# Patient Record
Sex: Female | Born: 1970 | Race: White | Hispanic: No | Smoking: Never smoker
Health system: Southern US, Community
[De-identification: ages and names within clinical notes are randomized; demographics above are authoritative.]

## PROBLEM LIST (undated history)

## (undated) DIAGNOSIS — R519 Headache, unspecified: Secondary | ICD-10-CM

## (undated) DIAGNOSIS — M542 Cervicalgia: Secondary | ICD-10-CM

## (undated) DIAGNOSIS — M5430 Sciatica, unspecified side: Secondary | ICD-10-CM

## (undated) DIAGNOSIS — R51 Headache: Secondary | ICD-10-CM

## (undated) DIAGNOSIS — G8929 Other chronic pain: Secondary | ICD-10-CM

## (undated) DIAGNOSIS — E119 Type 2 diabetes mellitus without complications: Secondary | ICD-10-CM

## (undated) DIAGNOSIS — J45909 Unspecified asthma, uncomplicated: Secondary | ICD-10-CM

## (undated) DIAGNOSIS — E079 Disorder of thyroid, unspecified: Secondary | ICD-10-CM

## (undated) DIAGNOSIS — M549 Dorsalgia, unspecified: Secondary | ICD-10-CM

## (undated) HISTORY — PX: TUBAL LIGATION: SHX77

## (undated) HISTORY — PX: CHOLECYSTECTOMY: SHX55

---

## 1998-08-13 ENCOUNTER — Emergency Department (HOSPITAL_COMMUNITY): Admission: EM | Admit: 1998-08-13 | Discharge: 1998-08-13 | Payer: Self-pay | Admitting: Emergency Medicine

## 1998-09-01 ENCOUNTER — Emergency Department (HOSPITAL_COMMUNITY): Admission: EM | Admit: 1998-09-01 | Discharge: 1998-09-01 | Payer: Self-pay | Admitting: Emergency Medicine

## 1998-11-08 ENCOUNTER — Emergency Department (HOSPITAL_COMMUNITY): Admission: EM | Admit: 1998-11-08 | Discharge: 1998-11-08 | Payer: Self-pay | Admitting: Emergency Medicine

## 1999-12-26 ENCOUNTER — Emergency Department (HOSPITAL_COMMUNITY): Admission: EM | Admit: 1999-12-26 | Discharge: 1999-12-26 | Payer: Self-pay | Admitting: Emergency Medicine

## 2000-01-28 ENCOUNTER — Emergency Department (HOSPITAL_COMMUNITY): Admission: EM | Admit: 2000-01-28 | Discharge: 2000-01-28 | Payer: Self-pay | Admitting: Emergency Medicine

## 2000-01-28 ENCOUNTER — Encounter: Payer: Self-pay | Admitting: Emergency Medicine

## 2000-03-11 ENCOUNTER — Other Ambulatory Visit: Admission: RE | Admit: 2000-03-11 | Discharge: 2000-03-11 | Payer: Self-pay | Admitting: Obstetrics and Gynecology

## 2000-04-22 ENCOUNTER — Encounter: Payer: Self-pay | Admitting: Obstetrics and Gynecology

## 2000-04-22 ENCOUNTER — Ambulatory Visit (HOSPITAL_COMMUNITY): Admission: RE | Admit: 2000-04-22 | Discharge: 2000-04-22 | Payer: Self-pay | Admitting: Obstetrics and Gynecology

## 2000-05-01 ENCOUNTER — Encounter: Payer: Self-pay | Admitting: Obstetrics and Gynecology

## 2000-05-01 ENCOUNTER — Ambulatory Visit (HOSPITAL_COMMUNITY): Admission: RE | Admit: 2000-05-01 | Discharge: 2000-05-01 | Payer: Self-pay | Admitting: Obstetrics and Gynecology

## 2000-07-22 ENCOUNTER — Inpatient Hospital Stay (HOSPITAL_COMMUNITY): Admission: AD | Admit: 2000-07-22 | Discharge: 2000-07-22 | Payer: Self-pay | Admitting: Obstetrics and Gynecology

## 2000-07-23 ENCOUNTER — Encounter: Payer: Self-pay | Admitting: Obstetrics and Gynecology

## 2000-07-23 ENCOUNTER — Inpatient Hospital Stay (HOSPITAL_COMMUNITY): Admission: AD | Admit: 2000-07-23 | Discharge: 2000-07-23 | Payer: Self-pay | Admitting: Obstetrics and Gynecology

## 2000-08-02 ENCOUNTER — Inpatient Hospital Stay (HOSPITAL_COMMUNITY): Admission: AD | Admit: 2000-08-02 | Discharge: 2000-08-02 | Payer: Self-pay | Admitting: Obstetrics and Gynecology

## 2000-08-08 ENCOUNTER — Inpatient Hospital Stay (HOSPITAL_COMMUNITY): Admission: AD | Admit: 2000-08-08 | Discharge: 2000-08-08 | Payer: Self-pay | Admitting: Obstetrics and Gynecology

## 2000-08-17 ENCOUNTER — Inpatient Hospital Stay (HOSPITAL_COMMUNITY): Admission: AD | Admit: 2000-08-17 | Discharge: 2000-08-17 | Payer: Self-pay | Admitting: Obstetrics and Gynecology

## 2000-08-21 ENCOUNTER — Inpatient Hospital Stay (HOSPITAL_COMMUNITY): Admission: AD | Admit: 2000-08-21 | Discharge: 2000-08-21 | Payer: Self-pay | Admitting: Obstetrics and Gynecology

## 2000-08-30 ENCOUNTER — Inpatient Hospital Stay (HOSPITAL_COMMUNITY): Admission: AD | Admit: 2000-08-30 | Discharge: 2000-08-30 | Payer: Self-pay | Admitting: Obstetrics and Gynecology

## 2000-09-03 ENCOUNTER — Other Ambulatory Visit: Admission: RE | Admit: 2000-09-03 | Discharge: 2000-09-03 | Payer: Self-pay | Admitting: Obstetrics and Gynecology

## 2000-09-06 ENCOUNTER — Inpatient Hospital Stay (HOSPITAL_COMMUNITY): Admission: AD | Admit: 2000-09-06 | Discharge: 2000-09-06 | Payer: Self-pay | Admitting: Obstetrics and Gynecology

## 2000-09-08 ENCOUNTER — Inpatient Hospital Stay (HOSPITAL_COMMUNITY): Admission: AD | Admit: 2000-09-08 | Discharge: 2000-09-08 | Payer: Self-pay | Admitting: Obstetrics and Gynecology

## 2000-09-13 ENCOUNTER — Inpatient Hospital Stay (HOSPITAL_COMMUNITY): Admission: AD | Admit: 2000-09-13 | Discharge: 2000-09-16 | Payer: Self-pay | Admitting: Obstetrics and Gynecology

## 2000-10-30 ENCOUNTER — Other Ambulatory Visit: Admission: RE | Admit: 2000-10-30 | Discharge: 2000-10-30 | Payer: Self-pay | Admitting: Obstetrics and Gynecology

## 2000-12-17 ENCOUNTER — Emergency Department (HOSPITAL_COMMUNITY): Admission: EM | Admit: 2000-12-17 | Discharge: 2000-12-17 | Payer: Self-pay | Admitting: Emergency Medicine

## 2001-05-04 ENCOUNTER — Other Ambulatory Visit: Admission: RE | Admit: 2001-05-04 | Discharge: 2001-05-04 | Payer: Self-pay | Admitting: Obstetrics and Gynecology

## 2001-06-24 ENCOUNTER — Encounter: Payer: Self-pay | Admitting: Obstetrics and Gynecology

## 2001-06-24 ENCOUNTER — Ambulatory Visit (HOSPITAL_COMMUNITY): Admission: RE | Admit: 2001-06-24 | Discharge: 2001-06-24 | Payer: Self-pay | Admitting: Obstetrics and Gynecology

## 2001-10-04 ENCOUNTER — Inpatient Hospital Stay (HOSPITAL_COMMUNITY): Admission: AD | Admit: 2001-10-04 | Discharge: 2001-10-04 | Payer: Self-pay | Admitting: Obstetrics and Gynecology

## 2001-10-14 ENCOUNTER — Inpatient Hospital Stay (HOSPITAL_COMMUNITY): Admission: AD | Admit: 2001-10-14 | Discharge: 2001-10-14 | Payer: Self-pay | Admitting: Obstetrics and Gynecology

## 2001-10-31 ENCOUNTER — Inpatient Hospital Stay (HOSPITAL_COMMUNITY): Admission: AD | Admit: 2001-10-31 | Discharge: 2001-11-03 | Payer: Self-pay | Admitting: Obstetrics and Gynecology

## 2001-12-08 ENCOUNTER — Other Ambulatory Visit: Admission: RE | Admit: 2001-12-08 | Discharge: 2001-12-08 | Payer: Self-pay | Admitting: Obstetrics and Gynecology

## 2002-01-15 ENCOUNTER — Ambulatory Visit (HOSPITAL_COMMUNITY): Admission: RE | Admit: 2002-01-15 | Discharge: 2002-01-15 | Payer: Self-pay | Admitting: Obstetrics and Gynecology

## 2002-02-01 ENCOUNTER — Emergency Department (HOSPITAL_COMMUNITY): Admission: EM | Admit: 2002-02-01 | Discharge: 2002-02-01 | Payer: Self-pay | Admitting: Emergency Medicine

## 2002-03-04 ENCOUNTER — Emergency Department (HOSPITAL_COMMUNITY): Admission: EM | Admit: 2002-03-04 | Discharge: 2002-03-04 | Payer: Self-pay | Admitting: Emergency Medicine

## 2002-05-02 ENCOUNTER — Emergency Department (HOSPITAL_COMMUNITY): Admission: EM | Admit: 2002-05-02 | Discharge: 2002-05-03 | Payer: Self-pay | Admitting: Emergency Medicine

## 2002-10-13 ENCOUNTER — Emergency Department (HOSPITAL_COMMUNITY): Admission: EM | Admit: 2002-10-13 | Discharge: 2002-10-14 | Payer: Self-pay | Admitting: Emergency Medicine

## 2002-10-14 ENCOUNTER — Encounter: Payer: Self-pay | Admitting: Emergency Medicine

## 2003-06-25 ENCOUNTER — Emergency Department (HOSPITAL_COMMUNITY): Admission: EM | Admit: 2003-06-25 | Discharge: 2003-06-25 | Payer: Self-pay

## 2003-06-26 ENCOUNTER — Emergency Department (HOSPITAL_COMMUNITY): Admission: EM | Admit: 2003-06-26 | Discharge: 2003-06-26 | Payer: Self-pay

## 2003-11-10 ENCOUNTER — Emergency Department (HOSPITAL_COMMUNITY): Admission: EM | Admit: 2003-11-10 | Discharge: 2003-11-10 | Payer: Self-pay | Admitting: Emergency Medicine

## 2003-11-12 ENCOUNTER — Emergency Department (HOSPITAL_COMMUNITY): Admission: EM | Admit: 2003-11-12 | Discharge: 2003-11-12 | Payer: Self-pay | Admitting: Emergency Medicine

## 2004-01-07 ENCOUNTER — Emergency Department (HOSPITAL_COMMUNITY): Admission: EM | Admit: 2004-01-07 | Discharge: 2004-01-07 | Payer: Self-pay | Admitting: *Deleted

## 2004-04-27 ENCOUNTER — Emergency Department (HOSPITAL_COMMUNITY): Admission: EM | Admit: 2004-04-27 | Discharge: 2004-04-27 | Payer: Self-pay | Admitting: Emergency Medicine

## 2005-04-05 ENCOUNTER — Emergency Department (HOSPITAL_COMMUNITY): Admission: EM | Admit: 2005-04-05 | Discharge: 2005-04-05 | Payer: Self-pay | Admitting: Emergency Medicine

## 2005-10-18 ENCOUNTER — Emergency Department (HOSPITAL_COMMUNITY): Admission: EM | Admit: 2005-10-18 | Discharge: 2005-10-18 | Payer: Self-pay | Admitting: Emergency Medicine

## 2005-12-12 ENCOUNTER — Emergency Department (HOSPITAL_COMMUNITY): Admission: EM | Admit: 2005-12-12 | Discharge: 2005-12-12 | Payer: Self-pay | Admitting: Emergency Medicine

## 2006-02-10 ENCOUNTER — Emergency Department (HOSPITAL_COMMUNITY): Admission: EM | Admit: 2006-02-10 | Discharge: 2006-02-10 | Payer: Self-pay | Admitting: Emergency Medicine

## 2007-03-27 ENCOUNTER — Emergency Department (HOSPITAL_COMMUNITY): Admission: EM | Admit: 2007-03-27 | Discharge: 2007-03-27 | Payer: Self-pay | Admitting: Emergency Medicine

## 2007-05-09 ENCOUNTER — Emergency Department (HOSPITAL_COMMUNITY): Admission: EM | Admit: 2007-05-09 | Discharge: 2007-05-10 | Payer: Self-pay | Admitting: Emergency Medicine

## 2007-07-09 ENCOUNTER — Emergency Department (HOSPITAL_COMMUNITY): Admission: EM | Admit: 2007-07-09 | Discharge: 2007-07-10 | Payer: Self-pay | Admitting: Emergency Medicine

## 2007-07-11 ENCOUNTER — Emergency Department (HOSPITAL_COMMUNITY): Admission: EM | Admit: 2007-07-11 | Discharge: 2007-07-11 | Payer: Self-pay | Admitting: Emergency Medicine

## 2007-07-19 ENCOUNTER — Emergency Department (HOSPITAL_COMMUNITY): Admission: EM | Admit: 2007-07-19 | Discharge: 2007-07-19 | Payer: Self-pay | Admitting: Emergency Medicine

## 2008-06-20 ENCOUNTER — Emergency Department (HOSPITAL_COMMUNITY): Admission: EM | Admit: 2008-06-20 | Discharge: 2008-06-20 | Payer: Self-pay | Admitting: Emergency Medicine

## 2010-05-08 LAB — URINALYSIS, ROUTINE W REFLEX MICROSCOPIC
Hgb urine dipstick: NEGATIVE
Specific Gravity, Urine: 1.023 (ref 1.005–1.030)
Urobilinogen, UA: 0.2 mg/dL (ref 0.0–1.0)
pH: 6 (ref 5.0–8.0)

## 2010-05-08 LAB — URINE MICROSCOPIC-ADD ON

## 2010-05-08 LAB — DIFFERENTIAL
Basophils Absolute: 0 10*3/uL (ref 0.0–0.1)
Basophils Relative: 0 % (ref 0–1)
Eosinophils Absolute: 0.2 10*3/uL (ref 0.0–0.7)
Eosinophils Relative: 2 % (ref 0–5)
Lymphs Abs: 2.8 10*3/uL (ref 0.7–4.0)
Neutrophils Relative %: 61 % (ref 43–77)

## 2010-05-08 LAB — COMPREHENSIVE METABOLIC PANEL
ALT: 14 U/L (ref 0–35)
AST: 23 U/L (ref 0–37)
Alkaline Phosphatase: 70 U/L (ref 39–117)
CO2: 21 mEq/L (ref 19–32)
Calcium: 8.5 mg/dL (ref 8.4–10.5)
Chloride: 107 mEq/L (ref 96–112)
GFR calc Af Amer: >60 mL/min (ref >60)
GFR calc non Af Amer: >60 mL/min (ref >60)
Glucose, Bld: 106 mg/dL — ABNORMAL HIGH (ref 70–99)
Potassium: 3.9 mEq/L (ref 3.5–5.1)
Sodium: 137 mEq/L (ref 135–145)
Total Bilirubin: 0.5 mg/dL (ref 0.3–1.2)

## 2010-05-08 LAB — CBC
Hemoglobin: 11.8 g/dL — ABNORMAL LOW (ref 12.0–15.0)
MCHC: 33.8 g/dL (ref 30.0–36.0)
RBC: 3.88 MIL/uL (ref 3.87–5.11)
WBC: 8.7 10*3/uL (ref 4.0–10.5)

## 2010-05-08 LAB — URINE CULTURE

## 2010-05-08 LAB — POCT PREGNANCY, URINE: Preg Test, Ur: NEGATIVE

## 2010-05-08 LAB — POCT CARDIAC MARKERS: Troponin i, poc: <0.05 ng/mL (ref 0.00–0.09)

## 2010-06-15 NOTE — Discharge Summary (Signed)
[name], [name] NO.:  [id number]   MEDICAL RECORD NO.:  [id number]                   PATIENT TYPE:  INP   LOCATION:  9137                                 FACILITY:  [facility]   PHYSICIAN:  Malachi Pro. Ambrose Mantle, M.D.              DATE OF BIRTH:  05-Apr-1970   DATE OF ADMISSION:  10/31/2001  DATE OF DISCHARGE:  11/03/2001                                 DISCHARGE SUMMARY   HISTORY OF PRESENT ILLNESS:  A 40 year old white female para 2-1-1-2,  gravida 5.  Estimated gestational age [redacted] weeks by 9 week ultrasound with [facility]  November 21, 2001 presented complaining of contractions and bloody show.  Blood group and type O+ with a negative antibody.  RPR nonreactive.  Rubella  immune.  Hepatitis B surface antigen negative.  HIV negative.  GC negative.  Chlamydia positive.  Triple screen normal.  One hour Glucola 35.  Group B  Strep positive.  The patient's prenatal course is outlined in the history  and physical.  After admission to the hospital she was given IV Zithromax  for positive Chlamydia that she had never treated.  She was then begun on  penicillin for positive group B Strep status.  At 1:15 p.m. on October 31, 2001 the cervix was 7 cm, right mentum posterior presentation.  Artificial  rupture of the membranes produced copious clear fluid.  By 2:15 p.m. the  heart rate showed severe variables with contractions.  Vaginal examination  was unchanged.  Amnioinfusion was done.  Epidural was given.  By 4:55 p.m.  the cervix was completely dilated since 3:30 p.m.  The cervix was 10 cm.  The mentum was still posterior.  It did not change with pushing.  Dr.  Jackelyn Knife proceeded to cesarean section and noticed a ballooning lower  uterine segment.  Right lower uterus was adherent to the anterior abdominal  wall.  The infant was 6 pounds 4 ounces, female, Apgars of 8 at one and 9 at  five minutes.  Postpartum the patient did well.  Her hemoglobin did drop  from 11.8 to 7.6  but it remained stable and she was asymptomatic.  She was  seen in consultation by social work who felt the patient had a complicated  home situation, but no real needs to be addressed during the hospital stay.  On the third postpartum day the patient was doing well.  Her abdomen was  soft and nontender.  She was passing flatus, ambulating well without  difficulty.  Staples removed.  Strips were applied and she was ready for  discharge.   LABORATORY DATA:  Hemoglobin 11.8, hematocrit 34.2, white count 8300,  platelet count 299,000 on admission.  Follow-up hemoglobin 7.6 and 7.8.  RPR  was nonreactive.   FINAL DIAGNOSES:  1. Intrauterine pregnancy 37 weeks.  2. Right mentum posterior.  3. Positive Chlamydia.  4. Positive group  B Strep.   OPERATION:  Low transverse cervical cesarean section.   FINAL CONDITION:  Improved.   DISCHARGE INSTRUCTIONS:  Regular discharge instruction booklet.  The patient  is given prescriptions for Chromagen one tablet q.d., Percocet 5/325 20  tablets one q.4-6h. as needed for pain.  She is advised to return to the  office in 10 days for follow-up examination.  She also had an abnormal Pap  smear during the pregnancy that could not be evaluated adequately with  colposcopy because the cervix was difficult to visualize in the right  anterior fornix.                                              Malachi Pro. Ambrose Mantle, M.D.   TFH/MEDQ  D:  11/03/2001  T:  11/03/2001  Job:  [id number]

## 2010-06-15 NOTE — Discharge Summary (Signed)
[facility]  PatientCOLA, [name] Visit Number: [id number] MRN: [id number]          Service Type: OBS Location: 910A 9141 [id number] Attending Physician:  Michaele Offer Adm. Date:  09/13/2000 Disc. Date: 09/16/00                             Discharge Summary  ADMISSION DIAGNOSES:          1. Intrauterine pregnancy at 39 weeks.                               2. Prior cesarean section.                               3. Group B streptococcus carrier.                               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  DISCHARGE DIAGNOSES:          1. Intrauterine pregnancy at 39 weeks.                               2. Prior cesarean section.                               3. Group B streptococcus carrier.                               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  PROCEDURES:                   Vacuum assisted vaginal birth after                               cesarean.  COMPLICATIONS:                None.  CONSULTATIONS:                None.  HISTORY AND PHYSICAL:         This is a 40 year old white female, gravida 4, para 1-1-1-2, with an EGA of [redacted] weeks by a 10-week ultrasound with a due date of August 23 who presented with a complaint of a mucous discharge and contractions without bleeding and with good fetal movement. Evaluation in Maternity Admissions revealed positive Nitrazine x 2 per the nurse consistent with ruptured membranes and she was admitted, at which time her vaginal exam was 1+/50/-2 with a vertex presentation. Prenatal care was complicated by positive Chlamydia treated with Zithromax with a negative test of cure. She had an abnormal Pap smear with CIN-1 by colposcopy. She had cystitis at 18 weeks treated with Macrobid and was a positive cystic fibrosis carrier by screening and the father of the baby declined testing. She had normal three-hour glucose tolerance test x 2 for a history of gestational diabetes. She did have group B strep  in her urine at 28 weeks treated with Macrobid and penicillin and was retreated with this at 34 weeks with ampicillin. She had yeast at 28 weeks treated  with Terazol and was seen in the emergency room at 30 weeks for right lower quadrant pain for which no source was found. She has had frequent trips to Maternity Admissions for preterm and term contractions.  PRENATAL LABORATORY DATA:     Blood type is O positive with a negative antibody screen, RPR nonreactive, rubella nonimmune, hepatitis B surface antigen  negative, HIV negative, gonorrhea negative, group B strep was positive.  PAST OBSTETRICAL HISTORY:     In March of 1992, vaginal delivery at 36 weeks. The baby weighed 2 pounds and died at approximately five hours of life from a possible chromosomal abnormality. Later in 1992, she had a spontaneous abortion treated with a D&C. In 1993 she had a low transverse cesarean section at 40 weeks for failure to progress and gestational diabetes and the baby weighed 8 pounds 9 ounces.  PAST SURGICAL HISTORY:        Cesarean section, myringotomy with tubes, and cholecystectomy.  PHYSICAL EXAMINATION:         She was afebrile with stable vital signs. Fetal heart rate was reassuring and she developed regular contractions. The abdomen was gravid, nontender, with a fundal height of 37 cm a week prior to admission, and she has a well-healed transverse scar. Admission vaginal exam, again, was 1+/50/-2.  HOSPITAL COURSE:              The patient was admitted with what we felt was ruptured membranes in early labor, possibly. She was started on penicillin for being a group B strep carrier. She continued to contract on her own. Even though a nurse on labor and delivery palpated a bulging bag of water, she did enter labor on her own. She entered active labor and received an epidural. She also had spontaneous rupture of membranes with blood-tinged fluid and continued to progress. On my first exam she  was complete/complete/0. She pushed fair but the fetal heart rate tracing went down to the 70s to 80s for approximately two minutes with each push. At that time, she had pushed the baby to a +2 station and assisted delivery was recommended. Vacuum assisted vaginal delivery was performed without complications. She delivered a viable female with Apgars of 8 and 9 that weighed 5 pounds 12 ounces over an intact perineum. Placenta delivered spontaneous and was intact. She had bilateral labial lacerations and a small vaginal laceration which were hemostatic and not repaired. Her C-section scar palpated intact and estimated blood loss was less than 500 cc.  Postpartum she did very well, breast fed her baby without complications, and remained afebrile. Predelivery hemoglobin 12.9, postdelivery 10.2.  On the morning of postpartum day #2 she was felt to be stable enough for discharge home.  CONDITION ON DISCHARGE:       Stable.  DISPOSITION:                  Discharge to home.  DISCHARGE INSTRUCTIONS:       Diet: Regular. Activity: Pelvic rest. She is given our discharge pamphlet.  DISCHARGE FOLLOWUP:           The patient is to follow up in four to six weeks.  DISCHARGE MEDICATIONS:        Percocet p.r.n. as well as over-the-counter Motrin or Aleve p.r.n. Attending Physician:  Michaele Offer DD:  09/16/00 TD:  09/16/00 Job: [phone] [id number]

## 2010-06-15 NOTE — Op Note (Signed)
[name], [facility]                                  MRN:  [id number]           Operative Report                                  ATT:  Zenaida Niece, M.D.  Procedure:  09/14/00             DICT: PROCEDURE IN DETAIL:          This patient progressed through labor and reached completely dilated. She pushed for approximately 30 minutes. She was pushing fair. With each push, the fetal heart rate would decelerate to the 70s to 80s for approximately two minutes and would recover just in time for the next push. Due to these decelerations, I discussed the delivery with the patient and offered a vacuum delivery. Risks of the procedure were discussed and the patient agreed to proceed. Foley catheter had been removed approximately 20 minutes prior so her bladder was deemed to be empty. The fetal vertex was at +2 to +3 station and I was not able to determine the position. She had adequate epidural anesthesia. The M-cup vacuum was applied and placement confirmed. With her next contraction, with three to four pushes, we were able to deliver the vertex over an intact perineum. At this point, the vacuum was removed and the mouth and nares were suctioned. There was noted to be terminal meconium. She then delivered the remainder of a viable female infant with Apgars of 8 and 9 with a weight pending at the time of dictation. Again, the baby cried spontaneously and there appeared to be very dilute terminal meconium. This was accomplished over an intact perineum. The placenta then delivered spontaneous and was intact. Her uterus was palpated and her previous cesarean scar was found to be intact. Pitocin was then started. Bilateral labial lacerations and a small vaginal laceration were hemostatic and not repaired. Estimated blood loss was less than 500 cc. Baby and mom were doing well at the end of the procedure. DD:  09/14/00 TD:  09/15/00 Job:  [id number]

## 2010-06-15 NOTE — Op Note (Signed)
[name], [name]                              ACCOUNT NO.:  [id number]   MEDICAL RECORD NO.:  [id number]                   PATIENT TYPE:  AMB   LOCATION:  DAY                                  FACILITY:  [facility]   PHYSICIAN:  Zenaida Niece, M.D.             DATE OF BIRTH:  1970-12-31   DATE OF PROCEDURE:  01/15/2002  DATE OF DISCHARGE:                                 OPERATIVE REPORT   PREOPERATIVE DIAGNOSIS:  Desires surgical sterility.   POSTOPERATIVE DIAGNOSES:  1. Desires surgical sterility.  2. Pelvic adhesions.   PROCEDURE:  Laparoscopic bilateral tubal fulguration and adhesiolysis.   SURGEON:  Zenaida Niece, M.D.   ANESTHESIA:  General endotracheal tube.   ESTIMATED BLOOD LOSS:  Less than 50 cc.   FINDINGS:  Very short anterior cervix.  The uterus was adherent to the  anterior abdominal wall with a thick adhesion.  The right ovary was slightly  enlarged with the tube adherent to it.  Left tube and ovary appeared normal.  The omentum was adherent to the anterior abdominal wall in several places.   PROCEDURE IN DETAIL:  The patient was taken to the operating room and placed  in the dorsal supine position.  General anesthesia was induced, and she was  placed in mobile stirrups.  The abdomen was prepped and draped in the usual  sterile fashion, and I attempted to place a Hulka tenaculum on the cervix  but due to the fact that it was so short, I was unable to place this.  A  sponge stick was placed in the vagina for possible uterine manipulation.  Her infraumbilical skin was then infiltrated with 0.25% Marcaine, and a 1.5  cm horizontal incision was made.  The Veress needle was inserted in the  peritoneal cavity and placement confirmed by an opening pressure of 2 mmHg.  The abdomen was insufflated with CO2 gas, and the Veress needle was removed.  The 10-11 disposable trocar was then introduced and placement confirmed by  the laparoscope.  Inspection revealed the  above-mentioned findings.  Some of  the omental adhesions were in the midline going down to the midline uterine  adhesion and also to the left of it.  These were taken down bluntly and with  bipolar cautery to get blood vessels and scissors for sharp dissection.  This adhesion was taken down entirely from the umbilicus to the lower  pelvis.  The right fallopian tube was identified and traced to its  fimbriated end.  It was noted to be significantly adherent to the right  ovary.  Three segments of tube were desiccated with bipolar cautery to  achieve tubal fulguration with adequate hemostasis and adequate dessication  where the amp meter read zero at all spots desiccated.  The left fallopian  tube, which was posterior, was identified and traced to its fimbriated end.  Three segments of this tube  were fulgurated with bipolar cautery until the  amp meter read zero.  This appeared to achieve good dessication of the tube.  The anterior adhesions of the uterus to the anterior abdominal wall was  taken down somewhat bluntly and somewhat with bipolar cautery.  However, it  proved to be fairly thick and due to the fact that she was having no  symptoms and it looked like this might bleed significantly, I only took it  down a short ways.  Bleeding was controlled with bipolar cautery.  The  pelvis was copiously irrigated and several bleeding areas made hemostatic  with the bipolar electrocautery.  I had irrigated the pelvis with  approximately 1500 cc of fluid.  I was able to aspirate approximately 750 cc  to 800 cc of it back out through the suction irrigator.  The pelvis was  again inspected and all sites found to be hemostatic.  She also had a normal-  appearing appendix.  All gas was allowed to deflate from the abdomen, and  the trocar was removed.  The skin was closed with interrupted subcuticular  sutures of 4-0 Vicryl followed by Steri-Strips and band-aid.  The patient  was extubated in the  operating room and tolerated the procedure well and was  taken to the recovery room in stable condition.                                               Zenaida Niece, M.D.    TDM/MEDQ  D:  01/15/2002  T:  01/16/2002  Job:  [id number]

## 2010-06-15 NOTE — Op Note (Signed)
NAMEHAILLEE, [name] NO.:  [id number]   MEDICAL RECORD NO.:  [id number]                   PATIENT TYPE:  INP   LOCATION:  9137                                 FACILITY:  [facility]   PHYSICIAN:  Zenaida Niece, M.D.             DATE OF BIRTH:  1970/03/29   DATE OF PROCEDURE:  10/31/2001  DATE OF DISCHARGE:                                 OPERATIVE REPORT   PREOPERATIVE DIAGNOSES:  1. Intrauterine pregnancy at 37 weeks.  2. Face presentation.  3. Arrest of descent.  4. Prior cesarean section.   POSTOPERATIVE DIAGNOSES:  1. Intrauterine pregnancy at 37 weeks.  2. Face presentation.  3. Arrest of descent.  4. Prior cesarean section.   PROCEDURE:  Repeat low transverse cesarean section.   SURGEON:  Zenaida Niece, M.D.   ASSISTANT:  Chester Holstein. Earlene Plater, M.D.   ANESTHESIA:  Epidural.   ESTIMATED BLOOD LOSS:  800 cc.   FINDINGS:  She had a ballooning lower uterine segment and the right lower  uterus was adherent to the anterior abdominal wall.  She delivered a viable  female infant with Apgars of 8 and 9, weight 6 pounds 4 ounces.   DESCRIPTION OF PROCEDURE:  The patient was taken to the operating room and  placed in the dorsal supine position.  Her previously placed epidural was  dosed appropriately, and her abdomen was prepped and draped in the usual  sterile fashion.  The level of her anesthesia was found to be adequate and  her abdomen was entered via her previous Pfannenstiel incision.  There was  evidence of scarring.  Upon entering the peritoneal cavity, the right lower  uterus was adherent to the anterior abdominal wall.  Some of this was taken  down with electrocautery but I was able to place the bladder blade and  create a bladder flap without taking down the rest of this adhesion.  Again,  the lower uterine segment was noted to be ballooning and a 4 cm transverse  incision was made in this.  The incision was extended bilaterally  digitally  and for a short distance with bandage scissors.  The fetal vertex was  grasped and delivered through the incision atraumatically.  There was  swelling noted from the face presentation.  The mouth and nares were  suctioned, and the remainder of the infant was delivered atraumatically.  The cord was then doubly clamped and cut and the infant handed to the  awaiting pediatric team.  Cord blood and a segment of cord for gas were  obtained, and the placenta delivered spontaneously.  The uterus was wiped  dry with  clean lap pad and all clots and debris removed.  The uterus was  inspected and found to have no extensions although the incision did go out  laterally on the right side.  The uterus was then closed in one layer  being  a running interlocking layer with #1 chromic.  This started at both angles  and worked towards the middle.  Bleeding from the inferior portion on the  left side was controlled with several figure-of-eight sutures of #1 chromic  and 3-0 Vicryl to eventually achieve hemostasis.  Both tubes and ovaries  were palpated normal.  The adhesions on the uterus to the right anterior  abdominal wall was not taken down.  The subfascial space was irrigated and  made hemostatic with electrocautery.  The fascia was then closed in a  running fashion starting at both ends and meeting in the middle with 0  Vicryl.  The subcutaneous tissue was irrigated and made hemostatic with  electrocautery and the skin was closed with staples and as sterile dressing.  The patient tolerated the procedure well and was taken to the recovery room  in stable condition.  The patient was given Ancef 1 g after cord clamping.  Counts were correct.                                               Zenaida Niece, M.D.    TDM/MEDQ  D:  10/31/2001  T:  11/01/2001  Job:  [id number]

## 2010-07-02 ENCOUNTER — Emergency Department (HOSPITAL_COMMUNITY): Payer: Self-pay

## 2010-07-02 ENCOUNTER — Emergency Department (HOSPITAL_COMMUNITY)
Admission: EM | Admit: 2010-07-02 | Discharge: 2010-07-02 | Disposition: A | Discharge status: Home / Self Care | Payer: Self-pay | Attending: Emergency Medicine | Admitting: Emergency Medicine

## 2010-07-02 DIAGNOSIS — M79609 Pain in unspecified limb: Secondary | ICD-10-CM | POA: Insufficient documentation

## 2010-07-02 DIAGNOSIS — M7989 Other specified soft tissue disorders: Secondary | ICD-10-CM | POA: Insufficient documentation

## 2010-09-05 ENCOUNTER — Emergency Department (HOSPITAL_COMMUNITY)
Admission: EM | Admit: 2010-09-05 | Discharge: 2010-09-05 | Disposition: A | Discharge status: Home / Self Care | Payer: Self-pay | Attending: Emergency Medicine | Admitting: Emergency Medicine

## 2010-09-05 DIAGNOSIS — K029 Dental caries, unspecified: Secondary | ICD-10-CM | POA: Insufficient documentation

## 2010-09-05 DIAGNOSIS — K089 Disorder of teeth and supporting structures, unspecified: Secondary | ICD-10-CM | POA: Insufficient documentation

## 2010-10-13 ENCOUNTER — Emergency Department (HOSPITAL_COMMUNITY): Payer: Self-pay

## 2010-10-13 ENCOUNTER — Emergency Department (HOSPITAL_COMMUNITY)
Admission: EM | Admit: 2010-10-13 | Discharge: 2010-10-13 | Disposition: A | Discharge status: Home / Self Care | Payer: Self-pay | Attending: Emergency Medicine | Admitting: Emergency Medicine

## 2010-10-13 DIAGNOSIS — B9689 Other specified bacterial agents as the cause of diseases classified elsewhere: Secondary | ICD-10-CM | POA: Insufficient documentation

## 2010-10-13 DIAGNOSIS — R1032 Left lower quadrant pain: Secondary | ICD-10-CM | POA: Insufficient documentation

## 2010-10-13 DIAGNOSIS — N760 Acute vaginitis: Secondary | ICD-10-CM | POA: Insufficient documentation

## 2010-10-13 DIAGNOSIS — E039 Hypothyroidism, unspecified: Secondary | ICD-10-CM | POA: Insufficient documentation

## 2010-10-13 DIAGNOSIS — A499 Bacterial infection, unspecified: Secondary | ICD-10-CM | POA: Insufficient documentation

## 2010-10-13 DIAGNOSIS — N949 Unspecified condition associated with female genital organs and menstrual cycle: Secondary | ICD-10-CM | POA: Insufficient documentation

## 2010-10-13 LAB — URINALYSIS, ROUTINE W REFLEX MICROSCOPIC
Glucose, UA: NEGATIVE mg/dL
Protein, ur: 100 mg/dL — AB
Specific Gravity, Urine: 1.028 (ref 1.005–1.030)
Urobilinogen, UA: 0.2 mg/dL (ref 0.0–1.0)

## 2010-10-13 LAB — POCT I-STAT, CHEM 8
BUN: 15 mg/dL (ref 6–23)
Chloride: 107 mEq/L (ref 96–112)
Potassium: 4.1 mEq/L (ref 3.5–5.1)
Sodium: 139 mEq/L (ref 135–145)

## 2010-10-13 LAB — WET PREP, GENITAL

## 2010-10-13 LAB — URINE MICROSCOPIC-ADD ON

## 2010-10-13 MED ORDER — IOHEXOL 300 MG/ML  SOLN
100.0000 mL | Freq: Once | INTRAMUSCULAR | Status: AC | PRN
  Administered 2010-10-13: 100 mL via INTRAVENOUS

## 2010-10-15 LAB — URINE CULTURE
Colony Count: >=100000
Culture  Setup Time: 201209151729

## 2010-10-15 LAB — GC/CHLAMYDIA PROBE AMP, GENITAL: Chlamydia, DNA Probe: NEGATIVE

## 2010-10-23 LAB — DIFFERENTIAL
Basophils Relative: 1
Eosinophils Absolute: 0.2
Lymphs Abs: 2.8
Monocytes Absolute: 0.5
Monocytes Relative: 6
Neutro Abs: 3.9
Neutrophils Relative %: 53

## 2010-10-23 LAB — URINALYSIS, ROUTINE W REFLEX MICROSCOPIC
Bilirubin Urine: NEGATIVE
Glucose, UA: NEGATIVE
Hgb urine dipstick: NEGATIVE
Ketones, ur: NEGATIVE
Protein, ur: NEGATIVE
Urobilinogen, UA: 0.2

## 2010-10-23 LAB — CBC
Hemoglobin: 12
MCHC: 34.2
MCV: 89.2
RBC: 3.94
WBC: 7.5

## 2010-10-23 LAB — URINE MICROSCOPIC-ADD ON

## 2012-01-03 ENCOUNTER — Encounter (HOSPITAL_COMMUNITY): Payer: Self-pay | Admitting: Emergency Medicine

## 2012-01-03 ENCOUNTER — Emergency Department (HOSPITAL_COMMUNITY)
Admission: EM | Admit: 2012-01-03 | Discharge: 2012-01-03 | Disposition: A | Discharge status: Home / Self Care | Payer: Self-pay | Attending: Emergency Medicine | Admitting: Emergency Medicine

## 2012-01-03 DIAGNOSIS — E079 Disorder of thyroid, unspecified: Secondary | ICD-10-CM | POA: Insufficient documentation

## 2012-01-03 DIAGNOSIS — Z79899 Other long term (current) drug therapy: Secondary | ICD-10-CM | POA: Insufficient documentation

## 2012-01-03 DIAGNOSIS — K089 Disorder of teeth and supporting structures, unspecified: Secondary | ICD-10-CM | POA: Insufficient documentation

## 2012-01-03 DIAGNOSIS — E119 Type 2 diabetes mellitus without complications: Secondary | ICD-10-CM | POA: Insufficient documentation

## 2012-01-03 DIAGNOSIS — K0889 Other specified disorders of teeth and supporting structures: Secondary | ICD-10-CM

## 2012-01-03 DIAGNOSIS — J45909 Unspecified asthma, uncomplicated: Secondary | ICD-10-CM | POA: Insufficient documentation

## 2012-01-03 HISTORY — DX: Type 2 diabetes mellitus without complications: E11.9

## 2012-01-03 HISTORY — DX: Unspecified asthma, uncomplicated: J45.909

## 2012-01-03 HISTORY — DX: Disorder of thyroid, unspecified: E07.9

## 2012-01-03 MED ORDER — AMOXICILLIN 500 MG PO CAPS: 500.0000 mg | ORAL_CAPSULE | Freq: Three times a day (TID) | ORAL | Status: DC

## 2012-01-03 MED ORDER — HYDROCODONE-ACETAMINOPHEN 5-500 MG PO TABS: 1.0000 | ORAL_TABLET | Freq: Four times a day (QID) | ORAL | Status: DC | PRN

## 2012-01-03 NOTE — ED Provider Notes (Signed)
History     CSN: [id number]  Arrival date & time 01/03/12  1321   First MD Initiated Contact with Patient 01/03/12 1408      Chief Complaint  Patient presents with  . Dental Pain    (Consider location/radiation/quality/duration/timing/severity/associated sxs/prior treatment) HPI  Patient presents to the emergency department with a dental complaint. Symptoms began 3 weeks ago. The patient has tried to alleviate pain with ibuprofen.  Pain rated at a 10/10, characterized as throbbing in nature and located right upper and lower molars. Patient denies fever, night sweats, chills, difficulty swallowing or opening mouth, SOB, nuchal rigidity or decreased ROM of neck.  Patient does not have a dentist and requests a resource guide at discharge.   Past Medical History  Diagnosis Date  . Diabetes mellitus without complication   . Thyroid disease   . Asthma     Past Surgical History  Procedure Date  . Tubal ligation   . Cholecystectomy   . Cesarean section     Family History  Problem Relation Age of Onset  . Diabetes Mother   . Hypertension Mother   . Thyroid disease Mother     History  Substance Use Topics  . Smoking status: Never Smoker   . Smokeless tobacco: Not on file  . Alcohol Use: No    OB History    Grav Para Term Preterm Abortions TAB SAB Ect Mult Living                  Review of Systems  Review of Systems  Gen: no weight loss, fevers, chills, night sweats  Eyes: no discharge or drainage, no occular pain or visual changes  Nose: no epistaxis or rhinorrhea  Mouth: + dental pain, no sore throat  Neck: no neck pain  Lungs:No wheezing, coughing or hemoptysis CV: no chest pain, palpitations, dependent edema or orthopnea  Abd: no abdominal pain, nausea, vomiting  GU: no dysuria or gross hematuria  MSK:  No abnormalities  Neuro: no headache, no focal neurologic deficits  Skin: no abnormalities Psyche: negative.   Allergies  Bactrim  Home Medications     Current Outpatient Rx  Name  Route  Sig  Dispense  Refill  . IBUPROFEN 200 MG PO TABS   Oral   Take 1,000 mg by mouth every 6 (six) hours as needed. Pain         . AMOXICILLIN 500 MG PO CAPS   Oral   Take 1 capsule (500 mg total) by mouth 3 (three) times daily.   21 capsule   0   . HYDROCODONE-ACETAMINOPHEN 5-500 MG PO TABS   Oral   Take 1 tablet by mouth every 6 (six) hours as needed for pain.   12 tablet   0     BP 148/97  Pulse 69  Temp 98 F (36.7 C) (Oral)  Resp 18  SpO2 100%  LMP 12/29/2011  Physical Exam  Constitutional: She appears well-developed and well-nourished. No distress.  HENT:  Head: Normocephalic and atraumatic.  Mouth/Throat: Uvula is midline, oropharynx is clear and moist and mucous membranes are normal. Normal dentition. Dental caries (Pts tooth shows no obvious abscess but moderate to severe tenderness to palpation of marked tooth) present. No uvula swelling.    Eyes: Pupils are equal, round, and reactive to light.  Neck: Trachea normal, normal range of motion and full passive range of motion without pain. Neck supple.  Cardiovascular: Normal rate, regular rhythm, normal heart sounds and normal pulses.  Pulmonary/Chest: Effort normal and breath sounds normal. No respiratory distress. Chest wall is not dull to percussion. She exhibits no tenderness, no crepitus, no edema, no deformity and no retraction.  Abdominal: Normal appearance.  Musculoskeletal: Normal range of motion.  Neurological: She is alert. She has normal strength.  Skin: Skin is warm, dry and intact. She is not diaphoretic.  Psychiatric: She has a normal mood and affect. Her speech is normal. Cognition and memory are normal.    ED Course  Procedures (including critical care time)  Labs Reviewed - No data to display No results found.   1. Toothache       MDM  Pt given Rx for Vicodin 5-325 tabs and Amoxicillin. Patient informed that they need to find a dentist and have  the tooth pulled or the symptoms may be reoccurring. A Resource guide has been given with dental providers. Patient has been given return to ED precautions.         Dorthula Matas, PA 01/03/12 [phone]

## 2012-01-03 NOTE — Progress Notes (Signed)
Pt listed with no insurance coverage CM and [facility] liaison spoke with her Pt offered services to assist with finding a [place] self pay provider & health reform information 

## 2012-01-03 NOTE — ED Notes (Signed)
R/upper and lower dental pain x 3 weeks. Has not seen dentist

## 2012-01-06 NOTE — ED Provider Notes (Signed)
Medical screening examination/treatment/procedure(s) were performed by non-physician practitioner and as supervising physician I was immediately available for consultation/collaboration.  Flint Melter, MD 01/06/12 1258

## 2012-02-05 ENCOUNTER — Encounter (HOSPITAL_COMMUNITY): Payer: Self-pay | Admitting: Emergency Medicine

## 2012-02-05 ENCOUNTER — Emergency Department (HOSPITAL_COMMUNITY): Payer: Self-pay

## 2012-02-05 ENCOUNTER — Emergency Department (HOSPITAL_COMMUNITY)
Admission: EM | Admit: 2012-02-05 | Discharge: 2012-02-06 | Disposition: A | Discharge status: Home / Self Care | Payer: Self-pay | Attending: Emergency Medicine | Admitting: Emergency Medicine

## 2012-02-05 DIAGNOSIS — B349 Viral infection, unspecified: Secondary | ICD-10-CM

## 2012-02-05 DIAGNOSIS — R51 Headache: Secondary | ICD-10-CM | POA: Insufficient documentation

## 2012-02-05 DIAGNOSIS — R05 Cough: Secondary | ICD-10-CM | POA: Insufficient documentation

## 2012-02-05 DIAGNOSIS — R059 Cough, unspecified: Secondary | ICD-10-CM | POA: Insufficient documentation

## 2012-02-05 DIAGNOSIS — R5381 Other malaise: Secondary | ICD-10-CM | POA: Insufficient documentation

## 2012-02-05 DIAGNOSIS — R5383 Other fatigue: Secondary | ICD-10-CM | POA: Insufficient documentation

## 2012-02-05 DIAGNOSIS — M549 Dorsalgia, unspecified: Secondary | ICD-10-CM | POA: Insufficient documentation

## 2012-02-05 DIAGNOSIS — Z862 Personal history of diseases of the blood and blood-forming organs and certain disorders involving the immune mechanism: Secondary | ICD-10-CM | POA: Insufficient documentation

## 2012-02-05 DIAGNOSIS — J45909 Unspecified asthma, uncomplicated: Secondary | ICD-10-CM | POA: Insufficient documentation

## 2012-02-05 DIAGNOSIS — J3489 Other specified disorders of nose and nasal sinuses: Secondary | ICD-10-CM | POA: Insufficient documentation

## 2012-02-05 DIAGNOSIS — Z8639 Personal history of other endocrine, nutritional and metabolic disease: Secondary | ICD-10-CM | POA: Insufficient documentation

## 2012-02-05 DIAGNOSIS — B9789 Other viral agents as the cause of diseases classified elsewhere: Secondary | ICD-10-CM | POA: Insufficient documentation

## 2012-02-05 DIAGNOSIS — R509 Fever, unspecified: Secondary | ICD-10-CM | POA: Insufficient documentation

## 2012-02-05 DIAGNOSIS — E119 Type 2 diabetes mellitus without complications: Secondary | ICD-10-CM | POA: Insufficient documentation

## 2012-02-05 MED ORDER — IPRATROPIUM BROMIDE 0.02 % IN SOLN
0.5000 mg | RESPIRATORY_TRACT | Status: DC
  Administered 2012-02-05 (×2): 0.5 mg via RESPIRATORY_TRACT
  Filled 2012-02-05 (×2): qty 2.5

## 2012-02-05 MED ORDER — ALBUTEROL SULFATE (5 MG/ML) 0.5% IN NEBU
2.5000 mg | INHALATION_SOLUTION | RESPIRATORY_TRACT | Status: DC
  Administered 2012-02-05 (×2): 2.5 mg via RESPIRATORY_TRACT
  Filled 2012-02-05 (×2): qty 0.5

## 2012-02-05 MED ORDER — IBUPROFEN 200 MG PO TABS
600.0000 mg | ORAL_TABLET | Freq: Once | ORAL | Status: AC
  Administered 2012-02-06: 600 mg via ORAL
  Filled 2012-02-05: qty 3

## 2012-02-05 MED ORDER — SODIUM CHLORIDE 0.9 % IV BOLUS (SEPSIS)
1000.0000 mL | Freq: Once | INTRAVENOUS | Status: AC
  Administered 2012-02-06: 1000 mL via INTRAVENOUS

## 2012-02-05 NOTE — ED Provider Notes (Signed)
MSE was initiated and I personally evaluated the patient and placed orders for duo-nebs at  9:30 PM on February 05, 2012.  42 year old female with a history of asthma presents to the emergency department complaining of cough and wheezing since Friday, worsening yesterday when she ran out of her albuterol. On exam patient appears fatigued. She is wearing a face mask. She is tachycardic with normal heart sounds. Breath sounds with mild expiratory wheezes posterior middle lower lung fields. She is tachypneic. O2 sat 100% on room air. Duo-nebs ordered. The patient appears stable so that the remainder of the MSE may be completed by another provider.  Trevor Mace, PA-C 02/05/12 2132

## 2012-02-05 NOTE — ED Notes (Signed)
Patient reports that since Friday she has had a cough and cold. The patient has course voice, labored breathing and is very sleepy. The patient keeps falling asleep while in triage. The patient reports that she has SOB and a history of asthma. Patient is also tachycardic and tachypnic

## 2012-02-06 LAB — CBC WITH DIFFERENTIAL/PLATELET
Basophils Relative: 0 % (ref 0–1)
Eosinophils Absolute: 0 10*3/uL (ref 0.0–0.7)
Eosinophils Relative: 0 % (ref 0–5)
Hemoglobin: 10.6 g/dL — ABNORMAL LOW (ref 12.0–15.0)
Lymphs Abs: 2.2 10*3/uL (ref 0.7–4.0)
MCH: 30.8 pg (ref 26.0–34.0)
MCHC: 35.7 g/dL (ref 30.0–36.0)
MCV: 86.3 fL (ref 78.0–100.0)
Monocytes Absolute: 0.5 10*3/uL (ref 0.1–1.0)
RBC: 3.44 MIL/uL — ABNORMAL LOW (ref 3.87–5.11)

## 2012-02-06 LAB — BASIC METABOLIC PANEL
BUN: 8 mg/dL (ref 6–23)
CO2: 24 mEq/L (ref 19–32)
Calcium: 8.6 mg/dL (ref 8.4–10.5)
Creatinine, Ser: 0.5 mg/dL (ref 0.50–1.10)
GFR calc non Af Amer: >90 mL/min (ref >90)
Glucose, Bld: 127 mg/dL — ABNORMAL HIGH (ref 70–99)

## 2012-02-06 MED ORDER — ALBUTEROL SULFATE HFA 108 (90 BASE) MCG/ACT IN AERS: 1.0000 | INHALATION_SPRAY | Freq: Four times a day (QID) | RESPIRATORY_TRACT | Status: DC | PRN

## 2012-02-06 MED ORDER — LORATADINE 10 MG PO TABS: 10.0000 mg | ORAL_TABLET | Freq: Every day | ORAL | Status: DC

## 2012-02-06 MED ORDER — POTASSIUM CHLORIDE CRYS ER 20 MEQ PO TBCR
40.0000 meq | EXTENDED_RELEASE_TABLET | Freq: Once | ORAL | Status: AC
  Administered 2012-02-06: 40 meq via ORAL
  Filled 2012-02-06: qty 2

## 2012-02-06 MED ORDER — IBUPROFEN 400 MG PO TABS: 400.0000 mg | ORAL_TABLET | Freq: Four times a day (QID) | ORAL | Status: DC | PRN

## 2012-02-06 NOTE — ED Provider Notes (Addendum)
History     CSN: [id number]  Arrival date & time 02/05/12  2058   First MD Initiated Contact with Patient 02/05/12 2129      Chief Complaint  Patient presents with  . Shortness of Breath  . Fever  . Cough    (Consider location/radiation/quality/duration/timing/severity/associated sxs/prior treatment) HPI Comments: 42 y/o patient comes in with cc of sob, cough, fevers. Pt has hx of asthma, no other medical conditions. States that she has been sick since Friday, and having cough - mostly dry, sob and fevers. ROS is also positive for back aches, headaches and malaise. Pt has taken no breathing tx, and doesn't think she has wheezed. No chest pains. No hx of PE, DVT, and no risk factors for the same.   Patient is a 42 y.o. female presenting with shortness of breath, fever, and cough. The history is provided by the patient.  Shortness of Breath  Associated symptoms include a fever, cough and shortness of breath. Pertinent negatives include no chest pain and no wheezing.  Fever Primary symptoms of the febrile illness include fever, cough and shortness of breath. Primary symptoms do not include wheezing, abdominal pain, nausea, vomiting or diarrhea.  Cough Associated symptoms include shortness of breath. Pertinent negatives include no chest pain and no wheezing.    Past Medical History  Diagnosis Date  . Diabetes mellitus without complication   . Thyroid disease   . Asthma     Past Surgical History  Procedure Date  . Tubal ligation   . Cholecystectomy   . Cesarean section     Family History  Problem Relation Age of Onset  . Diabetes Mother   . Hypertension Mother   . Thyroid disease Mother     History  Substance Use Topics  . Smoking status: Never Smoker   . Smokeless tobacco: Not on file  . Alcohol Use: No    OB History    Grav Para Term Preterm Abortions TAB SAB Ect Mult Living                  Review of Systems  Constitutional: Positive for fever.  Negative for activity change.  HENT: Positive for congestion. Negative for facial swelling and neck pain.   Respiratory: Positive for cough and shortness of breath. Negative for wheezing.   Cardiovascular: Negative for chest pain.  Gastrointestinal: Negative for nausea, vomiting, abdominal pain, diarrhea, constipation, blood in stool and abdominal distention.  Genitourinary: Negative for hematuria and difficulty urinating.  Skin: Negative for color change.  Neurological: Negative for speech difficulty.  Hematological: Does not bruise/bleed easily.  Psychiatric/Behavioral: Negative for confusion.    Allergies  Bactrim  Home Medications   Current Outpatient Rx  Name  Route  Sig  Dispense  Refill  . GUAIFENESIN ER 600 MG PO TB12   Oral   Take 1,200 mg by mouth 2 (two) times daily.           BP 150/83  Pulse 128  Temp 101.2 F (38.4 C) (Oral)  Resp 22  Ht 5\' 2"  (1.575 m)  SpO2 99%  LMP 01/27/2012  Physical Exam  Nursing note and vitals reviewed. Constitutional: She is oriented to person, place, and time. She appears well-developed and well-nourished.  HENT:  Head: Normocephalic and atraumatic.  Eyes: EOM are normal. Pupils are equal, round, and reactive to light.  Neck: Neck supple.  Cardiovascular: Regular rhythm and normal heart sounds.   No murmur heard. Pulmonary/Chest: Effort normal. No respiratory distress. She  has no wheezes.  Abdominal: Soft. She exhibits no distension. There is no tenderness. There is no rebound and no guarding.  Neurological: She is alert and oriented to person, place, and time.  Skin: Skin is warm and dry.    ED Course  Procedures (including critical care time)   Labs Reviewed  CBC WITH DIFFERENTIAL  BASIC METABOLIC PANEL   No results found.   No diagnosis found.    MDM  DDX includes: Viral syndrome Influenza Pharyngitis Sinusitis  Pt comes in with URI like sx, with some dib, cough ,fevers and myalgias. Clinically  appears to be a viral syndrome.  Pt is tachycardic, so we will hydrate and get basic labs, Xray done in triage - results pending.  Derwood Kaplan, MD 02/06/12 0016  2:37 AM Neg Xrays. Will discharge now, likely a viral syndrome.  Derwood Kaplan, MD 02/06/12 [phone]

## 2012-03-01 ENCOUNTER — Emergency Department (HOSPITAL_COMMUNITY)
Admission: EM | Admit: 2012-03-01 | Discharge: 2012-03-02 | Disposition: A | Discharge status: Home / Self Care | Payer: BC Managed Care – PPO | Attending: Emergency Medicine | Admitting: Emergency Medicine

## 2012-03-01 DIAGNOSIS — K029 Dental caries, unspecified: Secondary | ICD-10-CM | POA: Insufficient documentation

## 2012-03-01 DIAGNOSIS — J45909 Unspecified asthma, uncomplicated: Secondary | ICD-10-CM | POA: Insufficient documentation

## 2012-03-01 DIAGNOSIS — E119 Type 2 diabetes mellitus without complications: Secondary | ICD-10-CM | POA: Insufficient documentation

## 2012-03-01 DIAGNOSIS — E079 Disorder of thyroid, unspecified: Secondary | ICD-10-CM | POA: Insufficient documentation

## 2012-03-01 DIAGNOSIS — Z79899 Other long term (current) drug therapy: Secondary | ICD-10-CM | POA: Insufficient documentation

## 2012-03-01 DIAGNOSIS — K0889 Other specified disorders of teeth and supporting structures: Secondary | ICD-10-CM

## 2012-03-02 ENCOUNTER — Encounter (HOSPITAL_COMMUNITY): Payer: Self-pay | Admitting: Emergency Medicine

## 2012-03-02 MED ORDER — OXYCODONE-ACETAMINOPHEN 5-325 MG PO TABS: 1.0000 | ORAL_TABLET | Freq: Four times a day (QID) | ORAL | Status: DC | PRN

## 2012-03-02 NOTE — ED Notes (Signed)
Pt verbalized understanding of d/c instructions. Pt ambulatory to d/c window

## 2012-03-02 NOTE — ED Provider Notes (Signed)
Medical screening examination/treatment/procedure(s) were performed by non-physician practitioner and as supervising physician I was immediately available for consultation/collaboration.   Laray Anger, DO 03/02/12 1806

## 2012-03-02 NOTE — ED Notes (Signed)
Pt c/o R sided dental pain x 2-3 months.

## 2012-03-02 NOTE — ED Provider Notes (Signed)
History     CSN: [id number]  Arrival date & time 03/01/12  2356   First MD Initiated Contact with Patient 03/02/12 0000      Chief Complaint  Patient presents with  . Dental Pain    (Consider location/radiation/quality/duration/timing/severity/associated sxs/prior treatment) HPI Comments: 42 year old female presents to the emergency department complaining of worsening right-sided dental pain that has been present for 2-3 months. She cannot recall anything in specific makes her pain worsened today. She describes the pain as sharp and throbbing and "really hurts". Pain radiates to her right ear. She's tried taking Motrin with relief for a couple hours, however the pain returns. She states she's been taking more than the recommended dose of motrin daily. Denies fever, chills, difficulty swallowing, nausea or vomiting. She does not have a dentist at this time due to insurance reasons.  The history is provided by the patient.    Past Medical History  Diagnosis Date  . Diabetes mellitus without complication   . Thyroid disease   . Asthma     Past Surgical History  Procedure Date  . Tubal ligation   . Cholecystectomy   . Cesarean section     Family History  Problem Relation Age of Onset  . Diabetes Mother   . Hypertension Mother   . Thyroid disease Mother     History  Substance Use Topics  . Smoking status: Never Smoker   . Smokeless tobacco: Not on file  . Alcohol Use: No    OB History    Grav Para Term Preterm Abortions TAB SAB Ect Mult Living                  Review of Systems  Constitutional: Negative for fever and chills.  HENT: Positive for dental problem. Negative for trouble swallowing, neck pain and neck stiffness.   Gastrointestinal: Negative for nausea and vomiting.  All other systems reviewed and are negative.    Allergies  Bactrim  Home Medications   Current Outpatient Rx  Name  Route  Sig  Dispense  Refill  . ALBUTEROL SULFATE HFA 108 (90  BASE) MCG/ACT IN AERS   Inhalation   Inhale 1-2 puffs into the lungs every 6 (six) hours as needed for wheezing.   1 Inhaler   0   . GUAIFENESIN ER 600 MG PO TB12   Oral   Take 1,200 mg by mouth 2 (two) times daily.         . IBUPROFEN 400 MG PO TABS   Oral   Take 1 tablet (400 mg total) by mouth every 6 (six) hours as needed for pain.   30 tablet   0   . LORATADINE 10 MG PO TABS   Oral   Take 1 tablet (10 mg total) by mouth daily.   10 tablet   0   . OXYCODONE-ACETAMINOPHEN 5-325 MG PO TABS   Oral   Take 1-2 tablets by mouth every 6 (six) hours as needed for pain.   10 tablet   0     BP 138/80  Pulse 70  Temp 98.2 F (36.8 C) (Oral)  Resp 20  Wt 203 lb 3.2 oz (92.171 kg)  SpO2 100%  LMP 01/27/2012  Physical Exam  Nursing note and vitals reviewed. Constitutional: She is oriented to person, place, and time. She appears well-developed and well-nourished. No distress.  HENT:  Head: Normocephalic and atraumatic.  Right Ear: Hearing, tympanic membrane, external ear and ear canal normal.  Left Ear:  Hearing, tympanic membrane, external ear and ear canal normal.  Mouth/Throat: Uvula is midline, oropharynx is clear and moist and mucous membranes are normal. Abnormal dentition. Dental caries present. No dental abscesses.    Eyes: Conjunctivae normal and EOM are normal.  Neck: Normal range of motion. Neck supple.  Cardiovascular: Normal rate, regular rhythm and normal heart sounds.   Pulmonary/Chest: Effort normal and breath sounds normal.  Musculoskeletal: Normal range of motion. She exhibits no edema.  Lymphadenopathy:       Head (right side): No submental and no submandibular adenopathy present.       Head (left side): No submental and no submandibular adenopathy present.    She has no cervical adenopathy.  Neurological: She is alert and oriented to person, place, and time.  Skin: Skin is warm and dry.  Psychiatric: She has a normal mood and affect. Her behavior  is normal.    ED Course  Procedures (including critical care time)  Labs Reviewed - No data to display No results found.   1. Pain, dental       MDM  42 year old female with dental pain without evidence of surrounding infection or abscess. I discussed the importance of followup with the dentist. Explained that she needs to call within the next 48 hours to be seen. I will give her 10 Percocet at this time. Patient states her understanding of plan and is agreeable.        Trevor Mace, PA-C 03/02/12 [phone]

## 2012-04-25 ENCOUNTER — Emergency Department (HOSPITAL_COMMUNITY)
Admission: EM | Admit: 2012-04-25 | Discharge: 2012-04-25 | Disposition: A | Discharge status: Home / Self Care | Payer: BC Managed Care – PPO | Attending: Emergency Medicine | Admitting: Emergency Medicine

## 2012-04-25 ENCOUNTER — Encounter (HOSPITAL_COMMUNITY): Payer: Self-pay | Admitting: Cardiology

## 2012-04-25 ENCOUNTER — Emergency Department (HOSPITAL_COMMUNITY): Payer: BC Managed Care – PPO

## 2012-04-25 DIAGNOSIS — Z79899 Other long term (current) drug therapy: Secondary | ICD-10-CM | POA: Insufficient documentation

## 2012-04-25 DIAGNOSIS — K047 Periapical abscess without sinus: Secondary | ICD-10-CM

## 2012-04-25 DIAGNOSIS — K029 Dental caries, unspecified: Secondary | ICD-10-CM | POA: Insufficient documentation

## 2012-04-25 DIAGNOSIS — R0602 Shortness of breath: Secondary | ICD-10-CM | POA: Insufficient documentation

## 2012-04-25 DIAGNOSIS — J45909 Unspecified asthma, uncomplicated: Secondary | ICD-10-CM | POA: Insufficient documentation

## 2012-04-25 DIAGNOSIS — R079 Chest pain, unspecified: Secondary | ICD-10-CM | POA: Insufficient documentation

## 2012-04-25 DIAGNOSIS — E119 Type 2 diabetes mellitus without complications: Secondary | ICD-10-CM | POA: Insufficient documentation

## 2012-04-25 DIAGNOSIS — K044 Acute apical periodontitis of pulpal origin: Secondary | ICD-10-CM | POA: Insufficient documentation

## 2012-04-25 DIAGNOSIS — E039 Hypothyroidism, unspecified: Secondary | ICD-10-CM | POA: Insufficient documentation

## 2012-04-25 LAB — BASIC METABOLIC PANEL
CO2: 24 mEq/L (ref 19–32)
Calcium: 8.9 mg/dL (ref 8.4–10.5)
Creatinine, Ser: 0.51 mg/dL (ref 0.50–1.10)
GFR calc Af Amer: >90 mL/min (ref >90)
GFR calc non Af Amer: >90 mL/min (ref >90)
Sodium: 136 mEq/L (ref 135–145)

## 2012-04-25 LAB — CBC
MCH: 30.4 pg (ref 26.0–34.0)
MCHC: 33.8 g/dL (ref 30.0–36.0)
MCV: 89.9 fL (ref 78.0–100.0)
Platelets: 311 10*3/uL (ref 150–400)
RDW: 12.9 % (ref 11.5–15.5)

## 2012-04-25 LAB — POCT I-STAT TROPONIN I: Troponin i, poc: 0.01 ng/mL (ref 0.00–0.08)

## 2012-04-25 MED ORDER — PENICILLIN V POTASSIUM 500 MG PO TABS: 1000.0000 mg | ORAL_TABLET | Freq: Two times a day (BID) | ORAL | Status: DC

## 2012-04-25 NOTE — ED Provider Notes (Signed)
History     CSN: [id number]  Arrival date & time 04/25/12  1722   First MD Initiated Contact with Patient 04/25/12 2039      Chief Complaint  Patient presents with  . Chest Pain  . Shortness of Breath   HPI  42 y/o female with history of diabetes and hypothyroidism who presents with cc of chest pain. The patient states she has had constant chest pain for the past two days. She states the pain is right sided. The pain is "dull". She states deep breaths makes the pain worse. The pain doesn't radiate. She has had increased shortness of breath. She denies nausea or vomiting. She is also complaining of dental pain which has been chronic. She states she has been unable to get an appointment with a dentist.   Past Medical History  Diagnosis Date  . Diabetes mellitus without complication   . Thyroid disease   . Asthma     Past Surgical History  Procedure Laterality Date  . Tubal ligation    . Cholecystectomy    . Cesarean section      Family History  Problem Relation Age of Onset  . Diabetes Mother   . Hypertension Mother   . Thyroid disease Mother     History  Substance Use Topics  . Smoking status: Never Smoker   . Smokeless tobacco: Not on file  . Alcohol Use: No    OB History   Grav Para Term Preterm Abortions TAB SAB Ect Mult Living                 Review of Systems  Constitutional: Negative for fever and chills.  Respiratory: Positive for shortness of breath. Negative for cough.   Cardiovascular: Positive for chest pain.  Gastrointestinal: Negative for nausea, vomiting and abdominal pain.  All other systems reviewed and are negative.   Allergies  Bactrim  Home Medications   Current Outpatient Rx  Name  Route  Sig  Dispense  Refill  . albuterol (PROVENTIL HFA;VENTOLIN HFA) 108 (90 BASE) MCG/ACT inhaler   Inhalation   Inhale 1-2 puffs into the lungs every 6 (six) hours as needed for wheezing.   1 Inhaler   0   . ibuprofen (ADVIL,MOTRIN) 400 MG  tablet   Oral   Take 1 tablet (400 mg total) by mouth every 6 (six) hours as needed for pain.   30 tablet   0   . loratadine (CLARITIN) 10 MG tablet   Oral   Take 1 tablet (10 mg total) by mouth daily.   10 tablet   0   . penicillin v potassium (VEETID) 500 MG tablet   Oral   Take 2 tablets (1,000 mg total) by mouth 2 (two) times daily. X 7 days   28 tablet   0     BP 111/71  Pulse 75  Temp(Src) 98.1 F (36.7 C) (Oral)  Resp 14  SpO2 96%  LMP 04/03/2012  Physical Exam  Nursing note and vitals reviewed. Constitutional: She is oriented to person, place, and time. She appears well-developed and well-nourished. No distress.  HENT:  Head: Normocephalic and atraumatic. No trismus in the jaw.  Mouth/Throat: Uvula is midline. Dental caries present. No oropharyngeal exudate.  Gingival swelling and redness next to chronic dental carries in teeth 28-30.   Eyes: Conjunctivae are normal. Pupils are equal, round, and reactive to light.  Neck: Normal range of motion. Neck supple.  Cardiovascular: Normal rate and regular rhythm.  Exam  reveals no gallop and no friction rub.   No murmur heard. Pulmonary/Chest: Effort normal and breath sounds normal.  Abdominal: Soft. She exhibits no distension. There is no tenderness.  Musculoskeletal: Normal range of motion. She exhibits no edema and no tenderness.  Neurological: She is alert and oriented to person, place, and time. She has normal strength. No cranial nerve deficit or sensory deficit. GCS eye subscore is 4. GCS verbal subscore is 5. GCS motor subscore is 6.  Skin: Skin is warm and dry.  Psychiatric: She has a normal mood and affect.    ED Course  Procedures (including critical care time)  Labs Reviewed  CBC - Abnormal; Notable for the following:    RBC 3.78 (*)    Hemoglobin 11.5 (*)    HCT 34.0 (*)    All other components within normal limits  BASIC METABOLIC PANEL  POCT I-STAT TROPONIN I   Dg Chest 2 View  04/25/2012   *RADIOLOGY REPORT*  Clinical Data: Right side chest pain, shortness of breath, history asthma  CHEST - 2 VIEW  Comparison: 02/06/2012  Findings: Upper-normal size of cardiac silhouette. Mediastinal contours and pulmonary vascularity normal. Chronic peribronchial thickening and slight accentuation perihilar markings, stable. No definite infiltrate, pleural effusion or pneumothorax. Improved aeration at medial right lung base versus previous exam. Surgical clips right upper quadrant question cholecystectomy. No acute osseous findings.  IMPRESSION: Chronic peribronchial thickening which could reflect bronchitis or reactive airway disease. No acute infiltrate.   Original Report Authenticated By: Ulyses Southward, M.D.    1. Chest pain   2. Dental infection     Date: 04/25/2012  Rate: 85  Rhythm: normal sinus rhythm  QRS Axis: normal  Intervals: normal  ST/T Wave abnormalities: normal  Conduction Disutrbances:none  Narrative Interpretation:   Old EKG Reviewed: unchanged  MDM  42 y/o female with history of diabetes and hypothyroidism who presents with cc of chest pain. EKG without acute ischemic changes. CXR unremarkable. Doubt pneumonia, dissection, or pneumothorax. Chest pain is atypical and has been present and continuous for the past several days. Doubt ACS given atypical symptoms and negative troponin. Doubt PE given low risk and PERC negative. Likely musculoskeletal pain. Will discharge with instructions to follow up with a primary care physician. The patient was given resources for primary care physician as well as area dental clinic's. She was discharged home with a script for PCN VK for peridental infection as well without evidence of abscess, lugwig's or PTA.        Shanon Ace, MD 04/25/12 [phone]

## 2012-04-25 NOTE — ED Notes (Signed)
Pt reports right sided chest pain that started yesterday and SOB when she takes a deep breath. Increased SOB with activity. Denies any n/v with the pain. States increased fatigue.

## 2012-04-26 NOTE — ED Provider Notes (Signed)
I performed a history and physical examination of  [name] and discussed her management with Dr. Ethelene Browns. I agree with the history, physical, assessment, and plan of care, with the following exceptions: None I was present for the following procedures: None  Time Spent in Critical Care of the patient: None  Time spent in discussions with the patient and family: 0-15 minutes  YOUNG WOMAN with atypical chest pain. Chest pain for more than 24 hours, with negative labs, reassuring ekg. We have advised patient to see her PCP for this atypical pain.  [name], [name]  Derwood Kaplan, MD 04/26/12 1905

## 2012-07-09 ENCOUNTER — Emergency Department (HOSPITAL_COMMUNITY)
Admission: EM | Admit: 2012-07-09 | Discharge: 2012-07-09 | Disposition: A | Discharge status: Home / Self Care | Payer: BC Managed Care – PPO | Attending: Emergency Medicine | Admitting: Emergency Medicine

## 2012-07-09 ENCOUNTER — Encounter (HOSPITAL_COMMUNITY): Payer: Self-pay | Admitting: *Deleted

## 2012-07-09 DIAGNOSIS — R51 Headache: Secondary | ICD-10-CM | POA: Insufficient documentation

## 2012-07-09 DIAGNOSIS — E079 Disorder of thyroid, unspecified: Secondary | ICD-10-CM | POA: Insufficient documentation

## 2012-07-09 DIAGNOSIS — E119 Type 2 diabetes mellitus without complications: Secondary | ICD-10-CM | POA: Insufficient documentation

## 2012-07-09 DIAGNOSIS — J45909 Unspecified asthma, uncomplicated: Secondary | ICD-10-CM | POA: Insufficient documentation

## 2012-07-09 MED ORDER — HYDROCODONE-ACETAMINOPHEN 5-325 MG PO TABS: 1.0000 | ORAL_TABLET | Freq: Four times a day (QID) | ORAL | Status: DC | PRN

## 2012-07-09 MED ORDER — MELOXICAM 15 MG PO TABS: 15.0000 mg | ORAL_TABLET | Freq: Every day | ORAL | Status: DC

## 2012-07-09 NOTE — ED Provider Notes (Signed)
Medical screening examination/treatment/procedure(s) were performed by non-physician practitioner and as supervising physician I was immediately available for consultation/collaboration.   David H Yao, MD 07/09/12 2321 

## 2012-07-09 NOTE — ED Notes (Signed)
Pt reports R upper and lower toothache x 3 months.  Pt reports possible broken teeth.  No dentist d/t financial issues.

## 2012-07-09 NOTE — ED Provider Notes (Signed)
History  This chart was scribed for non-physician practitioner Arthor Captain, PA-C working with Richardean Canal, MD, by Candelaria Stagers, ED Scribe. This patient was seen in room WTR7/WTR7 and the patient's care was started at 5:59 PM   CSN: [id number]  Arrival date & time 07/09/12  1618   First MD Initiated Contact with Patient 07/09/12 1626      Chief Complaint  Patient presents with  . Dental Pain     The history is provided by the patient. No language interpreter was used.   HPI Comments: [name] is a 42 y.o. female who presents to the Emergency Department complaining of dull throbbing right upper dental pain that started about six months and has gradually worsened.  Pt is also experiencing right sided facial pain which she states varies in location.  She denies fever, chills, nausea, or vomiting.  She has taken ibuprofen with relief.  Pt reports she has taken an antibiotic for dental infection previously with no relief.    Past Medical History  Diagnosis Date  . Diabetes mellitus without complication   . Thyroid disease   . Asthma     Past Surgical History  Procedure Laterality Date  . Tubal ligation    . Cholecystectomy    . Cesarean section      Family History  Problem Relation Age of Onset  . Diabetes Mother   . Hypertension Mother   . Thyroid disease Mother     History  Substance Use Topics  . Smoking status: Never Smoker   . Smokeless tobacco: Not on file  . Alcohol Use: No    OB History   Grav Para Term Preterm Abortions TAB SAB Ect Mult Living                  Review of Systems  HENT: Positive for dental problem (right upper and lower dental pain).        Right sided facial pain  All other systems reviewed and are negative.    Allergies  Bactrim  Home Medications   Current Outpatient Rx  Name  Route  Sig  Dispense  Refill  . ibuprofen (ADVIL,MOTRIN) 200 MG tablet   Oral   Take 1,000 mg by mouth every 6 (six) hours as needed for pain.        Marland Kitchen albuterol (PROVENTIL HFA;VENTOLIN HFA) 108 (90 BASE) MCG/ACT inhaler   Inhalation   Inhale 1-2 puffs into the lungs every 6 (six) hours as needed for wheezing.   1 Inhaler   0     BP 131/79  Pulse 75  Temp(Src) 97.2 F (36.2 C) (Oral)  Resp 18  SpO2 100%  LMP 07/01/2012  Physical Exam  Nursing note and vitals reviewed. Constitutional: She is oriented to person, place, and time. She appears well-developed and well-nourished. No distress.  HENT:  Head: Normocephalic and atraumatic.  Right Ear: Tympanic membrane normal.  Left Ear: Tympanic membrane normal. Decreased hearing is noted.  Missing dentition and multiple cavities.  No signs of infection.  No temporal bruits.    Eyes: EOM are normal.  Neck: Neck supple. No tracheal deviation present.  Cardiovascular: Normal rate.   Pulmonary/Chest: Effort normal. No respiratory distress.  Musculoskeletal: Normal range of motion.  Neurological: She is alert and oriented to person, place, and time.  Skin: Skin is warm and dry.  Psychiatric: She has a normal mood and affect. Her behavior is normal.    ED Course  Procedures   DIAGNOSTIC  STUDIES: Oxygen Saturation is 100% on room air, normal by my interpretation.    COORDINATION OF CARE:  6:03 PM Discussed course of care with pt which includes antibiotic for dental infection.  Advised pt to follow up with PCP if no improvement .  Pt understands and agrees.   Labs Reviewed - No data to display No results found.   1. Facial pain       MDM  No signs of dental infections. No temporal bruit or signs of vesicular eruption. 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 Will d.c with pain meds and NSAIDS. Resource guide given   I personally performed the services described in this documentation, which was scribed in my presence. The recorded information has been reviewed and is accurate.          Arthor Captain, PA-C 07/09/12 1840

## 2012-08-03 ENCOUNTER — Emergency Department (HOSPITAL_COMMUNITY)
Admission: EM | Admit: 2012-08-03 | Discharge: 2012-08-03 | Disposition: A | Discharge status: Home / Self Care | Payer: BC Managed Care – PPO | Attending: Emergency Medicine | Admitting: Emergency Medicine

## 2012-08-03 ENCOUNTER — Encounter (HOSPITAL_COMMUNITY): Payer: Self-pay | Admitting: Emergency Medicine

## 2012-08-03 DIAGNOSIS — M549 Dorsalgia, unspecified: Secondary | ICD-10-CM

## 2012-08-03 DIAGNOSIS — R51 Headache: Secondary | ICD-10-CM | POA: Insufficient documentation

## 2012-08-03 DIAGNOSIS — E079 Disorder of thyroid, unspecified: Secondary | ICD-10-CM | POA: Insufficient documentation

## 2012-08-03 DIAGNOSIS — J45909 Unspecified asthma, uncomplicated: Secondary | ICD-10-CM | POA: Insufficient documentation

## 2012-08-03 DIAGNOSIS — E119 Type 2 diabetes mellitus without complications: Secondary | ICD-10-CM | POA: Insufficient documentation

## 2012-08-03 DIAGNOSIS — Z881 Allergy status to other antibiotic agents status: Secondary | ICD-10-CM | POA: Insufficient documentation

## 2012-08-03 DIAGNOSIS — Z79899 Other long term (current) drug therapy: Secondary | ICD-10-CM | POA: Insufficient documentation

## 2012-08-03 DIAGNOSIS — M546 Pain in thoracic spine: Secondary | ICD-10-CM | POA: Insufficient documentation

## 2012-08-03 LAB — BASIC METABOLIC PANEL
CO2: 25 mEq/L (ref 19–32)
Calcium: 8.5 mg/dL (ref 8.4–10.5)
Creatinine, Ser: 0.52 mg/dL (ref 0.50–1.10)
GFR calc non Af Amer: >90 mL/min (ref >90)
Sodium: 140 mEq/L (ref 135–145)

## 2012-08-03 LAB — CBC WITH DIFFERENTIAL/PLATELET
Basophils Absolute: 0 10*3/uL (ref 0.0–0.1)
Basophils Relative: 0 % (ref 0–1)
Eosinophils Absolute: 0.2 10*3/uL (ref 0.0–0.7)
Eosinophils Relative: 4 % (ref 0–5)
HCT: 33.2 % — ABNORMAL LOW (ref 36.0–46.0)
Lymphocytes Relative: 37 % (ref 12–46)
MCH: 31.2 pg (ref 26.0–34.0)
MCHC: 34.6 g/dL (ref 30.0–36.0)
MCV: 90 fL (ref 78.0–100.0)
Monocytes Absolute: 0.4 10*3/uL (ref 0.1–1.0)
Platelets: 293 10*3/uL (ref 150–400)
RDW: 12.7 % (ref 11.5–15.5)

## 2012-08-03 LAB — SEDIMENTATION RATE: Sed Rate: 19 mm/hr (ref 0–22)

## 2012-08-03 MED ORDER — SODIUM CHLORIDE 0.9 % IV BOLUS (SEPSIS)
1000.0000 mL | Freq: Once | INTRAVENOUS | Status: AC
  Administered 2012-08-03: 1000 mL via INTRAVENOUS

## 2012-08-03 MED ORDER — OXYCODONE-ACETAMINOPHEN 5-325 MG PO TABS: 1.0000 | ORAL_TABLET | Freq: Three times a day (TID) | ORAL | Status: DC | PRN

## 2012-08-03 MED ORDER — OXYCODONE-ACETAMINOPHEN 5-325 MG PO TABS
1.0000 | ORAL_TABLET | Freq: Once | ORAL | Status: AC
  Administered 2012-08-03: 1 via ORAL
  Filled 2012-08-03: qty 1

## 2012-08-03 MED ORDER — IBUPROFEN 800 MG PO TABS
800.0000 mg | ORAL_TABLET | Freq: Once | ORAL | Status: AC
  Administered 2012-08-03: 800 mg via ORAL
  Filled 2012-08-03: qty 1

## 2012-08-03 NOTE — ED Provider Notes (Signed)
History    CSN: [id number] Arrival date & time 08/03/12  0740  First MD Initiated Contact with Patient 08/03/12 [phone]     Chief Complaint  Patient presents with  . Shoulder Pain    Left shoulder  . Facial Pain   (Consider location/radiation/quality/duration/timing/severity/associated sxs/prior Treatment) Patient is a 42 y.o. female presenting with shoulder pain. The history is provided by the patient.  Shoulder Pain Associated symptoms comments: Nethra Mehlberg is a 42 y.o. female h/o R facial pain for 7 months, here with complaints of the same.  Patient states her right temporal area hurts with radiation through her V1-V3 distribution. Described as an ache, intermittent, relieved with motrin at home. She denies jaw claudication, vision changes, or vascular disorders.  She was previously treated for dental infection without any relief.  Patient also complains of R paraspinal mid back pain.  This occurred this am after getting out of her car.  She denies any trauma or injury to the area or previous history of back pain.  It is worse when she moves and better when laying still. She denies CP, SOB, or h/o of blood clots.  ROS is otherwise negative..   Past Medical History  Diagnosis Date  . Diabetes mellitus without complication   . Thyroid disease   . Asthma    Past Surgical History  Procedure Laterality Date  . Tubal ligation    . Cholecystectomy    . Cesarean section     Family History  Problem Relation Age of Onset  . Diabetes Mother   . Hypertension Mother   . Thyroid disease Mother    History  Substance Use Topics  . Smoking status: Never Smoker   . Smokeless tobacco: Not on file  . Alcohol Use: No   OB History   Grav Para Term Preterm Abortions TAB SAB Ect Mult Living                 Review of Systems 10 Systems reviewed and are negative for acute change except as noted in the HPI.   Allergies  Bactrim  Home Medications   Current Outpatient Rx  Name  Route  Sig   Dispense  Refill  . albuterol (PROVENTIL HFA;VENTOLIN HFA) 108 (90 BASE) MCG/ACT inhaler   Inhalation   Inhale 1-2 puffs into the lungs every 6 (six) hours as needed for wheezing.   1 Inhaler   0   . ibuprofen (ADVIL,MOTRIN) 200 MG tablet   Oral   Take 1,200 mg by mouth every 6 (six) hours as needed for pain.          BP 126/76  Pulse 73  Temp(Src) 98.2 F (36.8 C) (Oral)  Resp 18  Ht 5\' 2"  (1.575 m)  SpO2 99%  LMP 07/28/2012 Physical Exam  Nursing note and vitals reviewed. Constitutional: She is oriented to person, place, and time. She appears well-developed and well-nourished. No distress.  HENT:  Head: Normocephalic and atraumatic.  Nose: Nose normal.  Mouth/Throat: Oropharynx is clear and moist. No oropharyngeal exudate.  B TM appear to have pus/fluid collection behind the ear, no erythema noted, TMs are dull.  Eyes: Conjunctivae and EOM are normal. Pupils are equal, round, and reactive to light. No scleral icterus.  Neck: Normal range of motion. Neck supple. No JVD present. No tracheal deviation present. No thyromegaly present.  Cardiovascular: Normal rate, regular rhythm and normal heart sounds.  Exam reveals no gallop and no friction rub.   No murmur heard. Pulmonary/Chest:  Effort normal and breath sounds normal. No respiratory distress. She has no wheezes. She exhibits no tenderness.  Abdominal: Soft. Bowel sounds are normal. She exhibits no distension and no mass. There is no tenderness. There is no rebound and no guarding.  Musculoskeletal: Normal range of motion. She exhibits no edema and no tenderness.  Lymphadenopathy:    She has no cervical adenopathy.  Neurological: She is alert and oriented to person, place, and time. No cranial nerve deficit.  Skin: Skin is warm and dry. No rash noted. She is not diaphoretic. No erythema. No pallor.    ED Course  Procedures (including critical care time) Labs Reviewed  CBC WITH DIFFERENTIAL  BASIC METABOLIC PANEL   SEDIMENTATION RATE  D-DIMER, QUANTITATIVE  TSH  T3, FREE   No results found. No diagnosis found.  Date: 08/03/2012  Rate: 80  Rhythm: normal sinus rhythm  QRS Axis: normal  Intervals: normal  ST/T Wave abnormalities: normal  Conduction Disutrbances:none  Narrative Interpretation:   Old EKG Reviewed: unchanged    MDM  Patient will be evaluated for temporal arteritis with an ESR level.  She is of low risk as she is less than 50, denies jaw claudication/vision changes. However the patient has a history and physical exam consistent with diagnosis.  She will be treated for ear infections upon discharge as well.  Her back pain is likely musculoskeletal as it is positional.  However PE can not be excluded.  We will get a d-dimer for evaluation.    ESR and D-dimer are negative.  Patient reassured with these findings.  Motrin did not help relieve her pain, thus she was given a percocet and will be Dc home with the same.  Patient encourage to find a PCP to follow up with and to manage her chronic conditions.  She was given resources to do so now that she has insurance.  Patient is amendable with this plan.  Tomasita Crumble, MD 08/03/12 1126

## 2012-08-03 NOTE — ED Provider Notes (Signed)
I saw and evaluated the patient, reviewed the resident's note and I agree with the findings and plan.   .Face to face Exam:  General:  Awake HEENT:  Atraumatic Resp:  Normal effort Abd:  Nondistended Neuro:No focal weakness    Nelia Shi, MD 08/03/12 1134

## 2012-08-03 NOTE — ED Notes (Signed)
Pt reports left shoulder pain onset today. Pt has decrease range of motion due to pain. Pt also c/o right side facial pain x 7 months.

## 2012-09-10 ENCOUNTER — Emergency Department (HOSPITAL_COMMUNITY)
Admission: EM | Admit: 2012-09-10 | Discharge: 2012-09-10 | Disposition: A | Discharge status: Home / Self Care | Payer: BC Managed Care – PPO | Attending: Emergency Medicine | Admitting: Emergency Medicine

## 2012-09-10 ENCOUNTER — Encounter (HOSPITAL_COMMUNITY): Payer: Self-pay | Admitting: *Deleted

## 2012-09-10 DIAGNOSIS — E119 Type 2 diabetes mellitus without complications: Secondary | ICD-10-CM | POA: Insufficient documentation

## 2012-09-10 DIAGNOSIS — J45909 Unspecified asthma, uncomplicated: Secondary | ICD-10-CM | POA: Insufficient documentation

## 2012-09-10 DIAGNOSIS — M2550 Pain in unspecified joint: Secondary | ICD-10-CM

## 2012-09-10 DIAGNOSIS — R350 Frequency of micturition: Secondary | ICD-10-CM | POA: Insufficient documentation

## 2012-09-10 DIAGNOSIS — R300 Dysuria: Secondary | ICD-10-CM | POA: Insufficient documentation

## 2012-09-10 DIAGNOSIS — Z791 Long term (current) use of non-steroidal anti-inflammatories (NSAID): Secondary | ICD-10-CM | POA: Insufficient documentation

## 2012-09-10 DIAGNOSIS — N390 Urinary tract infection, site not specified: Secondary | ICD-10-CM

## 2012-09-10 DIAGNOSIS — M2559 Pain in other specified joint: Secondary | ICD-10-CM | POA: Insufficient documentation

## 2012-09-10 DIAGNOSIS — Z8639 Personal history of other endocrine, nutritional and metabolic disease: Secondary | ICD-10-CM | POA: Insufficient documentation

## 2012-09-10 DIAGNOSIS — Z79899 Other long term (current) drug therapy: Secondary | ICD-10-CM | POA: Insufficient documentation

## 2012-09-10 DIAGNOSIS — Z862 Personal history of diseases of the blood and blood-forming organs and certain disorders involving the immune mechanism: Secondary | ICD-10-CM | POA: Insufficient documentation

## 2012-09-10 LAB — URINALYSIS, ROUTINE W REFLEX MICROSCOPIC
Glucose, UA: NEGATIVE mg/dL
Ketones, ur: NEGATIVE mg/dL
pH: 6 (ref 5.0–8.0)

## 2012-09-10 LAB — URINE MICROSCOPIC-ADD ON

## 2012-09-10 MED ORDER — NAPROXEN 250 MG PO TABS
500.0000 mg | ORAL_TABLET | Freq: Once | ORAL | Status: AC
  Administered 2012-09-10: 500 mg via ORAL
  Filled 2012-09-10: qty 2

## 2012-09-10 MED ORDER — CIPROFLOXACIN HCL 500 MG PO TABS: 500.0000 mg | ORAL_TABLET | Freq: Two times a day (BID) | ORAL | Status: DC

## 2012-09-10 MED ORDER — NAPROXEN 500 MG PO TABS: 500.0000 mg | ORAL_TABLET | Freq: Two times a day (BID) | ORAL | Status: DC

## 2012-09-10 NOTE — ED Notes (Signed)
Pt states she began experiencing joint pain 5 days ago.  Whenever she moves a body part "it hurts at the joint".  Also c/o of headaches and she thinks she has a bladder infection even though she does not have difficulty urinating. Pain decreases when she lays down.  Denies fevers or any new physical activity.

## 2012-09-10 NOTE — ED Provider Notes (Signed)
Medical screening examination/treatment/procedure(s) were performed by non-physician practitioner and as supervising physician I was immediately available for consultation/collaboration.  Darlys Gales, MD 09/10/12 [phone]

## 2012-09-10 NOTE — ED Provider Notes (Signed)
CSN: [id number]     Arrival date & time 09/10/12  1328 History     First MD Initiated Contact with Patient 09/10/12 1441     Chief Complaint  Patient presents with  . Generalized Body Aches   (Consider location/radiation/quality/duration/timing/severity/associated sxs/prior Treatment) HPI Comments: Patient is a 42 year old female past medical history significant for DM, asthma, thyroid disease presenting to the emergency department for mild-to-moderate throbbing joint discomfort that began 5 days ago without radiation. Patient endorses that every joint hurts. Patient states she has not tried to take any over-the-counter medications to help with discomfort. She denies any recent camping trips or outdoor activities with possible tick exposure.  Patient denies any trauma or injury. She states her pain is worse with ambulating and joint range of motion. She is able to ambulate and move her joints despite pain. Patient is also complaining about dysuria, frequency, urgency and states this feels like the beginning of a urinary tract infection. Patient denies any fevers, chills, nausea, vomiting, abdominal pain, chest pain, shortness of breath. Patient has not been seen by her primary care physician in over 2 years.   Past Medical History  Diagnosis Date  . Diabetes mellitus without complication   . Asthma   . Thyroid disease    Past Surgical History  Procedure Laterality Date  . Tubal ligation    . Cholecystectomy    . Cesarean section     Family History  Problem Relation Age of Onset  . Diabetes Mother   . Hypertension Mother   . Thyroid disease Mother    History  Substance Use Topics  . Smoking status: Never Smoker   . Smokeless tobacco: Not on file  . Alcohol Use: No   OB History   Grav Para Term Preterm Abortions TAB SAB Ect Mult Living                 Review of Systems  Constitutional: Negative for fever and chills.  HENT: Negative.   Eyes: Negative.   Respiratory:  Negative for shortness of breath.   Cardiovascular: Negative for chest pain and leg swelling.  Gastrointestinal: Negative for nausea, vomiting and abdominal pain.  Genitourinary: Positive for dysuria, urgency and frequency. Negative for flank pain.  Musculoskeletal: Positive for arthralgias.  Skin: Negative for rash.  Neurological: Negative.     Allergies  Bactrim  Home Medications   Current Outpatient Rx  Name  Route  Sig  Dispense  Refill  . Ibuprofen (ADVIL PO)   Oral   Take 4 tablets by mouth daily as needed (pain).         Marland Kitchen albuterol (PROVENTIL HFA;VENTOLIN HFA) 108 (90 BASE) MCG/ACT inhaler   Inhalation   Inhale 1-2 puffs into the lungs every 6 (six) hours as needed for wheezing.   1 Inhaler   0   . ciprofloxacin (CIPRO) 500 MG tablet   Oral   Take 1 tablet (500 mg total) by mouth 2 (two) times daily.   6 tablet   0   . naproxen (NAPROSYN) 500 MG tablet   Oral   Take 1 tablet (500 mg total) by mouth 2 (two) times daily with a meal.   30 tablet   0    BP 152/100  Pulse 81  Temp(Src) 98.3 F (36.8 C) (Oral)  Resp 16  SpO2 100%  LMP 09/03/2012 Physical Exam  Constitutional: She is oriented to person, place, and time. She appears well-developed and well-nourished. No distress.  HENT:  Head: Normocephalic  and atraumatic.  Right Ear: External ear normal.  Left Ear: External ear normal.  Nose: Nose normal.  Mouth/Throat: Oropharynx is clear and moist.  Eyes: Conjunctivae are normal.  Neck: Normal range of motion. Neck supple.  Cardiovascular: Normal rate, regular rhythm and normal heart sounds.   Pulmonary/Chest: Effort normal and breath sounds normal.  Abdominal: Soft. There is no tenderness.  Musculoskeletal: Normal range of motion. She exhibits no edema.  Neurological: She is alert and oriented to person, place, and time.  Skin: Skin is warm, dry and intact. No bruising, no ecchymosis, no petechiae and no rash noted. She is not diaphoretic. No  erythema.  Psychiatric: She has a normal mood and affect.    ED Course   Procedures (including critical care time)  Labs Reviewed  URINALYSIS, ROUTINE W REFLEX MICROSCOPIC - Abnormal; Notable for the following:    APPearance CLOUDY (*)    Hgb urine dipstick TRACE (*)    Nitrite POSITIVE (*)    Leukocytes, UA MODERATE (*)    All other components within normal limits  URINE MICROSCOPIC-ADD ON - Abnormal; Notable for the following:    Squamous Epithelial / LPF FEW (*)    Bacteria, UA MANY (*)    All other components within normal limits  URINE CULTURE   No results found. 1. UTI (urinary tract infection)   2. Joint pain     MDM  Pt has been diagnosed with a UTI. Pt is afebrile, no CVA tenderness, normotensive, and denies N/V. Pt to be dc home with antibiotics and instructions to follow up with PCP if symptoms persist.  No acute emergent cause suspected for widespread joint pain. No trauma. No deformity noted. No erythematous, warm joints appreciated on examination. Range of motion intact in all joints. No suspected tick exposure. No concern for Lyme disease or [facility] spotted fever, septic joint. Advised patient that she needs to obtain a primary care doctor for further workup of her joint discomfort as it may require more extensive laboratory testing done is indicated at this time in the emergency department. Patient is agreeable to plan. Patient is stable at time of discharge    Jeannetta Ellis, PA-C 09/10/12 1553

## 2012-09-12 LAB — URINE CULTURE

## 2012-09-13 ENCOUNTER — Telehealth (HOSPITAL_COMMUNITY): Payer: Self-pay | Admitting: Emergency Medicine

## 2012-09-13 NOTE — Progress Notes (Signed)
ED Antimicrobial Stewardship Positive Culture Follow Up   [name] is an 42 y.o. female who presented to [facility] on 09/10/2012 with a chief complaint of  Chief Complaint  Patient presents with  . Generalized Body Aches    Recent Results (from the past 720 hour(s))  URINE CULTURE     Status: None   Collection Time    09/10/12  1:59 PM      Result Value Range Status   Specimen Description URINE, CLEAN CATCH   Final   Special Requests NONE   Final   Culture  Setup Time     Final   Value: 09/10/2012 21:58     Performed at [facility] Count     Final   Value: >=100,000 COLONIES/ML     Performed at [facility]   Culture     Final   Value: ESCHERICHIA COLI     Performed at [facility]   Report Status 09/12/2012 FINAL   Final   Organism ID, Bacteria ESCHERICHIA COLI   Final    []  Treated with Cipro, organism resistant to prescribed antimicrobial []  Patient discharged originally without antimicrobial agent and treatment is now indicated  New antibiotic prescription: Keflex 500 mg PO twice daily for 7 days  ED Provider: Rhea Bleacher, PA-C  [name] 09/13/2012, 11:54 AM Infectious Diseases Pharmacist Phone# [phone]

## 2012-09-13 NOTE — ED Notes (Signed)
Post ED Visit - Positive Culture Follow-up: Successful Patient Follow-Up  Culture assessed and recommendations reviewed by: []  Wes Dulaney, Pharm.D., BCPS []  Celedonio Miyamoto, [address].D., BCPS []  Georgina Pillion, Pharm.D., BCPS []  [place], [place].D., BCPS, AAHIVP []  Estella Husk, Pharm.D., BCPS, AAHIVP [x]  Abran Duke, [address].D.  Positive urine culture  []  Patient discharged without antimicrobial prescription and treatment is now indicated [x]  Organism is resistant to prescribed ED discharge antimicrobial []  Patient with positive blood cultures  Changes discussed with ED provider: Rhea Bleacher PA-C New antibiotic prescription: Keflex 500 mg twice daily for 7 days    Kylie A Holland 09/13/2012, 12:55 PM

## 2012-12-28 ENCOUNTER — Emergency Department (HOSPITAL_COMMUNITY)
Admission: EM | Admit: 2012-12-28 | Discharge: 2012-12-28 | Disposition: A | Discharge status: Home / Self Care | Payer: BC Managed Care – PPO | Attending: Emergency Medicine | Admitting: Emergency Medicine

## 2012-12-28 ENCOUNTER — Encounter (HOSPITAL_COMMUNITY): Payer: Self-pay | Admitting: Emergency Medicine

## 2012-12-28 DIAGNOSIS — J45909 Unspecified asthma, uncomplicated: Secondary | ICD-10-CM | POA: Insufficient documentation

## 2012-12-28 DIAGNOSIS — G501 Atypical facial pain: Secondary | ICD-10-CM | POA: Insufficient documentation

## 2012-12-28 DIAGNOSIS — K0889 Other specified disorders of teeth and supporting structures: Secondary | ICD-10-CM

## 2012-12-28 DIAGNOSIS — E119 Type 2 diabetes mellitus without complications: Secondary | ICD-10-CM | POA: Insufficient documentation

## 2012-12-28 DIAGNOSIS — E079 Disorder of thyroid, unspecified: Secondary | ICD-10-CM | POA: Insufficient documentation

## 2012-12-28 DIAGNOSIS — Z79899 Other long term (current) drug therapy: Secondary | ICD-10-CM | POA: Insufficient documentation

## 2012-12-28 DIAGNOSIS — K089 Disorder of teeth and supporting structures, unspecified: Secondary | ICD-10-CM | POA: Insufficient documentation

## 2012-12-28 MED ORDER — HYDROCODONE-ACETAMINOPHEN 5-325 MG PO TABS: 1.0000 | ORAL_TABLET | Freq: Four times a day (QID) | ORAL | Status: DC | PRN

## 2012-12-28 MED ORDER — PENICILLIN V POTASSIUM 500 MG PO TABS: 500.0000 mg | ORAL_TABLET | Freq: Four times a day (QID) | ORAL | Status: AC

## 2012-12-28 NOTE — ED Notes (Signed)
Pt states that she has had R sided facial pain that she thinks is either her ear or her teeth. States she takes 6 ibuprofen 5-6 times a day. Estimates it has been going on x 1 year.

## 2012-12-28 NOTE — ED Provider Notes (Signed)
CSN: [id number]     Arrival date & time 12/28/12  1834 History   None     This chart was scribed for non-physician practitioner, Santiago Glad PA-C, working with Candyce Churn, MD by Arlan Organ, ED Scribe. This patient was seen in room WTR6/WTR6 and the patient's care was started at 7:41 PM.   No chief complaint on file.  The history is provided by the patient. No language interpreter was used.     HPI Comments: [name] is a 42 y.o. female who presents to the Emergency Department complaining of gradual onset, unchanged, constant right sided facial pain that started over a year ago. She states the pain radiates into her neck and into her head. She describes the pain as sharp or dull at times, and states it lasts for long periods of time.  She states nothing makes the pain worse. She has tried ibuprofen with mild temporary relief. She denies fever, chills, sinus pain, or congestion.  She reports that she has been seen for this in the ED in the past and has been diagnosed with a dental infection.  She currently does not have a dentist.  Past Medical History  Diagnosis Date  . Diabetes mellitus without complication   . Asthma   . Thyroid disease    Past Surgical History  Procedure Laterality Date  . Tubal ligation    . Cholecystectomy    . Cesarean section     Family History  Problem Relation Age of Onset  . Diabetes Mother   . Hypertension Mother   . Thyroid disease Mother    History  Substance Use Topics  . Smoking status: Never Smoker   . Smokeless tobacco: Not on file  . Alcohol Use: No   OB History   Grav Para Term Preterm Abortions TAB SAB Ect Mult Living                 Review of Systems  HENT:       Generalized facial pain  All other systems reviewed and are negative.    Allergies  Bactrim  Home Medications   Current Outpatient Rx  Name  Route  Sig  Dispense  Refill  . albuterol (PROVENTIL HFA;VENTOLIN HFA) 108 (90 BASE) MCG/ACT inhaler  Inhalation   Inhale 1-2 puffs into the lungs every 6 (six) hours as needed for wheezing.   1 Inhaler   0   . Ibuprofen (ADVIL PO)   Oral   Take 4 tablets by mouth daily as needed (pain).         . naproxen (NAPROSYN) 500 MG tablet   Oral   Take 1 tablet (500 mg total) by mouth 2 (two) times daily with a meal.   30 tablet   0    Triage Vitals: BP 149/87  Pulse 84  Temp(Src) 98.5 F (36.9 C) (Oral)  Resp 14  SpO2 99%  LMP 12/05/2012  Physical Exam  Nursing note and vitals reviewed. Constitutional: She is oriented to person, place, and time. She appears well-developed and well-nourished. No distress.  HENT:  Head: Normocephalic and atraumatic.  Right Ear: Hearing, tympanic membrane and external ear normal.  Left Ear: Hearing, tympanic membrane and external ear normal.  Nose: Right sinus exhibits no maxillary sinus tenderness and no frontal sinus tenderness. Left sinus exhibits no maxillary sinus tenderness and no frontal sinus tenderness.  Mouth/Throat: Uvula is midline, oropharynx is clear and moist and mucous membranes are normal. No trismus in the jaw.  Abnormal dentition. No dental abscesses or uvula swelling. No oropharyngeal exudate, posterior oropharyngeal edema, posterior oropharyngeal erythema or tonsillar abscesses.  Poor dental hygiene. Pt able to open and close mouth with out difficulty. Airway intact. Uvula midline. Mild gingival swelling with tenderness over right upper and lower gingiva, but no fluctuance. No swelling or tenderness of submental and submandibular regions.  No tongue elevation.  No sublingual tenderness.  Eyes: Conjunctivae and EOM are normal. Pupils are equal, round, and reactive to light.  Neck: Normal range of motion and full passive range of motion without pain. Neck supple.  Cardiovascular: Normal rate, regular rhythm and normal heart sounds.   Pulmonary/Chest: Effort normal and breath sounds normal. No stridor. No respiratory distress. She has no  wheezes.  Musculoskeletal: Normal range of motion.  Lymphadenopathy:       Head (right side): No submental, no submandibular, no tonsillar, no preauricular and no posterior auricular adenopathy present.       Head (left side): No submental, no submandibular, no tonsillar, no preauricular and no posterior auricular adenopathy present.    She has no cervical adenopathy.  Neurological: She is alert and oriented to person, place, and time.  Skin: Skin is warm and dry. No rash noted. She is not diaphoretic.  Psychiatric: She has a normal mood and affect. Her behavior is normal.    ED Course  Procedures (including critical care time)  DIAGNOSTIC STUDIES: Oxygen Saturation is 99% on RA, Normal by my interpretation.    COORDINATION OF CARE: 7:43 PM- Will give pain medication. Discussed treatment plan with pt at bedside and pt agreed to plan.     Labs Review Labs Reviewed - No data to display Imaging Review No results found.  EKG Interpretation   None       MDM  No diagnosis found. Patient with toothache.  No gross abscess.  Exam unconcerning for Ludwig's angina or spread of infection.  Will treat with penicillin and pain medicine.  Urged patient to follow-up with dentist.    I personally performed the services described in this documentation, which was scribed in my presence. The recorded information has been reviewed and is accurate.    Santiago Glad, PA-C 12/30/12 1203

## 2012-12-30 NOTE — ED Provider Notes (Signed)
Medical screening examination/treatment/procedure(s) were performed by non-physician practitioner and as supervising physician I was immediately available for consultation/collaboration.  EKG Interpretation   None         Candyce Churn, MD 12/30/12 1206

## 2013-02-01 ENCOUNTER — Encounter (HOSPITAL_COMMUNITY): Payer: Self-pay | Admitting: Emergency Medicine

## 2013-02-01 ENCOUNTER — Emergency Department (HOSPITAL_COMMUNITY)
Admission: EM | Admit: 2013-02-01 | Discharge: 2013-02-01 | Disposition: A | Discharge status: Other Acute Inpatient Hospital | Payer: BC Managed Care – PPO | Attending: Internal Medicine | Admitting: Internal Medicine

## 2013-02-01 DIAGNOSIS — J45909 Unspecified asthma, uncomplicated: Secondary | ICD-10-CM | POA: Insufficient documentation

## 2013-02-01 DIAGNOSIS — N390 Urinary tract infection, site not specified: Secondary | ICD-10-CM | POA: Insufficient documentation

## 2013-02-01 DIAGNOSIS — Z3202 Encounter for pregnancy test, result negative: Secondary | ICD-10-CM | POA: Insufficient documentation

## 2013-02-01 DIAGNOSIS — R51 Headache: Secondary | ICD-10-CM | POA: Insufficient documentation

## 2013-02-01 DIAGNOSIS — E119 Type 2 diabetes mellitus without complications: Secondary | ICD-10-CM | POA: Insufficient documentation

## 2013-02-01 LAB — URINALYSIS, ROUTINE W REFLEX MICROSCOPIC
BILIRUBIN URINE: NEGATIVE
GLUCOSE, UA: NEGATIVE mg/dL
KETONES UR: NEGATIVE mg/dL
Nitrite: NEGATIVE
PROTEIN: NEGATIVE mg/dL
Specific Gravity, Urine: 1.024 (ref 1.005–1.030)
UROBILINOGEN UA: 0.2 mg/dL (ref 0.0–1.0)
pH: 6.5 (ref 5.0–8.0)

## 2013-02-01 LAB — URINE MICROSCOPIC-ADD ON

## 2013-02-01 LAB — GLUCOSE, CAPILLARY: Glucose-Capillary: 121 mg/dL — ABNORMAL HIGH (ref 70–99)

## 2013-02-01 LAB — PREGNANCY, URINE: Preg Test, Ur: NEGATIVE

## 2013-02-01 NOTE — ED Notes (Signed)
Pt states she is having pain with urination and urinary frequency and also c/o headache on the right side of her head  Pt states she has had the headache for a long time but it is worse now

## 2013-02-04 LAB — URINE CULTURE

## 2013-02-05 ENCOUNTER — Telehealth (HOSPITAL_BASED_OUTPATIENT_CLINIC_OR_DEPARTMENT_OTHER): Payer: Self-pay | Admitting: *Deleted

## 2013-02-05 NOTE — ED Notes (Signed)
Mother notified of positive MRSA results and educated to MRSA precautions.

## 2013-02-05 NOTE — Progress Notes (Signed)
ED Antimicrobial Stewardship Positive Culture Follow Up   [name] is an 43 y.o. female who presented to [facility] on 02/01/2013 with a chief complaint of  Chief Complaint  Patient presents with  . Urinary Tract Infection  . Headache    Recent Results (from the past 720 hour(s))  URINE CULTURE     Status: None   Collection Time    02/01/13  7:55 PM      Result Value Range Status   Specimen Description URINE, CLEAN CATCH   Final   Special Requests NONE   Final   Culture  Setup Time     Final   Value: 02/02/2013 00:43     Performed at [facility]     Final   Value: >=100,000 COLONIES/ML     Performed at [facility]   Culture     Final   Value: ESCHERICHIA COLI     Performed at [facility]   Report Status 02/04/2013 FINAL   Final   Organism ID, Bacteria ESCHERICHIA COLI   Final     [x]  Patient discharged originally without antimicrobial agent and treatment is now indicated  New antibiotic prescription: ciprofloxacin 250mg  po BID x 3 days  ED Provider: Jeannett Senior PA-C   [name] 02/05/2013, 9:59 AM Infectious Diseases Pharmacist Phone# [phone]

## 2013-02-05 NOTE — ED Notes (Signed)
Post ED Visit - Positive Culture Follow-up: Successful Patient Follow-Up  Culture assessed and recommendations reviewed by: []  Wes Stonewall, Pharm.D., BCPS [x]  Heide Guile, Pharm.D., BCPS []  Alycia Rossetti, Pharm.D., BCPS []  [place], [place].D., BCPS, AAHIVP []  Legrand Como, Pharm.D., BCPS, AAHIVP  Positive urine culture  []  Patient discharged without antimicrobial prescription and treatment is now indicated [x]  Organism is resistant to prescribed ED discharge antimicrobial []  Patient with positive blood cultures  Changes discussed with ED provider:[name] New antibiotic prescription Cipro 250 mg po BID x 3 days Attempt made to contact patient -Line busy  [name] 02/05/2013, 5:56 PM

## 2013-02-19 ENCOUNTER — Emergency Department (HOSPITAL_COMMUNITY): Payer: BC Managed Care – PPO

## 2013-02-19 ENCOUNTER — Emergency Department (HOSPITAL_COMMUNITY)
Admission: EM | Admit: 2013-02-19 | Discharge: 2013-02-19 | Disposition: A | Discharge status: Home / Self Care | Payer: BC Managed Care – PPO | Attending: Emergency Medicine | Admitting: Emergency Medicine

## 2013-02-19 ENCOUNTER — Encounter (HOSPITAL_COMMUNITY): Payer: Self-pay | Admitting: Emergency Medicine

## 2013-02-19 DIAGNOSIS — E119 Type 2 diabetes mellitus without complications: Secondary | ICD-10-CM | POA: Insufficient documentation

## 2013-02-19 DIAGNOSIS — H53149 Visual discomfort, unspecified: Secondary | ICD-10-CM | POA: Insufficient documentation

## 2013-02-19 DIAGNOSIS — R519 Headache, unspecified: Secondary | ICD-10-CM

## 2013-02-19 DIAGNOSIS — G8929 Other chronic pain: Secondary | ICD-10-CM | POA: Insufficient documentation

## 2013-02-19 DIAGNOSIS — R51 Headache: Secondary | ICD-10-CM | POA: Insufficient documentation

## 2013-02-19 DIAGNOSIS — Z880 Allergy status to penicillin: Secondary | ICD-10-CM | POA: Insufficient documentation

## 2013-02-19 DIAGNOSIS — J45909 Unspecified asthma, uncomplicated: Secondary | ICD-10-CM | POA: Insufficient documentation

## 2013-02-19 DIAGNOSIS — H538 Other visual disturbances: Secondary | ICD-10-CM | POA: Insufficient documentation

## 2013-02-19 LAB — SEDIMENTATION RATE: SED RATE: 25 mm/h — AB (ref 0–22)

## 2013-02-19 LAB — POCT I-STAT CREATININE: CREATININE: 0.7 mg/dL (ref 0.50–1.10)

## 2013-02-19 MED ORDER — BUTALBITAL-APAP-CAFFEINE 50-325-40 MG PO TABS: 1.0000 | ORAL_TABLET | Freq: Four times a day (QID) | ORAL | Status: DC | PRN

## 2013-02-19 MED ORDER — BUTALBITAL-APAP-CAFFEINE 50-325-40 MG PO TABS
1.0000 | ORAL_TABLET | Freq: Once | ORAL | Status: AC
  Administered 2013-02-19: 1 via ORAL
  Filled 2013-02-19: qty 1

## 2013-02-19 NOTE — ED Provider Notes (Signed)
Medical screening examination/treatment/procedure(s) were performed by non-physician practitioner and as supervising physician I was immediately available for consultation/collaboration.  EKG Interpretation   None         Ezequiel Essex, MD 02/19/13 1600

## 2013-02-19 NOTE — ED Notes (Addendum)
Pt R parietal/temporal pain that radiates behind R ear.  Stats she has been here for the same many times and we have not been able to figure it out.  Pt came today b/c pain is too great.  Pt is photophobic but denies nausea.  Pt has been taking 1200 mg ibuprofen for the pain.

## 2013-02-19 NOTE — ED Notes (Signed)
Phlebotomy paged for labs.

## 2013-02-19 NOTE — ED Provider Notes (Signed)
CSN: [id number]     Arrival date & time 02/19/13  1206 History   First MD Initiated Contact with Patient 02/19/13 1322     Chief Complaint  Patient presents with  . Facial Pain   (Consider location/radiation/quality/duration/timing/severity/associated sxs/prior Treatment) HPI  43 year old female with non-insulin dependent diabetes, asthma, thyroid disease presents complaining of headache. Patient states she has been doing with chronic headache ongoing for more than a year. Describe headaches as a sharp and aching sensation to the right side of her head near her temple. Headache usually persistent but improved when she takes ibuprofen which she takes on a regular basis. Pt takes up 2400mg  of ibuprofen daily.  Headache sometimes radiates to her right ear, and to her neck. When headache is severe she has temporary blurred vision and light sensitivity. She did take ibuprofen shortly prior to arrival and now the headache is minimal. She decide to come to ER for evaluation due to the chronicity of a headache. She denies fever, chills, neck stiffness, or rash. She denies any numbness or weakness. She denies double vision, nausea vomiting diarrhea, chest pain or shortness of breath. Denies abd pain, or change in urine.    Past Medical History  Diagnosis Date  . Diabetes mellitus without complication   . Asthma   . Thyroid disease    Past Surgical History  Procedure Laterality Date  . Tubal ligation    . Cholecystectomy    . Cesarean section     Family History  Problem Relation Age of Onset  . Diabetes Mother   . Hypertension Mother   . Thyroid disease Mother    History  Substance Use Topics  . Smoking status: Never Smoker   . Smokeless tobacco: Not on file  . Alcohol Use: No   OB History   Grav Para Term Preterm Abortions TAB SAB Ect Mult Living                 Review of Systems  Constitutional: Negative for fever.  Skin: Negative for rash.  Neurological: Positive for headaches.  Negative for facial asymmetry, speech difficulty, weakness and numbness.    Allergies  Bactrim and Penicillins  Home Medications   Current Outpatient Rx  Name  Route  Sig  Dispense  Refill  . HYDROcodone-acetaminophen (NORCO/VICODIN) 5-325 MG per tablet   Oral   Take 1-2 tablets by mouth every 6 (six) hours as needed for moderate pain.         Marland Kitchen ibuprofen (ADVIL,MOTRIN) 200 MG tablet   Oral   Take 200 mg by mouth every 6 (six) hours as needed (pain).          BP 147/91  Pulse 100  Temp(Src) 97.8 F (36.6 C) (Oral)  Resp 18  Ht 5\' 2"  (1.575 m)  Wt 207 lb (93.895 kg)  BMI 37.85 kg/m2  SpO2 100%  LMP 12/05/2012 Physical Exam  Nursing note and vitals reviewed. Constitutional: She appears well-developed and well-nourished. No distress.  HENT:  Head: Atraumatic.  Right Ear: External ear normal.  Left Ear: External ear normal.  Mouth/Throat: Oropharynx is clear and moist. No oropharyngeal exudate.  No sinus tenderness, no facial rash.  No temporal bruits noted  Eyes: Conjunctivae and EOM are normal.  Neck: Neck supple.  No nuchal rigidity  Neurological: She is alert.  Speech clear, pupils equal round reactive to light, extraocular movements intact  Normal peripheral visual fields Cranial nerves III through XII normal including no facial droop Follows commands, moves  all extremities x4, normal strength to bilateral upper and lower extremities at all major muscle groups including grip Sensation normal to light touch  Coordination intact, no limb ataxia, finger-nose-finger normal Rapid alternating movements normal No pronator drift Gait normal   Skin: No rash noted.  Psychiatric: She has a normal mood and affect.    ED Course  Procedures (including critical care time)  1:45 PM Patient with recurrent headache, minimal headache at this time. Nose sudden onset thunderclap headache concerning for subarachnoid hemorrhage, no fever, nuchal rigidity concerning for  meningitis, no focal no deficits concerning for stroke. She has been seen in the ED multiple times for headache as she is certainly concerned about the chronicity of a headache. She does not drink any coffee on a regular basis concerning for caffeine withdrawal.  She does have some bad dentition but I do not think headache is related to her dental pain. No evidence of infection on exam. She is currently in no acute distress. Will obtain head CT scan as requested. Likely will refer patient to neurologist for further management. Her pain is minimal at this time.  Pt does takes large amount of ibuprofen daily, upward to 2400mg  of ibuprofen a day.  Denies any PUD sxs.  No abd pain.  Will obtain creatinine  3:51 PM Pt with temporal pain.  No temporal bruits, age is <50.  Low suspicion for temporal arteritis but will obtain sed rates.    Head CT without acute changes.  Normal Creatinine.  No evidence of acute renal failure 2/2 NSAIDs use.  Pt made aware not to exceed recommended dose of NSAIDs.  I discussed care with oncoming provider, who will d/c pt pending seds rates.  If positive, likely need steroid.    Will also provide resources, neurology follow up and fioricet for headache.     Labs Review Labs Reviewed  SEDIMENTATION RATE  POCT I-STAT CREATININE   Imaging Review Ct Head Wo Contrast  02/19/2013   CLINICAL DATA:  Right parietal temporal headache, photophobia  EXAM: CT HEAD WITHOUT CONTRAST  TECHNIQUE: Contiguous axial images were obtained from the base of the skull through the vertex without contrast.  COMPARISON:  None  FINDINGS: Normal appearance of the intracranial structures. No evidence for acute hemorrhage, mass lesion, midline shift, hydrocephalus or large infarct. No acute bony abnormality. The visualized sinuses are clear.  IMPRESSION: No acute intracranial abnormality.   Electronically Signed   By: Daryll Brod M.D.   On: 02/19/2013 14:34    EKG Interpretation   None        MDM   1. Headache, chronic daily    BP 139/82  Pulse 75  Temp(Src) 98.2 F (36.8 C) (Oral)  Resp 17  Ht 5\' 2"  (1.575 m)  Wt 207 lb (93.895 kg)  BMI 37.85 kg/m2  SpO2 100%  LMP 12/05/2012  I have reviewed nursing notes and vital signs. I personally reviewed the imaging tests through PACS system  I reviewed available ER/hospitalization records thought the EMR     Domenic Moras, [place] 02/19/13 1557

## 2013-02-19 NOTE — Discharge Instructions (Signed)
You have been evaluated for your headache.  Please follow up with a primary care provider by using resources below or with neurologist for further evaluation of your headache.  Please avoid using excessive amount of ibuprofen as it can harm your kidney.    Headaches, Frequently Asked Questions MIGRAINE HEADACHES Q: What is migraine? What causes it? How can I treat it? A: Generally, migraine headaches begin as a dull ache. Then they develop into a constant, throbbing, and pulsating pain. You may experience pain at the temples. You may experience pain at the front or back of one or both sides of the head. The pain is usually accompanied by a combination of:  Nausea.  Vomiting.  Sensitivity to light and noise. Some people (about 15%) experience an aura (see below) before an attack. The cause of migraine is believed to be chemical reactions in the brain. Treatment for migraine may include over-the-counter or prescription medications. It may also include self-help techniques. These include relaxation training and biofeedback.  Q: What is an aura? A: About 15% of people with migraine get an "aura". This is a sign of neurological symptoms that occur before a migraine headache. You may see wavy or jagged lines, dots, or flashing lights. You might experience tunnel vision or blind spots in one or both eyes. The aura can include visual or auditory hallucinations (something imagined). It may include disruptions in smell (such as strange odors), taste or touch. Other symptoms include:  Numbness.  A "pins and needles" sensation.  Difficulty in recalling or speaking the correct word. These neurological events may last as long as 60 minutes. These symptoms will fade as the headache begins. Q: What is a trigger? A: Certain physical or environmental factors can lead to or "trigger" a migraine. These include:  Foods.  Hormonal changes.  Weather.  Stress. It is important to remember that triggers are  different for everyone. To help prevent migraine attacks, you need to figure out which triggers affect you. Keep a headache diary. This is a good way to track triggers. The diary will help you talk to your healthcare professional about your condition. Q: Does weather affect migraines? A: Bright sunshine, hot, humid conditions, and drastic changes in barometric pressure may lead to, or "trigger," a migraine attack in some people. But studies have shown that weather does not act as a trigger for everyone with migraines. Q: What is the link between migraine and hormones? A: Hormones start and regulate many of your body's functions. Hormones keep your body in balance within a constantly changing environment. The levels of hormones in your body are unbalanced at times. Examples are during menstruation, pregnancy, or menopause. That can lead to a migraine attack. In fact, about three quarters of all women with migraine report that their attacks are related to the menstrual cycle.  Q: Is there an increased risk of stroke for migraine sufferers? A: The likelihood of a migraine attack causing a stroke is very remote. That is not to say that migraine sufferers cannot have a stroke associated with their migraines. In persons under age 78, the most common associated factor for stroke is migraine headache. But over the course of a person's normal life span, the occurrence of migraine headache may actually be associated with a reduced risk of dying from cerebrovascular disease due to stroke.  Q: What are acute medications for migraine? A: Acute medications are used to treat the pain of the headache after it has started. Examples over-the-counter medications, NSAIDs,  ergots, and triptans.  Q: What are the triptans? A: Triptans are the newest class of abortive medications. They are specifically targeted to treat migraine. Triptans are vasoconstrictors. They moderate some chemical reactions in the brain. The triptans work  on receptors in your brain. Triptans help to restore the balance of a neurotransmitter called serotonin. Fluctuations in levels of serotonin are thought to be a main cause of migraine.  Q: Are over-the-counter medications for migraine effective? A: Over-the-counter, or "OTC," medications may be effective in relieving mild to moderate pain and associated symptoms of migraine. But you should see your caregiver before beginning any treatment regimen for migraine.  Q: What are preventive medications for migraine? A: Preventive medications for migraine are sometimes referred to as "prophylactic" treatments. They are used to reduce the frequency, severity, and length of migraine attacks. Examples of preventive medications include antiepileptic medications, antidepressants, beta-blockers, calcium channel blockers, and NSAIDs (nonsteroidal anti-inflammatory drugs). Q: Why are anticonvulsants used to treat migraine? A: During the past few years, there has been an increased interest in antiepileptic drugs for the prevention of migraine. They are sometimes referred to as "anticonvulsants". Both epilepsy and migraine may be caused by similar reactions in the brain.  Q: Why are antidepressants used to treat migraine? A: Antidepressants are typically used to treat people with depression. They may reduce migraine frequency by regulating chemical levels, such as serotonin, in the brain.  Q: What alternative therapies are used to treat migraine? A: The term "alternative therapies" is often used to describe treatments considered outside the scope of conventional Western medicine. Examples of alternative therapy include acupuncture, acupressure, and yoga. Another common alternative treatment is herbal therapy. Some herbs are believed to relieve headache pain. Always discuss alternative therapies with your caregiver before proceeding. Some herbal products contain arsenic and other toxins. TENSION HEADACHES Q: What is a  tension-type headache? What causes it? How can I treat it? A: Tension-type headaches occur randomly. They are often the result of temporary stress, anxiety, fatigue, or anger. Symptoms include soreness in your temples, a tightening band-like sensation around your head (a "vice-like" ache). Symptoms can also include a pulling feeling, pressure sensations, and contracting head and neck muscles. The headache begins in your forehead, temples, or the back of your head and neck. Treatment for tension-type headache may include over-the-counter or prescription medications. Treatment may also include self-help techniques such as relaxation training and biofeedback. CLUSTER HEADACHES Q: What is a cluster headache? What causes it? How can I treat it? A: Cluster headache gets its name because the attacks come in groups. The pain arrives with little, if any, warning. It is usually on one side of the head. A tearing or bloodshot eye and a runny nose on the same side of the headache may also accompany the pain. Cluster headaches are believed to be caused by chemical reactions in the brain. They have been described as the most severe and intense of any headache type. Treatment for cluster headache includes prescription medication and oxygen. SINUS HEADACHES Q: What is a sinus headache? What causes it? How can I treat it? A: When a cavity in the bones of the face and skull (a sinus) becomes inflamed, the inflammation will cause localized pain. This condition is usually the result of an allergic reaction, a tumor, or an infection. If your headache is caused by a sinus blockage, such as an infection, you will probably have a fever. An x-ray will confirm a sinus blockage. Your caregiver's treatment might include  antibiotics for the infection, as well as antihistamines or decongestants.  REBOUND HEADACHES Q: What is a rebound headache? What causes it? How can I treat it? A: A pattern of taking acute headache medications too  often can lead to a condition known as "rebound headache." A pattern of taking too much headache medication includes taking it more than 2 days per week or in excessive amounts. That means more than the label or a caregiver advises. With rebound headaches, your medications not only stop relieving pain, they actually begin to cause headaches. Doctors treat rebound headache by tapering the medication that is being overused. Sometimes your caregiver will gradually substitute a different type of treatment or medication. Stopping may be a challenge. Regularly overusing a medication increases the potential for serious side effects. Consult a caregiver if you regularly use headache medications more than 2 days per week or more than the label advises. ADDITIONAL QUESTIONS AND ANSWERS Q: What is biofeedback? A: Biofeedback is a self-help treatment. Biofeedback uses special equipment to monitor your body's involuntary physical responses. Biofeedback monitors:  Breathing.  Pulse.  Heart rate.  Temperature.  Muscle tension.  Brain activity. Biofeedback helps you refine and perfect your relaxation exercises. You learn to control the physical responses that are related to stress. Once the technique has been mastered, you do not need the equipment any more. Q: Are headaches hereditary? A: Four out of five (80%) of people that suffer report a family history of migraine. Scientists are not sure if this is genetic or a family predisposition. Despite the uncertainty, a child has a 50% chance of having migraine if one parent suffers. The child has a 75% chance if both parents suffer.  Q: Can children get headaches? A: By the time they reach high school, most young people have experienced some type of headache. Many safe and effective approaches or medications can prevent a headache from occurring or stop it after it has begun.  Q: What type of doctor should I see to diagnose and treat my headache? A: Start with  your primary caregiver. Discuss his or her experience and approach to headaches. Discuss methods of classification, diagnosis, and treatment. Your caregiver may decide to recommend you to a headache specialist, depending upon your symptoms or other physical conditions. Having diabetes, allergies, etc., may require a more comprehensive and inclusive approach to your headache. The National Headache Foundation will provide, upon request, a list of [facility] physician members in your state. Document Released: 04/06/2003 Document Revised: 04/08/2011 Document Reviewed: 09/14/2007 [facility] Patient Information 2014 [employer], [place].   Emergency Department Resource Guide 1) Find a Doctor and Pay Out of Pocket Although you won't have to find out who is covered by your insurance plan, it is a good idea to ask around and get recommendations. You will then need to call the office and see if the doctor you have chosen will accept you as a new patient and what types of options they offer for patients who are self-pay. Some doctors offer discounts or will set up payment plans for their patients who do not have insurance, but you will need to ask so you aren't surprised when you get to your appointment.  2) Contact Your Local Health Department Not all health departments have doctors that can see patients for sick visits, but many do, so it is worth a call to see if yours does. If you don't know where your local health department is, you can check in your phone book. The CDC  also has a tool to help you locate your state's health department, and many state websites also have listings of all of their local health departments.  3) Find a [place] [facility] If your illness is not likely to be very severe or complicated, you may want to try a walk in clinic. These are popping up all over the country in pharmacies, drugstores, and shopping centers. They're usually staffed by nurse practitioners or physician assistants that have been  trained to treat common illnesses and complaints. They're usually fairly quick and inexpensive. However, if you have serious medical issues or chronic medical problems, these are probably not your best option.  No Primary Care Doctor: - Call Health Connect at  [phone] - they can help you locate a primary care doctor that  accepts your insurance, provides certain services, etc. - Physician Referral Service- [phone]  Chronic Pain Problems: Organization         Address  Phone   Notes  [facility]  [phone] Patients need to be referred by their primary care doctor.   Medication Assistance: Organization         Address  Phone   Notes  [facility] Medication [facility] [address] [place], [place] [address] [phone] --Must be a resident of [facility] -- Must have NO insurance coverage whatsoever (no Medicaid/ Medicare, etc.) -- The pt. MUST have a primary care doctor that directs their care regularly and follows them in the community   [facility]  [phone]   [employer]  [phone]    Agencies that provide inexpensive medical care: Organization         Address  Phone   Notes  [place]  [phone]   [facility]    [phone]   [facility] [place], [place] [phone]   [facility] [place] [address] [place] [phone]   Planned Parenthood    [phone]   [facility]    [phone]   [place] and [address], [place] Phone:  [phone], Fax:  [phone] Hours of Operation:  9 am - 6 pm, M-F.  Also accepts Medicaid/Medicare and self-pay.  [facility] for [place], [address], [place] Phone: [phone], Fax: [phone]. Hours of Operation:  8:30 am - 5:30 pm, M-F.  Also accepts Medicaid and self-pay.   [facility] [place] [address], [place] Phone: [phone]   [place], [place], [place] [phone], Ext. [phone] Mondays & Thursdays: 7-9 AM.  First 15 patients are seen on a first come, first serve basis.    [place] Providers:  Organization         Address  Phone   Notes  [facility] [address], [place] [phone] Also accepts self-pay patients.  [facility] [address], [place]  [phone]   [place], [address], [place] [phone]   [facility] [address] [place] [phone]   Lucianne Lei [address], [place]   [phone] Only accepts [place] Access [place] patients after they have their name applied to their card.   Self-Pay (no insurance)  in [facility]:  Organization         Address  Phone   Notes  Sickle Cell Patients, [facility] [place] [phone]   [facility] [place] [phone]   [facility] [place]  [place], [address],  [phone]   [facility]/Dr. Osei-Bonsu  [address], [place] or [address], [place] [phone] Phone number for both [place] and [place] locations is the same.  [facility] [address], [place] [phone]   [facility] [address], [place] or [address] [phone]   [facility] [address] [place] [phone], phone; [phone], fax Sees patients 1st and 3rd Saturday of every month.  Must not qualify for public or private insurance (i.e. Medicaid, Medicare, [place] Health Choice, Veterans' Benefits)  Household income should be no  more than 200% of the poverty level The clinic cannot treat you if you are pregnant or think you are pregnant  Sexually transmitted diseases are not treated at the clinic.    Dental Care: Organization         Address  Phone  Notes  [facility] [place] [phone] Accepts children up to age 43 who are enrolled in [place] or [place]; pregnant women with a Medicaid card; and children who have applied for Medicaid or [place] Health Choice, but were declined, whose parents can pay a reduced fee at time of service.  [facility]  [address], [place] [phone] Accepts children up to age 51 who are enrolled in [place] or [place]; pregnant women with a Medicaid card; and children who have applied for Medicaid or [place] Health Choice, but were declined, whose parents can pay a reduced fee at time of service.  [place] [facility] Access PRO[facility]  [place] [phone] Patients are seen by appointment only. Walk-ins are not accepted. [place] will see patients 42 years of age and older. Monday - Tuesday (8am-5pm) Most Wednesdays (8:30-5pm) $30 per visit, cash only  [facility] Adult Dental Access PROGRAM  [address], [facility] [phone] Patients are seen by appointment only. Walk-ins are not accepted. [place] will see patients 64 years of age and older. One Wednesday Evening (Monthly: Volunteer Based).  $30 per visit, cash only  [place]  [phone] for adults; Children under age 36, call Graduate Pediatric Dentistry at [phone]. Children aged 49-14, please call [phone] to request a pediatric application.  Dental services are provided in all areas of dental care including fillings, crowns and bridges, complete and partial dentures, implants, gum treatment, root canals,  and extractions. Preventive care is also provided. Treatment is provided to both adults and children. Patients are selected via a lottery and there is often a waiting list.   [facility] [address] [place]  [phone] [email or web address]   [facility] [address] [place], [place] [phone], Ext. [phone] Second and Fourth Thursday of each month, opens at 6:30 AM; Clinic ends at 9 AM.  Patients are seen on a first-come first-served basis, and a limited number are seen during  each clinic.   [facility]  [address] [place], [place] [phone]   Eligibility Requirements You must have lived in [place], [place], or [place] counties for at least the last three months.   You cannot be eligible for state or federal sponsored [place], including [employer], [place], or [employer].   You generally cannot be eligible for healthcare insurance through your employer.    How to apply: Eligibility screenings are held every Tuesday and Wednesday afternoon from 1:00 pm until 4:00 pm. You do not need an appointment for the interview!  [facility] [address] [place], [place] [phone]   [facility] Health Department  [phone]   [facility] Health Department  [phone]   [facility] Health Department  [phone]    Behavioral Health Resources in the Community: Intensive Outpatient Programs Organization         Address  Phone  Notes  [facility] [address], [place], [place] [phone]   [facility] [address], [place], [place] [phone]   [facility] [address], [place], [place]  [phone]   [facility] [address],  [place], [place] [phone] or [phone]   Substance Abuse Resources Organization         Address  Phone  Notes  [facility]  3230345674    [facility]  [phone]   [facility] [place]  [phone]   Floydene Flock  [phone]   Residential & Outpatient Substance Abuse Program  6572238408   Psychological Services Organization         Address  Phone  Notes  [facility]  [phone]   [facility] Services  [phone]   [facility] [address], [place] [phone] or [phone]    Mobile Crisis Teams Organization         Address  Phone  Notes  [facility], Mobile Crisis Care Unit  [phone]   [facility]  [address] [place], [place] [phone]   Doristine Locks [address] [place] [phone]    Self-Help/Support Groups Organization         Address  Phone             Notes  [facility] [place] - variety of support groups  336- I7437963 Call for more information  Narcotics Anonymous (NA), [facility] [address], [employer] [place]  2 meetings at this location   Statistician         Address  Phone  Notes  [facility] [address],    [place]  [phone]   [facility]  [address], [place] [address], [place], [place] [phone]   [facility] [address] [place], [place] [phone] Admissions: 8am-3pm M-F  [facility] [address],    [place], [place] [phone]   [facility] [address] [place], [place] [phone]   [facility] [address],  [place], [place] [phone]   [facility] - Intensive Outpatient [address], [place], [place] [phone]   [facility] ([facility] [address] [place].,  [place], [place] [phone] or [phone]   [facility] ([facility]) [address], [place], [place] [phone] Accepts Medicaid  [facility] [address]  [place] 1-800-659-3381 Substance Abuse/Addiction Treatment  ° °[place] Behavioral Health Resources °Organization         Address  Phone  Notes  °[facility]  (888) [phone]   °Julie Brannon, PhD [address] [place], [place]   (336) [phone] or (336) 951-0000   °[facility]   [address] °[place], [place] (336) 349-4454   °[facility] [address], [place], [place] ([phone]) [phone] Insurance/Medicaid/sponsorship through [facility]  °[facility] [address]                                    [place], [place] ([phone]) [phone] Therapy/tele-psych/case  °[facility] [address]  ° [place], [place] ([phone]) [phone]    °Dr. Arfeen  ([phone]) [phone]   °[facility] [place]  [employer] [place] [facility] 1) [address],  °2) [address], [place] °3)  [address], [place] ([phone]) [phone] °(336) [phone] ° °(336) [phone]   °[place] Child Abuse Hotline (336) 342-1394 or (336) 342-3537 (After Hours)    ° ° ° °

## 2013-02-19 NOTE — ED Notes (Signed)
Pt has facial pain with mild swelling and redness to the Right side of her face with pain to the upper and lower jaws.  Pt states she has not seen a dentist in years.  Pt in the mouth has been ongoing for over 1 year.  No issues swallowing.

## 2013-02-19 NOTE — ED Provider Notes (Signed)
Patient received from Domenic Moras PA-C. 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 HA responds to ibuprofen, patient wants to be checked out due to chronicity of HA. Patient discussed with Domenic Moras, plan to discharge patient if Sed rate normal.   Sed rate minimally elevated, not consistent with temporal arteritis. Patient discussed with Dr. Stark Jock. Doubt need for biopsy at this time. Recommend follow up with neurology as soon as possible for reevaluation. Advised patient to return should her HA sxs worsen or new symptoms occur. Patient afebrile with normal Vital signs. Patient appears comfortable and in NAD. Patient agrees with plan. Discharged in good condition.   Meds given in ED:  Medications  butalbital-acetaminophen-caffeine (FIORICET, ESGIC) 50-325-40 MG per tablet 1 tablet (1 tablet Oral Given 02/19/13 1614)    Discharge Medication List as of 02/19/2013  3:50 PM    START taking these medications   Details  butalbital-acetaminophen-caffeine (FIORICET) 50-325-40 MG per tablet Take 1 tablet by mouth every 6 (six) hours as needed for headache., Starting 02/19/2013, Until Sat 02/19/14, Print         Sherrie George, PA-C 02/20/13 1351

## 2013-02-21 NOTE — ED Provider Notes (Signed)
Medical screening examination/treatment/procedure(s) were performed by non-physician practitioner and as supervising physician I was immediately available for consultation/collaboration.     Veryl Speak, MD 02/21/13 0230

## 2013-02-27 ENCOUNTER — Encounter (HOSPITAL_COMMUNITY): Payer: Self-pay | Admitting: Emergency Medicine

## 2013-02-27 ENCOUNTER — Emergency Department (HOSPITAL_COMMUNITY)
Admission: EM | Admit: 2013-02-27 | Discharge: 2013-02-27 | Disposition: A | Discharge status: Home / Self Care | Payer: BC Managed Care – PPO | Attending: Emergency Medicine | Admitting: Emergency Medicine

## 2013-02-27 DIAGNOSIS — J45909 Unspecified asthma, uncomplicated: Secondary | ICD-10-CM | POA: Insufficient documentation

## 2013-02-27 DIAGNOSIS — Z79899 Other long term (current) drug therapy: Secondary | ICD-10-CM | POA: Insufficient documentation

## 2013-02-27 DIAGNOSIS — Z880 Allergy status to penicillin: Secondary | ICD-10-CM | POA: Insufficient documentation

## 2013-02-27 DIAGNOSIS — Z791 Long term (current) use of non-steroidal anti-inflammatories (NSAID): Secondary | ICD-10-CM | POA: Insufficient documentation

## 2013-02-27 DIAGNOSIS — R42 Dizziness and giddiness: Secondary | ICD-10-CM | POA: Insufficient documentation

## 2013-02-27 DIAGNOSIS — R519 Headache, unspecified: Secondary | ICD-10-CM

## 2013-02-27 DIAGNOSIS — E119 Type 2 diabetes mellitus without complications: Secondary | ICD-10-CM | POA: Insufficient documentation

## 2013-02-27 DIAGNOSIS — R51 Headache: Secondary | ICD-10-CM | POA: Insufficient documentation

## 2013-02-27 MED ORDER — IBUPROFEN 800 MG PO TABS
800.0000 mg | ORAL_TABLET | Freq: Three times a day (TID) | ORAL | Status: DC
Start: 2013-02-27 — End: 2013-03-03

## 2013-02-27 MED ORDER — DEXAMETHASONE SODIUM PHOSPHATE 10 MG/ML IJ SOLN
10.0000 mg | Freq: Once | INTRAMUSCULAR | Status: AC
  Administered 2013-02-27: 10 mg via INTRAMUSCULAR
  Filled 2013-02-27: qty 1

## 2013-02-27 MED ORDER — METOCLOPRAMIDE HCL 5 MG/ML IJ SOLN
10.0000 mg | Freq: Once | INTRAMUSCULAR | Status: AC
  Administered 2013-02-27: 10 mg via INTRAMUSCULAR
  Filled 2013-02-27: qty 2

## 2013-02-27 MED ORDER — KETOROLAC TROMETHAMINE 60 MG/2ML IM SOLN
60.0000 mg | Freq: Once | INTRAMUSCULAR | Status: AC
  Administered 2013-02-27: 60 mg via INTRAMUSCULAR
  Filled 2013-02-27: qty 2

## 2013-02-27 NOTE — ED Provider Notes (Signed)
CSN: [id number]     Arrival date & time 02/27/13  2206 History   First MD Initiated Contact with Patient 02/27/13 2225     Chief Complaint  Patient presents with  . Headache   (Consider location/radiation/quality/duration/timing/severity/associated sxs/prior Treatment) HPI Comments: Pt is a 43 y/o female with a PMHx of DM, asthma, thyroid disease and chronic headaches who presents to the ED complaining of continued headache since being seen in the ED on 1/23. She has been seen multiple times for the same, states this has been intermittent for about 1 year. Headache located on her right side, feels pressure behind her sinuses, radiating up and down the right side of her head. Admits to occasional dizziness, worse with leaning forward. Pain relieved when wearing her hat. She has been taking two fioricet at a time without relief. She has an appt with neurology scheduled for next week on 2/5. She had a CT scan at her last ED visit without any acute findings. States she was on an antibiotic int the past to see if it would help, however had no change. Denies vision change, fever, confusion, rash, neck pain or stiffness.  Patient is a 43 y.o. female presenting with headaches. The history is provided by the patient and medical records.  Headache Associated symptoms: dizziness     Past Medical History  Diagnosis Date  . Diabetes mellitus without complication   . Asthma   . Thyroid disease    Past Surgical History  Procedure Laterality Date  . Tubal ligation    . Cholecystectomy    . Cesarean section     Family History  Problem Relation Age of Onset  . Diabetes Mother   . Hypertension Mother   . Thyroid disease Mother    History  Substance Use Topics  . Smoking status: Never Smoker   . Smokeless tobacco: Not on file  . Alcohol Use: No   OB History   Grav Para Term Preterm Abortions TAB SAB Ect Mult Living                 Review of Systems  Neurological: Positive for dizziness and  headaches.  All other systems reviewed and are negative.    Allergies  Bactrim and Penicillins  Home Medications   Current Outpatient Rx  Name  Route  Sig  Dispense  Refill  . butalbital-acetaminophen-caffeine (FIORICET) 50-325-40 MG per tablet   Oral   Take 1 tablet by mouth every 6 (six) hours as needed for headache.   20 tablet   0   . albuterol (PROVENTIL HFA;VENTOLIN HFA) 108 (90 BASE) MCG/ACT inhaler   Inhalation   Inhale 2 puffs into the lungs every 6 (six) hours as needed for wheezing or shortness of breath.         Marland Kitchen ibuprofen (ADVIL,MOTRIN) 800 MG tablet   Oral   Take 1 tablet (800 mg total) by mouth 3 (three) times daily.   21 tablet   0    BP 158/84  Pulse 67  Temp(Src) 98.3 F (36.8 C) (Oral)  Resp 20  Ht 5\' 2"  (1.575 m)  Wt 207 lb (93.895 kg)  BMI 37.85 kg/m2  SpO2 100%  LMP 01/04/2013 Physical Exam  Vitals reviewed. Constitutional: She is oriented to person, place, and time. She appears well-developed and well-nourished. No distress.  HENT:  Head: Normocephalic and atraumatic.  Mouth/Throat: Oropharynx is clear and moist.  Eyes: Conjunctivae and EOM are normal. Pupils are equal, round, and reactive to light.  Neck: Normal range of motion. Neck supple.  Cardiovascular: Normal rate, regular rhythm and normal heart sounds.   Pulmonary/Chest: Effort normal and breath sounds normal.  Abdominal: Soft. Bowel sounds are normal. There is no tenderness.  Musculoskeletal: Normal range of motion. She exhibits no edema.  Neurological: She is alert and oriented to person, place, and time. She has normal strength. No cranial nerve deficit or sensory deficit. She displays a negative Romberg sign. Coordination and gait normal.  Skin: Skin is warm and dry. No rash noted. She is not diaphoretic.  Psychiatric: She has a normal mood and affect. Her behavior is normal.    ED Course  Procedures (including critical care time) Labs Review Labs Reviewed - No data to  display Imaging Review No results found.  EKG Interpretation   None       MDM   1. Headache    Pt presenting with headache, chronic for the past year. She has an appointment next week with neurology. No red flags concerning patient's headache. She is well appearing, no apparent distress, afebrile with normal vital signs. No meningeal signs. No focal neurologic deficits. Pain improved with Toradol, Decadron Reglan. Patient states her headache has completely subsided. I advised her to take ibuprofen. Return precautions given. Patient states understanding of treatment care plan and is agreeable.    Illene Labrador, PA-C 02/27/13 2305

## 2013-02-27 NOTE — ED Notes (Signed)
Pt arrived to the ED with a complaint of a headache.  Pt states she has been having headaches for about a year.  Pt states the last several days with pain in her head primarily on the right hand upper side.

## 2013-02-27 NOTE — Discharge Instructions (Signed)
Follow up with neurology.  Migraine Headache A migraine headache is an intense, throbbing pain on one or both sides of your head. A migraine can last for 30 minutes to several hours. CAUSES  The exact cause of a migraine headache is not always known. However, a migraine may be caused when nerves in the brain become irritated and release chemicals that cause inflammation. This causes pain. Certain things may also trigger migraines, such as:  Alcohol.  Smoking.  Stress.  Menstruation.  Aged cheeses.  Foods or drinks that contain nitrates, glutamate, aspartame, or tyramine.  Lack of sleep.  Chocolate.  Caffeine.  Hunger.  Physical exertion.  Fatigue.  Medicines used to treat chest pain (nitroglycerine), birth control pills, estrogen, and some blood pressure medicines. SIGNS AND SYMPTOMS  Pain on one or both sides of your head.  Pulsating or throbbing pain.  Severe pain that prevents daily activities.  Pain that is aggravated by any physical activity.  Nausea, vomiting, or both.  Dizziness.  Pain with exposure to bright lights, loud noises, or activity.  General sensitivity to bright lights, loud noises, or smells. Before you get a migraine, you may get warning signs that a migraine is coming (aura). An aura may include:  Seeing flashing lights.  Seeing bright spots, halos, or zig-zag lines.  Having tunnel vision or blurred vision.  Having feelings of numbness or tingling.  Having trouble talking.  Having muscle weakness. DIAGNOSIS  A migraine headache is often diagnosed based on:  Symptoms.  Physical exam.  A CT scan or MRI of your head. These imaging tests cannot diagnose migraines, but they can help rule out other causes of headaches. TREATMENT Medicines may be given for pain and nausea. Medicines can also be given to help prevent recurrent migraines.  HOME CARE INSTRUCTIONS  Only take over-the-counter or prescription medicines for pain or  discomfort as directed by your health care provider. The use of long-term narcotics is not recommended.  Lie down in a dark, quiet room when you have a migraine.  Keep a journal to find out what may trigger your migraine headaches. For example, write down:  What you eat and drink.  How much sleep you get.  Any change to your diet or medicines.  Limit alcohol consumption.  Quit smoking if you smoke.  Get 7 9 hours of sleep, or as recommended by your health care provider.  Limit stress.  Keep lights dim if bright lights bother you and make your migraines worse. SEEK IMMEDIATE MEDICAL CARE IF:   Your migraine becomes severe.  You have a fever.  You have a stiff neck.  You have vision loss.  You have muscular weakness or loss of muscle control.  You start losing your balance or have trouble walking.  You feel faint or pass out.  You have severe symptoms that are different from your first symptoms. MAKE SURE YOU:   Understand these instructions.  Will watch your condition.  Will get help right away if you are not doing well or get worse. Document Released: 01/14/2005 Document Revised: 11/04/2012 Document Reviewed: 09/21/2012 [facility] Patient Information 2014 [place].

## 2013-03-03 ENCOUNTER — Encounter (HOSPITAL_COMMUNITY): Payer: Self-pay | Admitting: Emergency Medicine

## 2013-03-03 ENCOUNTER — Emergency Department (HOSPITAL_COMMUNITY)
Admission: EM | Admit: 2013-03-03 | Discharge: 2013-03-03 | Disposition: A | Discharge status: Home / Self Care | Payer: BC Managed Care – PPO | Attending: Emergency Medicine | Admitting: Emergency Medicine

## 2013-03-03 DIAGNOSIS — Z79899 Other long term (current) drug therapy: Secondary | ICD-10-CM | POA: Insufficient documentation

## 2013-03-03 DIAGNOSIS — K0889 Other specified disorders of teeth and supporting structures: Secondary | ICD-10-CM

## 2013-03-03 DIAGNOSIS — E119 Type 2 diabetes mellitus without complications: Secondary | ICD-10-CM | POA: Insufficient documentation

## 2013-03-03 DIAGNOSIS — Z791 Long term (current) use of non-steroidal anti-inflammatories (NSAID): Secondary | ICD-10-CM | POA: Insufficient documentation

## 2013-03-03 DIAGNOSIS — J45909 Unspecified asthma, uncomplicated: Secondary | ICD-10-CM | POA: Insufficient documentation

## 2013-03-03 DIAGNOSIS — K089 Disorder of teeth and supporting structures, unspecified: Secondary | ICD-10-CM | POA: Insufficient documentation

## 2013-03-03 DIAGNOSIS — R519 Headache, unspecified: Secondary | ICD-10-CM

## 2013-03-03 DIAGNOSIS — J3489 Other specified disorders of nose and nasal sinuses: Secondary | ICD-10-CM | POA: Insufficient documentation

## 2013-03-03 DIAGNOSIS — Z880 Allergy status to penicillin: Secondary | ICD-10-CM | POA: Insufficient documentation

## 2013-03-03 DIAGNOSIS — R0981 Nasal congestion: Secondary | ICD-10-CM

## 2013-03-03 DIAGNOSIS — R51 Headache: Secondary | ICD-10-CM | POA: Insufficient documentation

## 2013-03-03 MED ORDER — TRAMADOL HCL 50 MG PO TABS: 50.0000 mg | ORAL_TABLET | Freq: Four times a day (QID) | ORAL | Status: DC | PRN

## 2013-03-03 MED ORDER — METOCLOPRAMIDE HCL 5 MG/ML IJ SOLN
10.0000 mg | Freq: Once | INTRAMUSCULAR | Status: AC
  Administered 2013-03-03: 10 mg via INTRAMUSCULAR
  Filled 2013-03-03: qty 2

## 2013-03-03 NOTE — ED Provider Notes (Signed)
Medical screening examination/treatment/procedure(s) were performed by non-physician practitioner and as supervising physician I was immediately available for consultation/collaboration.  EKG Interpretation   None         Tanna Furry, MD 03/03/13 [phone]

## 2013-03-03 NOTE — Discharge Instructions (Signed)
Take motrin as need.  You may also take ultram as need for pain. No driving when taking ultram.  For nasal congestion, you may try claritin or zyrtec as need for symptom relief.  For recurrent headaches, follow up with primary care doctor, and/or neurologist in the next few weeks.  For dental pain/caries, follow up with dentist in the next 1-2 weeks.   Return to ER if worse, new symptoms, fevers, persistent vomiting, facial, neck or throat swelling, other concern.    Migraine Headache A migraine headache is an intense, throbbing pain on one or both sides of your head. A migraine can last for 30 minutes to several hours. CAUSES  The exact cause of a migraine headache is not always known. However, a migraine may be caused when nerves in the brain become irritated and release chemicals that cause inflammation. This causes pain. Certain things may also trigger migraines, such as:  Alcohol.  Smoking.  Stress.  Menstruation.  Aged cheeses.  Foods or drinks that contain nitrates, glutamate, aspartame, or tyramine.  Lack of sleep.  Chocolate.  Caffeine.  Hunger.  Physical exertion.  Fatigue.  Medicines used to treat chest pain (nitroglycerine), birth control pills, estrogen, and some blood pressure medicines. SIGNS AND SYMPTOMS  Pain on one or both sides of your head.  Pulsating or throbbing pain.  Severe pain that prevents daily activities.  Pain that is aggravated by any physical activity.  Nausea, vomiting, or both.  Dizziness.  Pain with exposure to bright lights, loud noises, or activity.  General sensitivity to bright lights, loud noises, or smells. Before you get a migraine, you may get warning signs that a migraine is coming (aura). An aura may include:  Seeing flashing lights.  Seeing bright spots, halos, or zig-zag lines.  Having tunnel vision or blurred vision.  Having feelings of numbness or tingling.  Having trouble talking.  Having muscle  weakness. DIAGNOSIS  A migraine headache is often diagnosed based on:  Symptoms.  Physical exam.  A CT scan or MRI of your head. These imaging tests cannot diagnose migraines, but they can help rule out other causes of headaches. TREATMENT Medicines may be given for pain and nausea. Medicines can also be given to help prevent recurrent migraines.  HOME CARE INSTRUCTIONS  Only take over-the-counter or prescription medicines for pain or discomfort as directed by your health care provider. The use of long-term narcotics is not recommended.  Lie down in a dark, quiet room when you have a migraine.  Keep a journal to find out what may trigger your migraine headaches. For example, write down:  What you eat and drink.  How much sleep you get.  Any change to your diet or medicines.  Limit alcohol consumption.  Quit smoking if you smoke.  Get 7 9 hours of sleep, or as recommended by your health care provider.  Limit stress.  Keep lights dim if bright lights bother you and make your migraines worse. SEEK IMMEDIATE MEDICAL CARE IF:   Your migraine becomes severe.  You have a fever.  You have a stiff neck.  You have vision loss.  You have muscular weakness or loss of muscle control.  You start losing your balance or have trouble walking.  You feel faint or pass out.  You have severe symptoms that are different from your first symptoms. MAKE SURE YOU:   Understand these instructions.  Will watch your condition.  Will get help right away if you are not doing well  or get worse. Document Released: 01/14/2005 Document Revised: 11/04/2012 Document Reviewed: 09/21/2012 [facility] Patient Information 2014 [place].    Tension Headache A tension headache is a feeling of pain, pressure, or aching often felt over the front and sides of the head. The pain can be dull or can feel tight (constricting). It is the most common type of headache. Tension headaches are not  normally associated with nausea or vomiting and do not get worse with physical activity. Tension headaches can last 30 minutes to several days.  CAUSES  The exact cause is not known, but it may be caused by chemicals and hormones in the brain that lead to pain. Tension headaches often begin after stress, anxiety, or depression. Other triggers may include:  Alcohol.  Caffeine (too much or withdrawal).  Respiratory infections (colds, flu, sinus infections).  Dental problems or teeth clenching.  Fatigue.  Holding your head and neck in one position too long while using a computer. SYMPTOMS   Pressure around the head.   Dull, aching head pain.   Pain felt over the front and sides of the head.   Tenderness in the muscles of the head, neck, and shoulders. DIAGNOSIS  A tension headache is often diagnosed based on:   Symptoms.   Physical examination.   A CT scan or MRI of your head. These tests may be ordered if symptoms are severe or unusual. TREATMENT  Medicines may be given to help relieve symptoms.  HOME CARE INSTRUCTIONS   Only take over-the-counter or prescription medicines for pain or discomfort as directed by your caregiver.   Lie down in a dark, quiet room when you have a headache.   Keep a journal to find out what may be triggering your headaches. For example, write down:  What you eat and drink.  How much sleep you get.  Any change to your diet or medicines.  Try massage or other relaxation techniques.   Ice packs or heat applied to the head and neck can be used. Use these 3 to 4 times per day for 15 to 20 minutes each time, or as needed.   Limit stress.   Sit up straight, and do not tense your muscles.   Quit smoking if you smoke.  Limit alcohol use.  Decrease the amount of caffeine you drink, or stop drinking caffeine.  Eat and exercise regularly.  Get 7 to 9 hours of sleep, or as recommended by your caregiver.  Avoid excessive use of  pain medicine as recurrent headaches can occur.  SEEK MEDICAL CARE IF:   You have problems with the medicines you were prescribed.  Your medicines do not work.  You have a change from the usual headache.  You have nausea or vomiting. SEEK IMMEDIATE MEDICAL CARE IF:   Your headache becomes severe.  You have a fever.  You have a stiff neck.  You have loss of vision.  You have muscular weakness or loss of muscle control.  You lose your balance or have trouble walking.  You feel faint or pass out.  You have severe symptoms that are different from your first symptoms. MAKE SURE YOU:   Understand these instructions.  Will watch your condition.  Will get help right away if you are not doing well or get worse. Document Released: 01/14/2005 Document Revised: 04/08/2011 Document Reviewed: 01/04/2011 [facility] Patient Information 2014 [place], [place].   Dental Pain A tooth ache may be caused by cavities (tooth decay). Cavities expose the nerve of the  tooth to air and hot or cold temperatures. It may come from an infection or abscess (also called a boil or furuncle) around your tooth. It is also often caused by dental caries (tooth decay). This causes the pain you are having. DIAGNOSIS  Your caregiver can diagnose this problem by exam. TREATMENT   If caused by an infection, it may be treated with medications which kill germs (antibiotics) and pain medications as prescribed by your caregiver. Take medications as directed.  Only take over-the-counter or prescription medicines for pain, discomfort, or fever as directed by your caregiver.  Whether the tooth ache today is caused by infection or dental disease, you should see your dentist as soon as possible for further care. SEEK MEDICAL CARE IF: The exam and treatment you received today has been provided on an emergency basis only. This is not a substitute for complete medical or dental care. If your problem worsens or new problems  (symptoms) appear, and you are unable to meet with your dentist, call or return to this location. SEEK IMMEDIATE MEDICAL CARE IF:   You have a fever.  You develop redness and swelling of your face, jaw, or neck.  You are unable to open your mouth.  You have severe pain uncontrolled by pain medicine. MAKE SURE YOU:   Understand these instructions.  Will watch your condition.  Will get help right away if you are not doing well or get worse. Document Released: 01/14/2005 Document Revised: 04/08/2011 Document Reviewed: 09/02/2007 [facility] Patient Information 2014 [place].

## 2013-03-03 NOTE — ED Notes (Addendum)
Pt reports nasal congestion with a HA, pt has been taking 800mg  Ibuprofen but has not relieved the pain. Pt states she believes the pain is from a tooth on her right side, swelling noted to R side of face. Pt a&o x4, ambulatory to triage.

## 2013-03-03 NOTE — ED Provider Notes (Signed)
CSN: [id number]     Arrival date & time 03/03/13  0012 History   First MD Initiated Contact with Patient 03/03/13 0037     Chief Complaint  Patient presents with  . Nasal Congestion  . Headache   (Consider location/radiation/quality/duration/timing/severity/associated sxs/prior Treatment) Patient is a 43 y.o. female presenting with headaches. The history is provided by the patient.  Headache Associated symptoms: congestion   Associated symptoms: no abdominal pain, no back pain, no pain, no fever, no neck pain, no numbness, no sore throat and no vomiting   pt c/o right frontal headache intermittently for the past several weeks. Similar to prior headaches. ?hx migraines. Denies any recent head trauma, or fall. Pain dull, moderate, throbbing at times, non radiating. Sl nausea. No vomiting. Denies eye pain or change in vision. No change in headache by time of day, activity, or head/body position. No wt loss or gain. No numbness/weakness. No change in normal function, gait or coordination. Mild sinus congestion, no purulent drainage. No sore throat. No fever or chills. Also notes right lower tooth pain for past couple days, states same pain in past. No jaw or facial swelling or redness. No swelling, pain, or tenderness to neck or floor of mouth. No trismus. Tried motrin without relief. ?mild phono/photophobia. No neck pain or stiffness.     Past Medical History  Diagnosis Date  . Diabetes mellitus without complication   . Asthma   . Thyroid disease    Past Surgical History  Procedure Laterality Date  . Tubal ligation    . Cholecystectomy    . Cesarean section     Family History  Problem Relation Age of Onset  . Diabetes Mother   . Hypertension Mother   . Thyroid disease Mother    History  Substance Use Topics  . Smoking status: Never Smoker   . Smokeless tobacco: Not on file  . Alcohol Use: No   OB History   Grav Para Term Preterm Abortions TAB SAB Ect Mult Living                  Review of Systems  Constitutional: Negative for fever and chills.  HENT: Positive for congestion and dental problem. Negative for sore throat and trouble swallowing.   Eyes: Negative for pain, redness and visual disturbance.  Respiratory: Negative for shortness of breath.   Cardiovascular: Negative for chest pain.  Gastrointestinal: Negative for vomiting and abdominal pain.  Genitourinary: Negative for flank pain.  Musculoskeletal: Negative for back pain and neck pain.  Skin: Negative for rash.  Neurological: Positive for headaches. Negative for syncope, weakness, light-headedness and numbness.  Hematological: Does not bruise/bleed easily.  Psychiatric/Behavioral: Negative for confusion.    Allergies  Bactrim and Penicillins  Home Medications   Current Outpatient Rx  Name  Route  Sig  Dispense  Refill  . albuterol (PROVENTIL HFA;VENTOLIN HFA) 108 (90 BASE) MCG/ACT inhaler   Inhalation   Inhale 2 puffs into the lungs every 6 (six) hours as needed for wheezing or shortness of breath.         . butalbital-acetaminophen-caffeine (FIORICET) 50-325-40 MG per tablet   Oral   Take 1 tablet by mouth every 6 (six) hours as needed for headache.   20 tablet   0   . ibuprofen (ADVIL,MOTRIN) 800 MG tablet   Oral   Take 1 tablet (800 mg total) by mouth 3 (three) times daily.   21 tablet   0    BP 173/99  Pulse  86  Temp(Src) 98.9 F (37.2 C) (Oral)  Resp 16  Wt 207 lb (93.895 kg)  SpO2 98%  LMP 01/04/2013 Physical Exam  Nursing note and vitals reviewed. Constitutional: She is oriented to person, place, and time. She appears well-developed and well-nourished. No distress.  HENT:  Head: Atraumatic.  Mouth/Throat: Oropharynx is clear and moist.  No sinus or temporal tenderness.mild nasal congestion. Several decayed teeth, incl right lower. No gum swelling or focal gum tenderness. No facial swelling. No trismus. No swelling, pain or tenderness to floor of mouth or neck.    Eyes: Conjunctivae and EOM are normal. Pupils are equal, round, and reactive to light. No scleral icterus.  Neck: Neck supple. No tracheal deviation present. No thyromegaly present.  No stiffness or rigidity.   Cardiovascular: Normal rate, regular rhythm, normal heart sounds and intact distal pulses.  Exam reveals no gallop and no friction rub.   No murmur heard. Pulmonary/Chest: Effort normal and breath sounds normal. No respiratory distress.  Abdominal: Soft. Normal appearance and bowel sounds are normal. She exhibits no distension. There is no tenderness.  Genitourinary:  No cva tenderness.  Musculoskeletal: Normal range of motion. She exhibits no edema and no tenderness.  Neurological: She is alert and oriented to person, place, and time. No cranial nerve deficit.  No pronator drift. Motor intact bilaterally, 5/5. Coordination intact. Steady gait.   Skin: Skin is warm and dry. No rash noted. She is not diaphoretic.  Psychiatric: She has a normal mood and affect.    ED Course  Procedures (including critical care time)   MDM  Pt drove self to ED.  reglan im.  Recheck pt comfortable.  Pt had head ct 2 weeks ago for eval headaches, neg acute. Was referred to neurology - hasnt yet followed up.    pt feels improved. No nv. Appears stable for d/c.        Mirna Mires, MD 03/03/13 [phone]

## 2013-05-10 ENCOUNTER — Emergency Department (HOSPITAL_COMMUNITY)
Admission: EM | Admit: 2013-05-10 | Discharge: 2013-05-10 | Disposition: A | Discharge status: Home / Self Care | Payer: BC Managed Care – PPO | Attending: Emergency Medicine | Admitting: Emergency Medicine

## 2013-05-10 DIAGNOSIS — M549 Dorsalgia, unspecified: Secondary | ICD-10-CM | POA: Insufficient documentation

## 2013-05-10 DIAGNOSIS — R51 Headache: Secondary | ICD-10-CM | POA: Insufficient documentation

## 2013-05-10 DIAGNOSIS — Z880 Allergy status to penicillin: Secondary | ICD-10-CM | POA: Insufficient documentation

## 2013-05-10 DIAGNOSIS — J069 Acute upper respiratory infection, unspecified: Secondary | ICD-10-CM | POA: Insufficient documentation

## 2013-05-10 DIAGNOSIS — Z79899 Other long term (current) drug therapy: Secondary | ICD-10-CM | POA: Insufficient documentation

## 2013-05-10 DIAGNOSIS — M542 Cervicalgia: Secondary | ICD-10-CM | POA: Insufficient documentation

## 2013-05-10 DIAGNOSIS — Z9851 Tubal ligation status: Secondary | ICD-10-CM | POA: Insufficient documentation

## 2013-05-10 DIAGNOSIS — Z9089 Acquired absence of other organs: Secondary | ICD-10-CM | POA: Insufficient documentation

## 2013-05-10 DIAGNOSIS — E119 Type 2 diabetes mellitus without complications: Secondary | ICD-10-CM | POA: Insufficient documentation

## 2013-05-10 DIAGNOSIS — J45909 Unspecified asthma, uncomplicated: Secondary | ICD-10-CM | POA: Insufficient documentation

## 2013-05-10 MED ORDER — TRAMADOL HCL 50 MG PO TABS: 50.0000 mg | ORAL_TABLET | Freq: Four times a day (QID) | ORAL | Status: DC | PRN

## 2013-05-10 NOTE — ED Notes (Addendum)
Pt rpeorts her husband just came back from traveling to [place]. He came back on April 4th, husband is not sick at all.  Pt reports on 4/11 started having congestion/ cold, neck pain x3 months, reports yesterday had increased pain in neck. Reports slight difficulty breathing, pain in neck 6/10. Back pain started 3 days ago.   Charge rn called infection control and explained situation, infection control stated pt should not be an ebola risk because pts husband has not been sick at all, and pt has not been around anyone from [place] that has been sick in last 21 days.

## 2013-05-10 NOTE — ED Provider Notes (Signed)
CSN: [id number]     Arrival date & time 05/10/13  1216 History  This chart was scribed for non-physician practitioner, Quincy Carnes, PA-C, working with Janice Norrie, MD, by Sydell Axon, ED Scribe. This patient was seen in room [facility] and the patient's care was started at 1:10 PM.  The history is provided by the patient. No language interpreter was used.   HPI Comments: [name] is a 43 y.o. female who presents to the Emergency Department with a chief complaint of recurrent neck pain and new cold symptoms. Patient reports that her neck pain began three months, has been intermittent since then, ago and that her cold symptoms, including congestion, sinus pressure, and HA began two days ago.  No visual disturbance, tinnitus, confusion, changes in speech.  She states her neck pain is worse on the left side of her neck and radiates to the L shoulder. Additionally she confirms neck soreness and stiffness, worse with turning her head to the left. She denies any recent injuries or falls.  She denies any otalgia. Patient denies any seasonal allergies. She confirms an allergy of bactrim and penicillin. Patient confirms her husband had recently been to [place] for 4 months and recently returned; however, has not been sick. She denies being around any sick contacts recently or having recently traveled.  No fevers or chills at home.  Past Medical History  Diagnosis Date  . Diabetes mellitus without complication   . Asthma   . Thyroid disease    Past Surgical History  Procedure Laterality Date  . Tubal ligation    . Cholecystectomy    . Cesarean section     Family History  Problem Relation Age of Onset  . Diabetes Mother   . Hypertension Mother   . Thyroid disease Mother    History  Substance Use Topics  . Smoking status: Never Smoker   . Smokeless tobacco: Not on file  . Alcohol Use: No   OB History   Grav Para Term Preterm Abortions TAB SAB Ect Mult Living                 Review of Systems   Constitutional: Negative for fever, chills, activity change and appetite change.  HENT: Positive for congestion and sinus pressure. Negative for ear discharge, ear pain, postnasal drip, rhinorrhea, sneezing, sore throat and voice change.   Respiratory: Negative for shortness of breath.   Gastrointestinal: Negative for nausea, vomiting, abdominal pain and diarrhea.  Musculoskeletal: Positive for neck pain.  Neurological: Positive for headaches. Negative for dizziness, weakness, light-headedness and numbness.  All other systems reviewed and are negative.  Allergies  Bactrim and Penicillins  Home Medications   Current Outpatient Rx  Name  Route  Sig  Dispense  Refill  . albuterol (PROVENTIL HFA;VENTOLIN HFA) 108 (90 BASE) MCG/ACT inhaler   Inhalation   Inhale 2 puffs into the lungs every 6 (six) hours as needed for wheezing or shortness of breath.         Marland Kitchen ibuprofen (ADVIL,MOTRIN) 800 MG tablet   Oral   Take 800 mg by mouth every 8 (eight) hours as needed for moderate pain.         . traMADol (ULTRAM) 50 MG tablet   Oral   Take 1 tablet (50 mg total) by mouth every 6 (six) hours as needed.   20 tablet   0    Triage Vitals: BP 140/96  Pulse 81  Temp(Src) 98.2 F (36.8 C) (Oral)  Resp 16  SpO2 100%  Physical Exam  Nursing note and vitals reviewed. Constitutional: She is oriented to person, place, and time. She appears well-developed and well-nourished.  HENT:  Head: Normocephalic and atraumatic.  Right Ear: Tympanic membrane and ear canal normal.  Left Ear: Tympanic membrane and ear canal normal.  Nose: Mucosal edema present.  Mouth/Throat: Uvula is midline, oropharynx is clear and moist and mucous membranes are normal. No oropharyngeal exudate, posterior oropharyngeal edema, posterior oropharyngeal erythema or tonsillar abscesses.  Eyes: Conjunctivae and EOM are normal. Pupils are equal, round, and reactive to light.  Neck: Normal range of motion and full passive  range of motion without pain. Neck supple. Muscular tenderness present. No spinous process tenderness present. No rigidity.  Tenderness to palpation of the left trapezius, no midline tenderness, step-off, or deformity; full ROM maintained; no nuchal rigidity  Cardiovascular: Normal rate, regular rhythm and normal heart sounds.   Pulmonary/Chest: Effort normal and breath sounds normal.  Abdominal: Soft. Bowel sounds are normal.  Musculoskeletal: Normal range of motion.  Neurological: She is alert and oriented to person, place, and time.  Skin: Skin is warm and dry.  Psychiatric: She has a normal mood and affect.    ED Course  Procedures (including critical care time)  DIAGNOSTIC STUDIES: Oxygen Saturation is 100% on room air, normal by my interpretation.    COORDINATION OF CARE: 1:13 PM-Discussed my low suspicion of a lung infection and likelihood of a bad cold. Treatment plan discussed with patient and patient agrees.  Labs Review Labs Reviewed - No data to display Imaging Review No results found.   EKG Interpretation None      MDM   Final diagnoses:  URI (upper respiratory infection)  Neck pain  Back pain   Patient with intermittent neck pain for the past 3 months, no new injuries or trauma. New cold symptoms over the past week, no reported fevers or chills. Do not feel patient's symptoms today represent meningitis. She is afebrile, overall nontoxic appearing and without nuchal rigidity. She has full range of motion of her neck. Constellation of symptoms likely viral in nature, pt was instructed on supportive care at home.  Her husband did recently return from [place], however he has not been sick and patient has had no contact with sick patients who have traveled to [place] within the past 21 days.  Confirmed with infection control that pt is not an ebola risk at this time.  Rx tramadol.  FU with PCP.  Discussed plan with pt, they agreed.  Return precautions advised for new or  worsening symptoms.  I personally performed the services described in this documentation, which was scribed in my presence. The recorded information has been reviewed and is accurate.  Larene Pickett, PA-C 05/10/13 [place], PA-C 05/10/13 1755

## 2013-05-10 NOTE — Discharge Instructions (Signed)
May use over the counter cold medications as needed to help with symptom control. Drink plenty of fluids to stay hydrated, rest. Take tramadol for neck and back pain.   Return to the ED for new or worsening symptoms.

## 2013-05-12 NOTE — ED Provider Notes (Signed)
Medical screening examination/treatment/procedure(s) were performed by non-physician practitioner and as supervising physician I was immediately available for consultation/collaboration.   EKG Interpretation None      Tiesha Marich, MD, FACEP   Jaiyana Canale L Illiana Losurdo, MD 05/12/13 1259 

## 2013-08-30 ENCOUNTER — Encounter (HOSPITAL_COMMUNITY): Payer: Self-pay | Admitting: Emergency Medicine

## 2013-08-30 ENCOUNTER — Emergency Department (HOSPITAL_COMMUNITY)
Admission: EM | Admit: 2013-08-30 | Discharge: 2013-08-30 | Disposition: A | Discharge status: Home / Self Care | Payer: BC Managed Care – PPO | Attending: Emergency Medicine | Admitting: Emergency Medicine

## 2013-08-30 DIAGNOSIS — Z880 Allergy status to penicillin: Secondary | ICD-10-CM | POA: Insufficient documentation

## 2013-08-30 DIAGNOSIS — R51 Headache: Secondary | ICD-10-CM | POA: Insufficient documentation

## 2013-08-30 DIAGNOSIS — K089 Disorder of teeth and supporting structures, unspecified: Secondary | ICD-10-CM | POA: Insufficient documentation

## 2013-08-30 DIAGNOSIS — J45909 Unspecified asthma, uncomplicated: Secondary | ICD-10-CM | POA: Insufficient documentation

## 2013-08-30 DIAGNOSIS — H9209 Otalgia, unspecified ear: Secondary | ICD-10-CM | POA: Insufficient documentation

## 2013-08-30 DIAGNOSIS — K029 Dental caries, unspecified: Secondary | ICD-10-CM

## 2013-08-30 DIAGNOSIS — E119 Type 2 diabetes mellitus without complications: Secondary | ICD-10-CM | POA: Insufficient documentation

## 2013-08-30 DIAGNOSIS — Z79899 Other long term (current) drug therapy: Secondary | ICD-10-CM | POA: Insufficient documentation

## 2013-08-30 DIAGNOSIS — H9201 Otalgia, right ear: Secondary | ICD-10-CM

## 2013-08-30 MED ORDER — MELOXICAM 7.5 MG PO TABS: 15.0000 mg | ORAL_TABLET | Freq: Every day | ORAL | Status: DC

## 2013-08-30 MED ORDER — CLINDAMYCIN HCL 150 MG PO CAPS: 150.0000 mg | ORAL_CAPSULE | Freq: Four times a day (QID) | ORAL | Status: DC

## 2013-08-30 NOTE — ED Provider Notes (Signed)
CSN: [id number]     Arrival date & time 08/30/13  1447 History  This chart was scribed for non-physician practitioner, Noland Fordyce, PA-C working with Johnna Acosta, MD by Frederich Balding, ED scribe. This patient was seen in room WTR6/WTR6 and the patient's care was started at 4:24 PM.   Chief Complaint  Patient presents with  . Dental Pain  . Otalgia   The history is provided by the patient. No language interpreter was used.   HPI Comments: [name] is a 43 y.o. female who presents to the Emergency Department complaining of gradual onset throbbing right lower dental pain that radiates into her right ear that started 5 days ago. Reports associated headache. Per triage note, pt has tried to schedule a dentist appointment but has been unsuccessful, however pt then stated she does have insurance for a dentist but states it costs her $500 for teeth to be removed. She has taken tylenol with no relief. Denies fever, trouble swallowing, difficulty breathing, nausea, emesis.   Past Medical History  Diagnosis Date  . Diabetes mellitus without complication   . Asthma   . Thyroid disease    Past Surgical History  Procedure Laterality Date  . Tubal ligation    . Cholecystectomy    . Cesarean section     Family History  Problem Relation Age of Onset  . Diabetes Mother   . Hypertension Mother   . Thyroid disease Mother    History  Substance Use Topics  . Smoking status: Never Smoker   . Smokeless tobacco: Not on file  . Alcohol Use: No   OB History   Grav Para Term Preterm Abortions TAB SAB Ect Mult Living                 Review of Systems  Constitutional: Negative for fever.  HENT: Positive for dental problem and ear pain. Negative for trouble swallowing.   Gastrointestinal: Negative for nausea and vomiting.  Neurological: Positive for headaches.  All other systems reviewed and are negative.  Allergies  Bactrim and Penicillins  Home Medications   Prior to Admission  medications   Medication Sig Start Date End Date Taking? Authorizing Provider  acetaminophen (TYLENOL) 500 MG tablet Take 1,000 mg by mouth every 6 (six) hours as needed for moderate pain.   Yes Historical Provider, MD  albuterol (PROVENTIL HFA;VENTOLIN HFA) 108 (90 BASE) MCG/ACT inhaler Inhale 2 puffs into the lungs every 6 (six) hours as needed for wheezing or shortness of breath.   Yes Historical Provider, MD  clindamycin (CLEOCIN) 150 MG capsule Take 1 capsule (150 mg total) by mouth every 6 (six) hours. 08/30/13   Noland Fordyce, PA-C  meloxicam (MOBIC) 7.5 MG tablet Take 2 tablets (15 mg total) by mouth daily. 08/30/13   Noland Fordyce, PA-C   BP 147/93  Pulse 84  Temp(Src) 98 F (36.7 C) (Oral)  Resp 16  SpO2 98%  Physical Exam  Nursing note and vitals reviewed. Constitutional: She is oriented to person, place, and time. She appears well-developed and well-nourished. No distress.  HENT:  Head: Normocephalic and atraumatic.  Diffuse dental caries. Filling in place in right lower second to last molar with tenderness to tooth. No gingival abscess. Right TM normal.   Eyes: Conjunctivae and EOM are normal.  Neck: Neck supple. No tracheal deviation present.  Cardiovascular: Normal rate.   Pulmonary/Chest: Effort normal. No respiratory distress.  Musculoskeletal: Normal range of motion.  Neurological: She is alert and oriented to person,  place, and time.  Skin: Skin is warm and dry.  Psychiatric: She has a normal mood and affect. Her behavior is normal.    ED Course  Procedures (including critical care time)  DIAGNOSTIC STUDIES: Oxygen Saturation is 98% on RA, normal by my interpretation.    COORDINATION OF CARE: 4:26 PM-Discussed treatment plan which includes pain medication and an antibiotic with pt at bedside and pt agreed to plan. Advised pt to follow up with a dentist.   Labs Review Labs Reviewed - No data to display  Imaging Review No results found.   EKG  Interpretation None      MDM   Final diagnoses:  Pain due to dental caries  Right ear pain    Pt is a 43yo female with diffuse dental caries c/o right lower dental pain x5 days.  No gingival abscess present on exam however due to diffuse dental caries, will start pt on clindamycin and give mobic for pain. Advised to f/u with her dentist, contact information for Dr. Radford Pax, DDS, as well as dental resource guide also provided. Return precautions provided. Pt verbalized understanding and agreement with tx plan.  I personally performed the services described in this documentation, which was scribed in my presence. The recorded information has been reviewed and is accurate.  Noland Fordyce, PA-C 08/30/13 [phone]

## 2013-08-30 NOTE — Discharge Instructions (Signed)
Dental Caries Dental caries is tooth decay. This decay can cause a hole in teeth (cavity) that can get bigger and deeper over time. HOME CARE  Brush and floss your teeth. Do this at least two times a day.  Use a fluoride toothpaste.  Use a mouth rinse if told by your dentist or doctor.  Eat less sugary and starchy foods. Drink less sugary drinks.  Avoid snacking often on sugary and starchy foods. Avoid sipping often on sugary drinks.  Keep regular checkups and cleanings with your dentist.  Use fluoride supplements if told by your dentist or doctor.  Allow fluoride to be applied to teeth if told by your dentist or doctor. Document Released: 10/24/2007 Document Revised: 05/31/2013 Document Reviewed: 01/17/2012 [facility] Patient Information 2015 [place], [place]. This information is not intended to replace advice given to you by your health care provider. Make sure you discuss any questions you have with your health care provider.  Dental Care and Dentist Visits Dental care supports good overall health. Regular dental visits can also help you avoid dental pain, bleeding, infection, and other more serious health problems in the future. It is important to keep the mouth healthy because diseases in the teeth, gums, and other oral tissues can spread to other areas of the body. Some problems, such as diabetes, heart disease, and pre-term labor have been associated with poor oral health.  See your dentist every 6 months. If you experience emergency problems such as a toothache or broken tooth, go to the dentist right away. If you see your dentist regularly, you may catch problems early. It is easier to be treated for problems in the early stages.  WHAT TO EXPECT AT A DENTIST VISIT  Your dentist will look for many common oral health problems and recommend proper treatment. At your regular dental visit, you can expect:  Gentle cleaning of the teeth and gums. This includes scraping and polishing. This  helps to remove the sticky substance around the teeth and gums (plaque). Plaque forms in the mouth shortly after eating. Over time, plaque hardens on the teeth as tartar. If tartar is not removed regularly, it can cause problems. Cleaning also helps remove stains.  Periodic X-rays. These pictures of the teeth and supporting bone will help your dentist assess the health of your teeth.  Periodic fluoride treatments. Fluoride is a natural mineral shown to help strengthen teeth. Fluoride treatmentinvolves applying a fluoride gel or varnish to the teeth. It is most commonly done in children.  Examination of the mouth, tongue, jaws, teeth, and gums to look for any oral health problems, such as:  Cavities (dental caries). This is decay on the tooth caused by plaque, sugar, and acid in the mouth. It is best to catch a cavity when it is small.  Inflammation of the gums caused by plaque buildup (gingivitis).  Problems with the mouth or malformed or misaligned teeth.  Oral cancer or other diseases of the soft tissues or jaws. KEEP YOUR TEETH AND GUMS HEALTHY For healthy teeth and gums, follow these general guidelines as well as your dentist's specific advice:  Have your teeth professionally cleaned at the dentist every 6 months.  Brush twice daily with a fluoride toothpaste.  Floss your teeth daily.  Ask your dentist if you need fluoride supplements, treatments, or fluoride toothpaste.  Eat a healthy diet. Reduce foods and drinks with added sugar.  Avoid smoking. TREATMENT FOR ORAL HEALTH PROBLEMS If you have oral health problems, treatment varies depending on  the conditions present in your teeth and gums.  Your caregiver will most likely recommend good oral hygiene at each visit.  For cavities, gingivitis, or other oral health disease, your caregiver will perform a procedure to treat the problem. This is typically done at a separate appointment. Sometimes your caregiver will refer you to  another dental specialist for specific tooth problems or for surgery. SEEK IMMEDIATE DENTAL CARE IF:  You have pain, bleeding, or soreness in the gum, tooth, jaw, or mouth area.  A permanent tooth becomes loose or separated from the gum socket.  You experience a blow or injury to the mouth or jaw area. Document Released: 09/26/2010 Document Revised: 04/08/2011 Document Reviewed: 09/26/2010 [facility] Patient Information 2015 [place], [place]. This information is not intended to replace advice given to you by your health care provider. Make sure you discuss any questions you have with your health care provider.  Emergency Department Resource Guide 1) Find a Doctor and Pay Out of Pocket Although you won't have to find out who is covered by your insurance plan, it is a good idea to ask around and get recommendations. You will then need to call the office and see if the doctor you have chosen will accept you as a new patient and what types of options they offer for patients who are self-pay. Some doctors offer discounts or will set up payment plans for their patients who do not have insurance, but you will need to ask so you aren't surprised when you get to your appointment.  2) Contact Your Local Health Department Not all health departments have doctors that can see patients for sick visits, but many do, so it is worth a call to see if yours does. If you don't know where your local health department is, you can check in your phone book. The CDC also has a tool to help you locate your state's health department, and many state websites also have listings of all of their local health departments.  3) Find a [facility] If your illness is not likely to be very severe or complicated, you may want to try a walk in clinic. These are popping up all over the country in pharmacies, drugstores, and shopping centers. They're usually staffed by nurse practitioners or physician assistants that have been trained to  treat common illnesses and complaints. They're usually fairly quick and inexpensive. However, if you have serious medical issues or chronic medical problems, these are probably not your best option.  No Primary Care Doctor: - Call Health Connect at  [phone] - they can help you locate a primary care doctor that  accepts your insurance, provides certain services, etc. - Physician Referral Service- [phone]  Chronic Pain Problems: Organization         Address  Phone   Notes  [facility]  [phone] Patients need to be referred by their primary care doctor.   Medication Assistance: Organization         Address  Phone   Notes  [facility] [place] [place], [place] [address] [phone] --Must be a resident of [facility] -- Must have NO insurance coverage whatsoever (no Medicaid/ Medicare, etc.) -- The pt. MUST have a primary care doctor that directs their care regularly and follows them in the community   [facility]  [phone]   [employer]  [phone]    Agencies that provide inexpensive medical care: Organization  Address                                                       Phone                                                                            Notes  [place]  [phone]   [facility]    [phone]   [facility], [place] [address] [phone]   [place] [address] [place] [place] [phone]   Planned Parenthood    [phone]   [facility]    [phone]   [place] and [place] [address], [place] Phone:  [phone], Fax:  [phone] Hours of Operation:  9 am - 6 pm, M-F.  Also accepts Medicaid/Medicare and self-pay.  [facility] for [place] [address], [address], [place] Phone: [phone], Fax: [phone]. Hours of Operation:  8:30 am - 5:30 pm, M-F.  Also accepts Medicaid and self-pay.  [facility] [place] [address] [place] Phone: [phone]   [place], [place], [place] [phone], Ext. [phone] Mondays & Thursdays: 7-9 AM.  First 15 patients are seen on a first come, first serve basis.    [place] Providers:  Organization         Address                                                                       Phone                               Notes  [facility] [address],  [phone] Also accepts self-pay patients.  [facility] [address], [place]  [phone]   [place], [address], [place] [phone]   [facility] [address], [place] [phone]   Lucianne Lei [address], [place]   [phone] Only accepts [place] Access [place] patients after they have their name applied to their card.   Self-Pay (no insurance) in [facility]:   [employer]  Phone               Notes  Sickle Cell Patients, [facility] [place] [phone]   [facility] [place] [phone]   [facility] [place]  [place], [address], [place] [phone]   [facility]/Dr. Osei-Bonsu  [address], [place] or [address], [place] [phone] Phone number for both [place] and [place] locations is the same.  [facility] [address] [place] [phone]   [facility] [address] [place] or [address] [phone]   [facility]  [address] [place] [phone], phone; [phone], fax Sees patients 1st and 3rd Saturday of every month.  Must not qualify for public or private insurance (i.e. Medicaid, Medicare, [place], Veterans' Benefits)  Household income should be no more than 200% of the poverty level The clinic cannot treat you if you are pregnant or think you are pregnant  Sexually transmitted diseases are not treated at the clinic.   ____________Dental Care:__________________________ [employer]         Address                                  Phone                       Notes  [facility] [phone] Accepts children up to age 48 who are enrolled in [place] or [place]; pregnant women with a Medicaid card; and children who have applied for Medicaid or [place] Health Choice, but were declined, whose parents can pay a reduced fee at time of service.  [facility]  [address], [place] [phone] Accepts children up to age 71 who are enrolled in [place] or [place]; pregnant women with a Medicaid card; and children who have applied for Medicaid or [place] Health Choice, but were declined, whose parents can pay a reduced fee at time of service.  [place] [facility]  [place] [phone] Patients are seen by appointment only. Walk-ins are not accepted. [place] will see patients 10 years of age and older. Monday - Tuesday (8am-5pm) Most Wednesdays (8:30-5pm) $30 per visit, cash only  [facility]  [address], [facility] [phone] Patients are seen by appointment only. Walk-ins are not accepted. [place] will see patients 70 years of age and older. One Wednesday Evening (Monthly: Volunteer Based).  $30 per visit, cash only  [place]  [phone] for adults; Children under age 72, call Graduate Pediatric Dentistry at [phone]. Children aged 26-14, please call [phone] to request a pediatric application.  Dental services are provided in all areas of dental care including fillings, crowns and bridges, complete and partial dentures, implants, gum treatment, root canals, and extractions. Preventive care is also provided. Treatment is provided to both adults and children. Patients are selected via a lottery and there is often a waiting list.   [facility] [address], [place]  [phone])  [phone] [email or web address]   [facility] [address] [place], [place] [phone], Ext. [phone] Second and Fourth Thursday of each month, opens at 6:30 AM; Clinic ends at 9 AM.  Patients are seen on a first-come first-served basis, and a limited number are seen during each clinic.   [facility]  [address] [place], [place] [phone]   Eligibility Requirements You must have lived in [place], [place], or [place] counties for at least the last three months.   You cannot be eligible for state or federal sponsored [employer], including [employer], [place], or [employer].   You generally cannot be eligible for healthcare insurance through your employer.    How to apply: Eligibility screenings are held every Tuesday and Wednesday afternoon from 1:00 pm until 4:00 pm. You do not need an appointment for the interview!  [facility] [address], [place], [place]   [place]  [place]  [place]  [phone]

## 2013-08-30 NOTE — ED Notes (Signed)
Initial Contact - pt sitting up in chair, reports c/o R sided dental and ear pain, 7/10 pain currently.  Pt reports unable to get into a dentist.  Skin PWD.  Speaking full/clear sentences, rr even/un-lab.  No difficulty clearing secretions or maintaining airway.  NAD.

## 2013-08-30 NOTE — ED Notes (Signed)
Pt c/o R side lower dental pain and R ear pain x 5 days.  Pain score 7/10.  Pt reports that she has not been able to schedule a dental appointment.

## 2013-08-30 NOTE — ED Provider Notes (Signed)
Medical screening examination/treatment/procedure(s) were performed by non-physician practitioner and as supervising physician I was immediately available for consultation/collaboration.    Johnna Acosta, MD 08/30/13 [phone]

## 2013-09-24 ENCOUNTER — Encounter (HOSPITAL_COMMUNITY): Payer: Self-pay | Admitting: *Deleted

## 2013-09-24 ENCOUNTER — Inpatient Hospital Stay (HOSPITAL_COMMUNITY)
Admission: AD | Admit: 2013-09-24 | Discharge: 2013-09-24 | Disposition: A | Discharge status: Home / Self Care | Payer: BC Managed Care – PPO | Source: Ambulatory Visit | Attending: Obstetrics & Gynecology | Admitting: Obstetrics & Gynecology

## 2013-09-24 DIAGNOSIS — M545 Low back pain, unspecified: Secondary | ICD-10-CM | POA: Diagnosis not present

## 2013-09-24 DIAGNOSIS — N949 Unspecified condition associated with female genital organs and menstrual cycle: Secondary | ICD-10-CM | POA: Diagnosis not present

## 2013-09-24 DIAGNOSIS — N938 Other specified abnormal uterine and vaginal bleeding: Secondary | ICD-10-CM | POA: Insufficient documentation

## 2013-09-24 DIAGNOSIS — N925 Other specified irregular menstruation: Secondary | ICD-10-CM | POA: Insufficient documentation

## 2013-09-24 DIAGNOSIS — D509 Iron deficiency anemia, unspecified: Secondary | ICD-10-CM

## 2013-09-24 DIAGNOSIS — N939 Abnormal uterine and vaginal bleeding, unspecified: Secondary | ICD-10-CM

## 2013-09-24 LAB — CBC
HCT: 30.4 % — ABNORMAL LOW (ref 36.0–46.0)
HEMOGLOBIN: 10.2 g/dL — AB (ref 12.0–15.0)
MCH: 30.4 pg (ref 26.0–34.0)
MCHC: 33.6 g/dL (ref 30.0–36.0)
MCV: 90.5 fL (ref 78.0–100.0)
Platelets: 295 10*3/uL (ref 150–400)
RBC: 3.36 MIL/uL — ABNORMAL LOW (ref 3.87–5.11)
RDW: 13.5 % (ref 11.5–15.5)
WBC: 8.2 10*3/uL (ref 4.0–10.5)

## 2013-09-24 LAB — URINALYSIS, ROUTINE W REFLEX MICROSCOPIC
Bilirubin Urine: NEGATIVE
Glucose, UA: NEGATIVE mg/dL
KETONES UR: NEGATIVE mg/dL
NITRITE: NEGATIVE
PH: 7 (ref 5.0–8.0)
Protein, ur: NEGATIVE mg/dL
Specific Gravity, Urine: 1.015 (ref 1.005–1.030)
Urobilinogen, UA: 1 mg/dL (ref 0.0–1.0)

## 2013-09-24 LAB — URINE MICROSCOPIC-ADD ON

## 2013-09-24 LAB — WET PREP, GENITAL
Trich, Wet Prep: NONE SEEN
Yeast Wet Prep HPF POC: NONE SEEN

## 2013-09-24 LAB — POCT PREGNANCY, URINE: Preg Test, Ur: NEGATIVE

## 2013-09-24 MED ORDER — MEGESTROL ACETATE 40 MG PO TABS: ORAL_TABLET | ORAL | Status: DC

## 2013-09-24 MED ORDER — FERROUS SULFATE 325 (65 FE) MG PO TABS: 325.0000 mg | ORAL_TABLET | Freq: Two times a day (BID) | ORAL | Status: DC

## 2013-09-24 MED ORDER — DOCUSATE SODIUM 100 MG PO CAPS: 100.0000 mg | ORAL_CAPSULE | Freq: Two times a day (BID) | ORAL | Status: DC | PRN

## 2013-09-24 NOTE — MAU Provider Note (Signed)
Attestation of Attending Supervision of Advanced Practitioner (CNM/NP): Evaluation and management procedures were performed by the Advanced Practitioner under my supervision and collaboration.  I have reviewed the Advanced Practitioner's note and chart, and I agree with the management and plan.  HARRAWAY-SMITH, Patriece Archbold 10:45 PM

## 2013-09-24 NOTE — Discharge Instructions (Signed)
Abnormal Uterine Bleeding Abnormal uterine bleeding can affect women at various stages in life, including teenagers, women in their reproductive years, pregnant women, and women who have reached menopause. Several kinds of uterine bleeding are considered abnormal, including:  Bleeding or spotting between periods.   Bleeding after sexual intercourse.   Bleeding that is heavier or more than normal.   Periods that last longer than usual.  Bleeding after menopause.  Many cases of abnormal uterine bleeding are minor and simple to treat, while others are more serious. Any type of abnormal bleeding should be evaluated by your health care provider. Treatment will depend on the cause of the bleeding. HOME CARE INSTRUCTIONS Monitor your condition for any changes. The following actions may help to alleviate any discomfort you are experiencing:  Avoid the use of tampons and douches as directed by your health care provider.  Change your pads frequently. You should get regular pelvic exams and Pap tests. Keep all follow-up appointments for diagnostic tests as directed by your health care provider.  SEEK MEDICAL CARE IF:   Your bleeding lasts more than 1 week.   You feel dizzy at times.  SEEK IMMEDIATE MEDICAL CARE IF:   You pass out.   You are changing pads every 15 to 30 minutes.   You have abdominal pain.  You have a fever.   You become sweaty or weak.   You are passing large blood clots from the vagina.   You start to feel nauseous and vomit. MAKE SURE YOU:   Understand these instructions.  Will watch your condition.  Will get help right away if you are not doing well or get worse. Document Released: 01/14/2005 Document Revised: 01/19/2013 Document Reviewed: 08/13/2012 [employer] Patient Information 2015 [employer]. This information is not intended to replace advice given to you by your health care provider. Make sure you discuss any questions you have with your  health care provider.  

## 2013-09-24 NOTE — MAU Note (Signed)
PT SAYS  SHE STARTED  CYCLE ON 8-6- WAS LIGHT   THEN   BECAME HEAVY  AND  HEAVY SINCE.  [place] Kitchen  IN RM-   SPOTS OF RED BLEEDING.      DENIES ABD CRAMPS.  BUT HAS  PAIN ACROSS LOWER BACK- AND HAS THIS BEFORE.     TOOK 0700- ADVIL-   WITH RELIEF.     LAST SEX-  8-1.       BTL-  2003.   NO GYN DR.  .    OTHER CYCLES ALL NL.

## 2013-09-24 NOTE — MAU Provider Note (Signed)
Chief Complaint: Vaginal Bleeding   First Provider Initiated Contact with Patient 09/24/13 2034     SUBJECTIVE HPI: [name] is a 43 y.o. [id number] who presents to maternity admissions reporting vaginal bleeding off and on x 3 weeks.  Patient's last menstrual period was 09/02/2013.  Her menses started at the regular time, but continued and has not stopped.  She has never had bleeding outside of her regular menses before. She denies abdominal pain but reports low back pain x 3 months.  She also reports fatigue more than usual, especially at work.  She denies vaginal itching/burning, urinary symptoms, h/a, dizziness, n/v, or fever/chills.     Past Medical History  Diagnosis Date  . Diabetes mellitus without complication   . Asthma   . Thyroid disease    Past Surgical History  Procedure Laterality Date  . Tubal ligation    . Cholecystectomy    . Cesarean section     History   Social History  . Marital Status: Married    Spouse Name: N/A    Number of Children: N/A  . Years of Education: N/A   Occupational History  . Not on file.   Social History Main Topics  . Smoking status: Never Smoker   . Smokeless tobacco: Not on file  . Alcohol Use: No  . Drug Use: No  . Sexual Activity: Not on file   Other Topics Concern  . Not on file   Social History Narrative  . No narrative on file   No current facility-administered medications on file prior to encounter.   Current Outpatient Prescriptions on File Prior to Encounter  Medication Sig Dispense Refill  . acetaminophen (TYLENOL) 500 MG tablet Take 1,000 mg by mouth every 6 (six) hours as needed for moderate pain.      . clindamycin (CLEOCIN) 150 MG capsule Take 1 capsule (150 mg total) by mouth every 6 (six) hours.  28 capsule  0  . meloxicam (MOBIC) 7.5 MG tablet Take 2 tablets (15 mg total) by mouth daily.  30 tablet  0  . albuterol (PROVENTIL HFA;VENTOLIN HFA) 108 (90 BASE) MCG/ACT inhaler Inhale 2 puffs into the lungs every 6  (six) hours as needed for wheezing or shortness of breath.       Allergies  Allergen Reactions  . Bactrim [Sulfamethoxazole-Trimethoprim] Hives  . Penicillins Other (See Comments)    "when I pee it hurts"    ROS: Pertinent items in HPI  OBJECTIVE Blood pressure 153/88, pulse 82, temperature 98.4 F (36.9 C), resp. rate 18, height 5\' 2"  (1.575 m), weight 97.16 kg (214 lb 3.2 oz), last menstrual period 09/02/2013, SpO2 100.00%. GENERAL: Well-developed, well-nourished female in no acute distress.  HEENT: Normocephalic HEART: normal rate RESP: normal effort ABDOMEN: Soft, non-tender EXTREMITIES: Nontender, no edema NEURO: Alert and oriented Pelvic exam: Cervix pink, visually closed, without lesion, small amount dark red blood, vaginal walls and external genitalia normal Bimanual exam: Cervix 0/long/high, firm, anterior, neg CMT, uterus nontender, nonenlarged, adnexa without tenderness, enlargement, or mass  LAB RESULTS Results for orders placed during the hospital encounter of 09/24/13 (from the past 24 hour(s))  URINALYSIS, ROUTINE W REFLEX MICROSCOPIC     Status: Abnormal   Collection Time    09/24/13  7:50 PM      Result Value Ref Range   Color, Urine RED (*) YELLOW   APPearance CLEAR  CLEAR   Specific Gravity, Urine 1.015  1.005 - 1.030   pH 7.0  5.0 - 8.0  Glucose, UA NEGATIVE  NEGATIVE mg/dL   Hgb urine dipstick LARGE (*) NEGATIVE   Bilirubin Urine NEGATIVE  NEGATIVE   Ketones, ur NEGATIVE  NEGATIVE mg/dL   Protein, ur NEGATIVE  NEGATIVE mg/dL   Urobilinogen, UA 1.0  0.0 - 1.0 mg/dL   Nitrite NEGATIVE  NEGATIVE   Leukocytes, UA TRACE (*) NEGATIVE  URINE MICROSCOPIC-ADD ON     Status: Abnormal   Collection Time    09/24/13  7:50 PM      Result Value Ref Range   Squamous Epithelial / LPF FEW (*) RARE   WBC, UA 3-6  <3 WBC/hpf   RBC / HPF TOO NUMEROUS TO COUNT  <3 RBC/hpf   Bacteria, UA FEW (*) RARE  POCT PREGNANCY, URINE     Status: None   Collection Time     09/24/13  8:20 PM      Result Value Ref Range   Preg Test, Ur NEGATIVE  NEGATIVE  CBC     Status: Abnormal   Collection Time    09/24/13  8:35 PM      Result Value Ref Range   WBC 8.2  4.0 - 10.5 K/uL   RBC 3.36 (*) 3.87 - 5.11 MIL/uL   Hemoglobin 10.2 (*) 12.0 - 15.0 g/dL   HCT 30.4 (*) 36.0 - 46.0 %   MCV 90.5  78.0 - 100.0 fL   MCH 30.4  26.0 - 34.0 pg   MCHC 33.6  30.0 - 36.0 g/dL   RDW 13.5  11.5 - 15.5 %   Platelets 295  150 - 400 K/uL  WET PREP, GENITAL     Status: Abnormal   Collection Time    09/24/13  8:42 PM      Result Value Ref Range   Yeast Wet Prep HPF POC NONE SEEN  NONE SEEN   Trich, Wet Prep NONE SEEN  NONE SEEN   Clue Cells Wet Prep HPF POC FEW (*) NONE SEEN   WBC, Wet Prep HPF POC FEW (*) NONE SEEN     ASSESSMENT 1. Abnormal uterine bleeding (AUB)   2. Bilateral low back pain without sciatica   3. Anemia, iron deficiency     PLAN Discharge home Megace taper  Iron supplement and colace daily F/U in [place] in 2 months if symptoms persist    Medication List         acetaminophen 500 MG tablet  Commonly known as:  TYLENOL  Take 1,000 mg by mouth every 6 (six) hours as needed for moderate pain.     albuterol 108 (90 BASE) MCG/ACT inhaler  Commonly known as:  PROVENTIL HFA;VENTOLIN HFA  Inhale 2 puffs into the lungs every 6 (six) hours as needed for wheezing or shortness of breath.     clindamycin 150 MG capsule  Commonly known as:  CLEOCIN  Take 1 capsule (150 mg total) by mouth every 6 (six) hours.     docusate sodium 100 MG capsule  Commonly known as:  COLACE  Take 1 capsule (100 mg total) by mouth 2 (two) times daily as needed.     ferrous sulfate 325 (65 FE) MG tablet  Commonly known as:  FERROUSUL  Take 1 tablet (325 mg total) by mouth 2 (two) times daily.     megestrol 40 MG tablet  Commonly known as:  MEGACE  Take 1 tablet 3 times/day for 3 days, then take 2 tablets twice/day for 3 days, then take 1 tablet once per day.  meloxicam 7.5 MG tablet  Commonly known as:  MOBIC  Take 2 tablets (15 mg total) by mouth daily.       Follow-up Information   Follow up with [facility].   Specialty:  Obstetrics and Gynecology   Contact information:   [place] [address] [place] Certified Nurse-Midwife 09/24/2013  9:22 PM

## 2013-09-24 NOTE — MAU Note (Signed)
Bleeding for 3.5wks. Started out light and then got heavy and now moderate. LMP 8/6. Occ. Have some pains across lower back.

## 2013-09-25 LAB — GC/CHLAMYDIA PROBE AMP
CT PROBE, AMP APTIMA: NEGATIVE
GC PROBE AMP APTIMA: NEGATIVE

## 2013-09-28 ENCOUNTER — Encounter: Payer: Self-pay | Admitting: Obstetrics & Gynecology

## 2013-10-20 ENCOUNTER — Emergency Department (HOSPITAL_COMMUNITY): Payer: BC Managed Care – PPO

## 2013-10-20 ENCOUNTER — Emergency Department (HOSPITAL_COMMUNITY)
Admission: EM | Admit: 2013-10-20 | Discharge: 2013-10-20 | Disposition: A | Discharge status: Home / Self Care | Payer: BC Managed Care – PPO | Attending: Emergency Medicine | Admitting: Emergency Medicine

## 2013-10-20 ENCOUNTER — Encounter (HOSPITAL_COMMUNITY): Payer: Self-pay | Admitting: Emergency Medicine

## 2013-10-20 DIAGNOSIS — Z79899 Other long term (current) drug therapy: Secondary | ICD-10-CM | POA: Insufficient documentation

## 2013-10-20 DIAGNOSIS — R509 Fever, unspecified: Secondary | ICD-10-CM | POA: Insufficient documentation

## 2013-10-20 DIAGNOSIS — J40 Bronchitis, not specified as acute or chronic: Secondary | ICD-10-CM

## 2013-10-20 DIAGNOSIS — J45909 Unspecified asthma, uncomplicated: Secondary | ICD-10-CM | POA: Diagnosis not present

## 2013-10-20 DIAGNOSIS — M549 Dorsalgia, unspecified: Secondary | ICD-10-CM | POA: Insufficient documentation

## 2013-10-20 DIAGNOSIS — Z880 Allergy status to penicillin: Secondary | ICD-10-CM | POA: Insufficient documentation

## 2013-10-20 DIAGNOSIS — E119 Type 2 diabetes mellitus without complications: Secondary | ICD-10-CM | POA: Diagnosis not present

## 2013-10-20 DIAGNOSIS — Z791 Long term (current) use of non-steroidal anti-inflammatories (NSAID): Secondary | ICD-10-CM | POA: Diagnosis not present

## 2013-10-20 MED ORDER — PREDNISONE 20 MG PO TABS
60.0000 mg | ORAL_TABLET | Freq: Once | ORAL | Status: AC
  Administered 2013-10-20: 60 mg via ORAL
  Filled 2013-10-20: qty 3

## 2013-10-20 MED ORDER — ALBUTEROL SULFATE HFA 108 (90 BASE) MCG/ACT IN AERS
2.0000 | INHALATION_SPRAY | Freq: Once | RESPIRATORY_TRACT | Status: AC
  Administered 2013-10-20: 2 via RESPIRATORY_TRACT
  Filled 2013-10-20: qty 6.7

## 2013-10-20 MED ORDER — HYDROCOD POLST-CHLORPHEN POLST 10-8 MG/5ML PO LQCR: 5.0000 mL | Freq: Two times a day (BID) | ORAL | Status: DC | PRN

## 2013-10-20 MED ORDER — AZITHROMYCIN 250 MG PO TABS: 250.0000 mg | ORAL_TABLET | Freq: Every day | ORAL | Status: DC

## 2013-10-20 MED ORDER — PREDNISONE 10 MG PO TABS: ORAL_TABLET | ORAL | Status: DC

## 2013-10-20 NOTE — ED Notes (Signed)
Pt states she has had URI symptoms x 3 weeks that has gradually worsened. States it hurts to cough in her back. Alert and oriented. Non productive cough. Nasal drip.

## 2013-10-20 NOTE — ED Provider Notes (Signed)
CSN: [id number]     Arrival date & time 10/20/13  1924 History  This chart was scribed for non-physician practitioner working with Janice Norrie, MD, by Jeanell Sparrow, ED Scribe. This patient was seen in room [place] and the patient's care was started at 8:38 PM.    Chief Complaint  Patient presents with  . URI  . Back Pain   The history is provided by the patient. No language interpreter was used.   HPI Comments: [name] is a 43 y.o. female with a hx of asthma who presents to the Emergency Department complaining of a URI that started about 3 weeks ago. She states that she has fever, congestion, and sore throat. Pt's temperature in the ED today is 98.3. She reports that she has back pain present whenever she coughs. She states that she took tylenol today for the pain. She denies any hx of seasonal allergies. No SOB.   Past Medical History  Diagnosis Date  . Diabetes mellitus without complication   . Asthma   . Thyroid disease    Past Surgical History  Procedure Laterality Date  . Tubal ligation    . Cholecystectomy    . Cesarean section     Family History  Problem Relation Age of Onset  . Diabetes Mother   . Hypertension Mother   . Thyroid disease Mother    History  Substance Use Topics  . Smoking status: Never Smoker   . Smokeless tobacco: Not on file  . Alcohol Use: No   OB History   Grav Para Term Preterm Abortions TAB SAB Ect Mult Living   6 4 3 1 2  2   3      Review of Systems  Constitutional: Positive for fever.  HENT: Positive for congestion and sore throat.   Respiratory: Positive for cough.   Musculoskeletal: Positive for back pain.  All other systems reviewed and are negative.  Allergies  Bactrim and Penicillins  Home Medications   Prior to Admission medications   Medication Sig Start Date End Date Taking? Authorizing Provider  acetaminophen (TYLENOL) 500 MG tablet Take 1,000 mg by mouth every 6 (six) hours as needed for moderate pain.   Yes  Historical Provider, MD  albuterol (PROVENTIL HFA;VENTOLIN HFA) 108 (90 BASE) MCG/ACT inhaler Inhale 2 puffs into the lungs every 6 (six) hours as needed for wheezing or shortness of breath.   Yes Historical Provider, MD  docusate sodium (COLACE) 100 MG capsule Take 1 capsule (100 mg total) by mouth 2 (two) times daily as needed. 09/24/13  Yes Lisa A Leftwich-Kirby, CNM  ferrous sulfate (FERROUSUL) 325 (65 FE) MG tablet Take 1 tablet (325 mg total) by mouth 2 (two) times daily. 09/24/13  Yes Lisa A Leftwich-Kirby, CNM  meloxicam (MOBIC) 7.5 MG tablet Take 2 tablets (15 mg total) by mouth daily. 08/30/13  Yes Noland Fordyce, PA-C   BP 126/81  Pulse 96  Temp(Src) 98.3 F (36.8 C) (Oral)  SpO2 100%  LMP 10/18/2013 Physical Exam  Nursing note and vitals reviewed. Constitutional: She is oriented to person, place, and time. She appears well-developed and well-nourished. No distress.  HENT:  Head: Normocephalic.  Right Ear: External ear normal.  Left Ear: External ear normal.  Nose: Nose normal.  Mouth/Throat: Oropharynx is clear and moist.  Eyes: Conjunctivae are normal.  Neck: Normal range of motion. Neck supple.  Cardiovascular: Normal rate and normal heart sounds.   Pulmonary/Chest: Effort normal and breath sounds normal. No respiratory distress. She  has no wheezes. She has no rales.  Neurological: She is alert and oriented to person, place, and time.  Skin: Skin is warm and dry.    ED Course  Procedures (including critical care time) DIAGNOSTIC STUDIES: Oxygen Saturation is 100% on RA, normal by my interpretation.    COORDINATION OF CARE: 8:42 PM- Pt advised of plan for treatment which includes medication, and radiology and pt agrees.  Labs Review Labs Reviewed - No data to display  Imaging Review Dg Chest 2 View (if Patient Has Fever And/or Copd)  10/20/2013   CLINICAL DATA:  Cough, fever and congestion.  EXAM: CHEST  2 VIEW  COMPARISON:  04/25/2012.  FINDINGS: The cardiac  silhouette, mediastinal and hilar contours are normal and stable. The lungs are clear. No pleural effusion. The bony thorax is intact.  IMPRESSION: No acute cardiopulmonary findings.   Electronically Signed   By: Kalman Jewels M.D.   On: 10/20/2013 20:40     EKG Interpretation None      MDM   Final diagnoses:  Bronchitis    Patient with cough for 3 weeks, reports prior fever, congestion, sore throat. Afebrile now. Lungs are clear on exam. Patient reports history of asthma. States she ran out of her inhaler. Chest x-ray obtained and is negative. Her concern for PE or coronary artery disease. Will treat with inhaler, prednisone, Z-Pak, Tussionex. VS normal. Follow with primary care Dr.  Danley Danker Vitals:   10/20/13 1932  BP: 126/81  Pulse: 96  Temp: 98.3 F (36.8 C)  TempSrc: Oral  SpO2: 100%   I personally performed the services described in this documentation, which was scribed in my presence. The recorded information has been reviewed and is accurate.   Renold Genta, PA-C 10/20/13 2055

## 2013-10-20 NOTE — ED Provider Notes (Signed)
Medical screening examination/treatment/procedure(s) were performed by non-physician practitioner and as supervising physician I was immediately available for consultation/collaboration.   EKG Interpretation None      Rolland Porter, MD, Abram Sander   Janice Norrie, MD 10/20/13 [phone]

## 2013-10-20 NOTE — Discharge Instructions (Signed)
Your chest x-ray is normal. Take zithromax as prescribed until all gone. Prednisone until all gone. tussionex as needed for pain and cough. Try heating pads for back pain. Follow up with your doctor.   Acute Bronchitis Bronchitis is inflammation of the airways that extend from the windpipe into the lungs (bronchi). The inflammation often causes mucus to develop. This leads to a cough, which is the most common symptom of bronchitis.  In acute bronchitis, the condition usually develops suddenly and goes away over time, usually in a couple weeks. Smoking, allergies, and asthma can make bronchitis worse. Repeated episodes of bronchitis may cause further lung problems.  CAUSES Acute bronchitis is most often caused by the same virus that causes a cold. The virus can spread from person to person (contagious) through coughing, sneezing, and touching contaminated objects. SIGNS AND SYMPTOMS   Cough.   Fever.   Coughing up mucus.   Body aches.   Chest congestion.   Chills.   Shortness of breath.   Sore throat.  DIAGNOSIS  Acute bronchitis is usually diagnosed through a physical exam. Your health care provider will also ask you questions about your medical history. Tests, such as chest X-rays, are sometimes done to rule out other conditions.  TREATMENT  Acute bronchitis usually goes away in a couple weeks. Oftentimes, no medical treatment is necessary. Medicines are sometimes given for relief of fever or cough. Antibiotic medicines are usually not needed but may be prescribed in certain situations. In some cases, an inhaler may be recommended to help reduce shortness of breath and control the cough. A cool mist vaporizer may also be used to help thin bronchial secretions and make it easier to clear the chest.  HOME CARE INSTRUCTIONS  Get plenty of rest.   Drink enough fluids to keep your urine clear or pale yellow (unless you have a medical condition that requires fluid restriction).  Increasing fluids may help thin your respiratory secretions (sputum) and reduce chest congestion, and it will prevent dehydration.   Take medicines only as directed by your health care provider.  If you were prescribed an antibiotic medicine, finish it all even if you start to feel better.  Avoid smoking and secondhand smoke. Exposure to cigarette smoke or irritating chemicals will make bronchitis worse. If you are a smoker, consider using nicotine gum or skin patches to help control withdrawal symptoms. Quitting smoking will help your lungs heal faster.   Reduce the chances of another bout of acute bronchitis by washing your hands frequently, avoiding people with cold symptoms, and trying not to touch your hands to your mouth, nose, or eyes.   Keep all follow-up visits as directed by your health care provider.  SEEK MEDICAL CARE IF: Your symptoms do not improve after 1 week of treatment.  SEEK IMMEDIATE MEDICAL CARE IF:  You develop an increased fever or chills.   You have chest pain.   You have severe shortness of breath.  You have bloody sputum.   You develop dehydration.  You faint or repeatedly feel like you are going to pass out.  You develop repeated vomiting.  You develop a severe headache. MAKE SURE YOU:   Understand these instructions.  Will watch your condition.  Will get help right away if you are not doing well or get worse. Document Released: 02/22/2004 Document Revised: 05/31/2013 Document Reviewed: 07/07/2012 [facility] Patient Information 2015 [place], [place]. This information is not intended to replace advice given to you by your health care provider. Make  sure you discuss any questions you have with your health care provider.

## 2013-11-22 ENCOUNTER — Encounter: Payer: Self-pay | Admitting: General Practice

## 2013-11-22 ENCOUNTER — Encounter: Payer: BC Managed Care – PPO | Admitting: Obstetrics & Gynecology

## 2013-11-22 ENCOUNTER — Telehealth: Payer: Self-pay | Admitting: General Practice

## 2013-11-22 NOTE — Telephone Encounter (Signed)
Patient no showed for appt today. Called patient and message stated this person does not accept incoming calls. Will send letter.

## 2013-11-29 ENCOUNTER — Encounter (HOSPITAL_COMMUNITY): Payer: Self-pay | Admitting: Emergency Medicine

## 2013-12-25 ENCOUNTER — Emergency Department (HOSPITAL_COMMUNITY)
Admission: EM | Admit: 2013-12-25 | Discharge: 2013-12-25 | Disposition: A | Discharge status: Home / Self Care | Payer: BC Managed Care – PPO | Attending: Emergency Medicine | Admitting: Emergency Medicine

## 2013-12-25 ENCOUNTER — Encounter (HOSPITAL_COMMUNITY): Payer: Self-pay | Admitting: *Deleted

## 2013-12-25 ENCOUNTER — Emergency Department (HOSPITAL_COMMUNITY): Payer: BC Managed Care – PPO

## 2013-12-25 DIAGNOSIS — R109 Unspecified abdominal pain: Secondary | ICD-10-CM

## 2013-12-25 DIAGNOSIS — Z9889 Other specified postprocedural states: Secondary | ICD-10-CM | POA: Diagnosis not present

## 2013-12-25 DIAGNOSIS — Z79899 Other long term (current) drug therapy: Secondary | ICD-10-CM | POA: Diagnosis not present

## 2013-12-25 DIAGNOSIS — Z880 Allergy status to penicillin: Secondary | ICD-10-CM | POA: Diagnosis not present

## 2013-12-25 DIAGNOSIS — E119 Type 2 diabetes mellitus without complications: Secondary | ICD-10-CM | POA: Insufficient documentation

## 2013-12-25 DIAGNOSIS — J45909 Unspecified asthma, uncomplicated: Secondary | ICD-10-CM | POA: Insufficient documentation

## 2013-12-25 DIAGNOSIS — Z9049 Acquired absence of other specified parts of digestive tract: Secondary | ICD-10-CM | POA: Insufficient documentation

## 2013-12-25 DIAGNOSIS — Z9851 Tubal ligation status: Secondary | ICD-10-CM | POA: Insufficient documentation

## 2013-12-25 DIAGNOSIS — R1032 Left lower quadrant pain: Secondary | ICD-10-CM | POA: Diagnosis present

## 2013-12-25 DIAGNOSIS — R319 Hematuria, unspecified: Secondary | ICD-10-CM

## 2013-12-25 LAB — COMPREHENSIVE METABOLIC PANEL
ALT: 12 U/L (ref 0–35)
AST: 17 U/L (ref 0–37)
Albumin: 3.4 g/dL — ABNORMAL LOW (ref 3.5–5.2)
Alkaline Phosphatase: 79 U/L (ref 39–117)
Anion gap: 12 (ref 5–15)
BUN: 12 mg/dL (ref 6–23)
CALCIUM: 8.8 mg/dL (ref 8.4–10.5)
CO2: 24 mEq/L (ref 19–32)
Chloride: 102 mEq/L (ref 96–112)
Creatinine, Ser: 0.62 mg/dL (ref 0.50–1.10)
GFR calc non Af Amer: >90 mL/min (ref >90)
Glucose, Bld: 155 mg/dL — ABNORMAL HIGH (ref 70–99)
Potassium: 4.3 mEq/L (ref 3.7–5.3)
Sodium: 138 mEq/L (ref 137–147)
TOTAL PROTEIN: 7.7 g/dL (ref 6.0–8.3)
Total Bilirubin: <0.2 mg/dL — ABNORMAL LOW (ref 0.3–1.2)

## 2013-12-25 LAB — CBC WITH DIFFERENTIAL/PLATELET
BASOS ABS: 0 10*3/uL (ref 0.0–0.1)
Basophils Relative: 0 % (ref 0–1)
EOS ABS: 0.2 10*3/uL (ref 0.0–0.7)
EOS PCT: 3 % (ref 0–5)
HCT: 34.7 % — ABNORMAL LOW (ref 36.0–46.0)
Hemoglobin: 11.2 g/dL — ABNORMAL LOW (ref 12.0–15.0)
Lymphocytes Relative: 39 % (ref 12–46)
Lymphs Abs: 2.8 10*3/uL (ref 0.7–4.0)
MCH: 28.1 pg (ref 26.0–34.0)
MCHC: 32.3 g/dL (ref 30.0–36.0)
MCV: 87.2 fL (ref 78.0–100.0)
Monocytes Absolute: 0.4 10*3/uL (ref 0.1–1.0)
Monocytes Relative: 6 % (ref 3–12)
Neutro Abs: 3.8 10*3/uL (ref 1.7–7.7)
Neutrophils Relative %: 52 % (ref 43–77)
Platelets: 365 10*3/uL (ref 150–400)
RBC: 3.98 MIL/uL (ref 3.87–5.11)
RDW: 13.8 % (ref 11.5–15.5)
WBC: 7.2 10*3/uL (ref 4.0–10.5)

## 2013-12-25 LAB — URINALYSIS, ROUTINE W REFLEX MICROSCOPIC
Bilirubin Urine: NEGATIVE
Glucose, UA: NEGATIVE mg/dL
KETONES UR: NEGATIVE mg/dL
LEUKOCYTES UA: NEGATIVE
NITRITE: NEGATIVE
Protein, ur: NEGATIVE mg/dL
Specific Gravity, Urine: 1.02 (ref 1.005–1.030)
UROBILINOGEN UA: 0.2 mg/dL (ref 0.0–1.0)
pH: 5.5 (ref 5.0–8.0)

## 2013-12-25 LAB — URINE MICROSCOPIC-ADD ON

## 2013-12-25 LAB — LIPASE, BLOOD: Lipase: 30 U/L (ref 11–59)

## 2013-12-25 LAB — TROPONIN I

## 2013-12-25 MED ORDER — HYDROCODONE-ACETAMINOPHEN 5-325 MG PO TABS: 1.0000 | ORAL_TABLET | ORAL | Status: DC | PRN

## 2013-12-25 NOTE — ED Notes (Signed)
Pt reports left lower abdominal pain and "pain all across my back" Denies urinary sx, n/v/d, fever.

## 2013-12-25 NOTE — Discharge Instructions (Signed)

## 2013-12-25 NOTE — ED Provider Notes (Signed)
CSN: [id number]     Arrival date & time 12/25/13  1205 History   First MD Initiated Contact with Patient 12/25/13 1255     Chief Complaint  Patient presents with  . Abdominal Pain  . Back Pain     (Consider location/radiation/quality/duration/timing/severity/associated sxs/prior Treatment) HPI Patient reports left-sided flank pain as well as intermittent left lower abdominal pain over the past 2 weeks.  No urinary frequency.  Denies dysuria.  No fevers or chills.  Denies nausea at this time reports from the pain comes on she becomes nauseated.  No vomiting.  No diarrhea.  No blood in her stool.  Symptoms are mild to moderate in severity.   Past Medical History  Diagnosis Date  . Diabetes mellitus without complication   . Asthma   . Thyroid disease    Past Surgical History  Procedure Laterality Date  . Tubal ligation    . Cholecystectomy    . Cesarean section     Family History  Problem Relation Age of Onset  . Diabetes Mother   . Hypertension Mother   . Thyroid disease Mother    History  Substance Use Topics  . Smoking status: Never Smoker   . Smokeless tobacco: Not on file  . Alcohol Use: No   OB History    Gravida Para Term Preterm AB TAB SAB Ectopic Multiple Living   6 4 3 1 2  2   3      Review of Systems  All other systems reviewed and are negative.     Allergies  Bactrim and Penicillins  Home Medications   Prior to Admission medications   Medication Sig Start Date End Date Taking? Authorizing Provider  acetaminophen (TYLENOL) 500 MG tablet Take 1,000 mg by mouth every 6 (six) hours as needed for moderate pain.   Yes Historical Provider, MD  albuterol (PROVENTIL HFA;VENTOLIN HFA) 108 (90 BASE) MCG/ACT inhaler Inhale 2 puffs into the lungs every 6 (six) hours as needed for wheezing or shortness of breath.   Yes Historical Provider, MD  azithromycin (ZITHROMAX) 250 MG tablet Take 1 tablet (250 mg total) by mouth daily. Take first 2 tablets together, then  1 every day until finished. Patient not taking: Reported on 12/25/2013 10/20/13   Tatyana A Kirichenko, PA-C  chlorpheniramine-HYDROcodone (TUSSIONEX PENNKINETIC ER) 10-8 MG/5ML LQCR Take 5 mLs by mouth every 12 (twelve) hours as needed for cough. Patient not taking: Reported on 12/25/2013 10/20/13   Tatyana A Kirichenko, PA-C  docusate sodium (COLACE) 100 MG capsule Take 1 capsule (100 mg total) by mouth 2 (two) times daily as needed. Patient not taking: Reported on 12/25/2013 09/24/13   Kathie Dike Leftwich-Kirby, CNM  ferrous sulfate (FERROUSUL) 325 (65 FE) MG tablet Take 1 tablet (325 mg total) by mouth 2 (two) times daily. Patient not taking: Reported on 12/25/2013 09/24/13   Kathie Dike Leftwich-Kirby, CNM  HYDROcodone-acetaminophen (NORCO/VICODIN) 5-325 MG per tablet Take 1 tablet by mouth every 4 (four) hours as needed for moderate pain. 12/25/13   Hoy Morn, MD  meloxicam (MOBIC) 7.5 MG tablet Take 2 tablets (15 mg total) by mouth daily. Patient not taking: Reported on 12/25/2013 08/30/13   Noland Fordyce, PA-C  predniSONE (DELTASONE) 10 MG tablet Take 5 tab day 1, take 4 tab day 2, take 3 tab day 3, take 2 tab day 4, and take 1 tab day 5 Patient not taking: Reported on 12/25/2013 10/20/13   Tatyana A Kirichenko, PA-C   BP 142/84 mmHg  Pulse  74  Temp(Src) 98 F (36.7 C) (Oral)  Resp 16  SpO2 100%  LMP 12/12/2013 Physical Exam  Constitutional: She is oriented to person, place, and time. She appears well-developed and well-nourished. No distress.  HENT:  Head: Normocephalic and atraumatic.  Eyes: EOM are normal.  Neck: Normal range of motion.  Cardiovascular: Normal rate, regular rhythm and normal heart sounds.   Pulmonary/Chest: Effort normal and breath sounds normal.  Abdominal: Soft. She exhibits no distension.  Mild left lower quadrant tenderness without guarding or rebound.  Musculoskeletal: Normal range of motion.  Neurological: She is alert and oriented to person, place, and time.   Skin: Skin is warm and dry.  Psychiatric: She has a normal mood and affect. Judgment normal.  Nursing note and vitals reviewed.   ED Course  Procedures (including critical care time) Labs Review Labs Reviewed  CBC WITH DIFFERENTIAL - Abnormal; Notable for the following:    Hemoglobin 11.2 (*)    HCT 34.7 (*)    All other components within normal limits  COMPREHENSIVE METABOLIC PANEL - Abnormal; Notable for the following:    Glucose, Bld 155 (*)    Albumin 3.4 (*)    Total Bilirubin <0.2 (*)    All other components within normal limits  URINALYSIS, ROUTINE W REFLEX MICROSCOPIC - Abnormal; Notable for the following:    Hgb urine dipstick MODERATE (*)    All other components within normal limits  LIPASE, BLOOD  TROPONIN I  URINE MICROSCOPIC-ADD ON    Imaging Review Ct Abdomen Pelvis Wo Contrast  12/25/2013   CLINICAL DATA:  Acute left lower quadrant abdominal pain.  EXAM: CT ABDOMEN AND PELVIS WITHOUT CONTRAST  TECHNIQUE: Multidetector CT imaging of the abdomen and pelvis was performed following the standard protocol without IV contrast.  COMPARISON:  CT scan of October 13, 2010.  FINDINGS: Visualized lung bases appear normal. No significant osseous abnormality is noted.  Status post cholecystectomy. Fatty infiltration of the liver is noted with mild hepatomegaly. The spleen and pancreas appear normal. Adrenal glands appear normal. Small nonobstructive calculus is noted in the upper pole collecting system of the left kidney. No hydronephrosis is noted. No definite ureteral calculi are noted. Stool is noted throughout the colon. There is no evidence of bowel obstruction. The appendix appears normal. No abnormal fluid collection is noted. Urinary bladder and uterus appear normal. Ovaries appear normal. No significant adenopathy is noted.  IMPRESSION: Mild hepatomegaly and fatty infiltration of hepatic parenchyma is noted. Status post cholecystectomy. Small nonobstructive left renal  calculus. No hydronephrosis or renal obstruction is noted.   Electronically Signed   By: Sabino Dick M.D.   On: 12/25/2013 14:35  I personally reviewed the imaging tests through PACS system I reviewed available ER/hospitalization records through the EMR    EKG Interpretation None      MDM   Final diagnoses:  Abdominal pain  Hematuria    Patient is feeling much better at this time.  Discharge home in good condition.    Hoy Morn, MD 12/25/13 [phone]

## 2013-12-29 ENCOUNTER — Emergency Department (HOSPITAL_COMMUNITY): Payer: BC Managed Care – PPO

## 2013-12-29 ENCOUNTER — Emergency Department (HOSPITAL_COMMUNITY)
Admission: EM | Admit: 2013-12-29 | Discharge: 2013-12-29 | Disposition: A | Discharge status: Home / Self Care | Payer: BC Managed Care – PPO | Attending: Emergency Medicine | Admitting: Emergency Medicine

## 2013-12-29 ENCOUNTER — Encounter (HOSPITAL_COMMUNITY): Payer: Self-pay | Admitting: *Deleted

## 2013-12-29 DIAGNOSIS — R300 Dysuria: Secondary | ICD-10-CM | POA: Diagnosis present

## 2013-12-29 DIAGNOSIS — N390 Urinary tract infection, site not specified: Secondary | ICD-10-CM | POA: Diagnosis not present

## 2013-12-29 DIAGNOSIS — G8929 Other chronic pain: Secondary | ICD-10-CM | POA: Insufficient documentation

## 2013-12-29 DIAGNOSIS — Z3202 Encounter for pregnancy test, result negative: Secondary | ICD-10-CM | POA: Insufficient documentation

## 2013-12-29 DIAGNOSIS — E119 Type 2 diabetes mellitus without complications: Secondary | ICD-10-CM | POA: Diagnosis not present

## 2013-12-29 DIAGNOSIS — Z792 Long term (current) use of antibiotics: Secondary | ICD-10-CM | POA: Insufficient documentation

## 2013-12-29 DIAGNOSIS — Z79899 Other long term (current) drug therapy: Secondary | ICD-10-CM | POA: Diagnosis not present

## 2013-12-29 DIAGNOSIS — M5430 Sciatica, unspecified side: Secondary | ICD-10-CM | POA: Insufficient documentation

## 2013-12-29 DIAGNOSIS — Z791 Long term (current) use of non-steroidal anti-inflammatories (NSAID): Secondary | ICD-10-CM | POA: Insufficient documentation

## 2013-12-29 DIAGNOSIS — J45909 Unspecified asthma, uncomplicated: Secondary | ICD-10-CM | POA: Insufficient documentation

## 2013-12-29 DIAGNOSIS — Z880 Allergy status to penicillin: Secondary | ICD-10-CM | POA: Diagnosis not present

## 2013-12-29 DIAGNOSIS — R102 Pelvic and perineal pain: Secondary | ICD-10-CM

## 2013-12-29 HISTORY — DX: Headache, unspecified: R51.9

## 2013-12-29 HISTORY — DX: Headache: R51

## 2013-12-29 HISTORY — DX: Dorsalgia, unspecified: M54.9

## 2013-12-29 HISTORY — DX: Sciatica, unspecified side: M54.30

## 2013-12-29 HISTORY — DX: Other chronic pain: G89.29

## 2013-12-29 HISTORY — DX: Cervicalgia: M54.2

## 2013-12-29 LAB — URINE MICROSCOPIC-ADD ON

## 2013-12-29 LAB — URINALYSIS, ROUTINE W REFLEX MICROSCOPIC
Bilirubin Urine: NEGATIVE
Glucose, UA: NEGATIVE mg/dL
Ketones, ur: NEGATIVE mg/dL
NITRITE: NEGATIVE
PROTEIN: 100 mg/dL — AB
SPECIFIC GRAVITY, URINE: 1.011 (ref 1.005–1.030)
UROBILINOGEN UA: 0.2 mg/dL (ref 0.0–1.0)
pH: 6 (ref 5.0–8.0)

## 2013-12-29 LAB — WET PREP, GENITAL
Clue Cells Wet Prep HPF POC: NONE SEEN
Trich, Wet Prep: NONE SEEN
YEAST WET PREP: NONE SEEN

## 2013-12-29 LAB — PREGNANCY, URINE: PREG TEST UR: NEGATIVE

## 2013-12-29 LAB — GC/CHLAMYDIA PROBE AMP
CT Probe RNA: NEGATIVE
GC Probe RNA: NEGATIVE

## 2013-12-29 MED ORDER — CEPHALEXIN 500 MG PO CAPS: 500.0000 mg | ORAL_CAPSULE | Freq: Four times a day (QID) | ORAL | Status: DC

## 2013-12-29 MED ORDER — KETOROLAC TROMETHAMINE 60 MG/2ML IM SOLN
60.0000 mg | Freq: Once | INTRAMUSCULAR | Status: AC
  Administered 2013-12-29: 60 mg via INTRAMUSCULAR
  Filled 2013-12-29: qty 2

## 2013-12-29 MED ORDER — ACETAMINOPHEN 500 MG PO TABS
1000.0000 mg | ORAL_TABLET | Freq: Once | ORAL | Status: AC
  Administered 2013-12-29: 1000 mg via ORAL
  Filled 2013-12-29: qty 2

## 2013-12-29 MED ORDER — NAPROXEN 250 MG PO TABS: 250.0000 mg | ORAL_TABLET | Freq: Two times a day (BID) | ORAL | Status: DC | PRN

## 2013-12-29 NOTE — ED Notes (Signed)
Patient upset r/t pain medications that were ordered prior to DC and that she was DC with No DC VS and signing of paperwork per patient Patient ambulatory at time of DC and in NAD

## 2013-12-29 NOTE — ED Notes (Signed)
Patient with c/o "I have a UTI" Patient states that she was seen in this ED yesterday--chart review shows that patient was seen on 11/28 for c/o back pain, which is a chronic issue for patient  Patient ambulatory from triage Patient reports dysuria and that she "has to go all the time" Denies N/V Patent in NAD Urine specimen sent

## 2013-12-29 NOTE — ED Provider Notes (Signed)
CSN: [id number]     Arrival date & time 12/29/13  6222 History   First MD Initiated Contact with Patient 12/29/13 [phone]     Chief Complaint  Patient presents with  . Dysuria  . Pelvic Pain      HPI Pt was seen at 0755. Per pt, c/o gradual onset and persistence of constant dysuria and urinary frequency for the past 2 days.  Has been associated with suprapubic and left sided pelvic "pain." Denies no hematuria, no flank pain, no fevers, no N/V/D, no vaginal bleeding/discharge, no rash. The symptoms have been associated with no other complaints.  Pt was recently evaluated in the ED on 12/27/13 for left sided flank and abd pain with reassuring workup (labs, CT scan).     Past Medical History  Diagnosis Date  . Diabetes mellitus without complication   . Asthma   . Thyroid disease   . Chronic daily headache   . Chronic back pain   . Sciatica   . Chronic neck pain    Past Surgical History  Procedure Laterality Date  . Tubal ligation    . Cholecystectomy    . Cesarean section     Family History  Problem Relation Age of Onset  . Diabetes Mother   . Hypertension Mother   . Thyroid disease Mother    History  Substance Use Topics  . Smoking status: Never Smoker   . Smokeless tobacco: Not on file  . Alcohol Use: No   OB History    Gravida Para Term Preterm AB TAB SAB Ectopic Multiple Living   6 4 3 1 2  2   3      Review of Systems ROS: Statement: All systems negative except as marked or noted in the HPI; Constitutional: Negative for fever and chills. ; ; Eyes: Negative for eye pain, redness and discharge. ; ; ENMT: Negative for ear pain, hoarseness, nasal congestion, sinus pressure and sore throat. ; ; Cardiovascular: Negative for chest pain, palpitations, diaphoresis, dyspnea and peripheral edema. ; ; Respiratory: Negative for cough, wheezing and stridor. ; ; Gastrointestinal: Negative for nausea, vomiting, diarrhea, abdominal pain, blood in stool, hematemesis, jaundice and rectal  bleeding. . ; ; Genitourinary: +dysuria. Negative for flank pain and hematuria. ; ; GYN:  +pelvic pain. No vaginal bleeding, no vaginal discharge, no vulvar pain. ;; Musculoskeletal: Negative for back pain and neck pain. Negative for swelling and trauma.; ; Skin: Negative for pruritus, rash, abrasions, blisters, bruising and skin lesion.; ; Neuro: Negative for headache, lightheadedness and neck stiffness. Negative for weakness, altered level of consciousness , altered mental status, extremity weakness, paresthesias, involuntary movement, seizure and syncope.      Allergies  Bactrim and Penicillins  Home Medications   Prior to Admission medications   Medication Sig Start Date End Date Taking? Authorizing Provider  acetaminophen (TYLENOL) 500 MG tablet Take 1,000 mg by mouth every 6 (six) hours as needed for moderate pain.    Historical Provider, MD  albuterol (PROVENTIL HFA;VENTOLIN HFA) 108 (90 BASE) MCG/ACT inhaler Inhale 2 puffs into the lungs every 6 (six) hours as needed for wheezing or shortness of breath.    Historical Provider, MD  azithromycin (ZITHROMAX) 250 MG tablet Take 1 tablet (250 mg total) by mouth daily. Take first 2 tablets together, then 1 every day until finished. Patient not taking: Reported on 12/25/2013 10/20/13   Tatyana A Kirichenko, PA-C  chlorpheniramine-HYDROcodone (TUSSIONEX PENNKINETIC ER) 10-8 MG/5ML LQCR Take 5 mLs by mouth every 12 (twelve)  hours as needed for cough. Patient not taking: Reported on 12/25/2013 10/20/13   Tatyana A Kirichenko, PA-C  docusate sodium (COLACE) 100 MG capsule Take 1 capsule (100 mg total) by mouth 2 (two) times daily as needed. Patient not taking: Reported on 12/25/2013 09/24/13   Kathie Dike Leftwich-Kirby, CNM  ferrous sulfate (FERROUSUL) 325 (65 FE) MG tablet Take 1 tablet (325 mg total) by mouth 2 (two) times daily. Patient not taking: Reported on 12/25/2013 09/24/13   Kathie Dike Leftwich-Kirby, CNM  HYDROcodone-acetaminophen (NORCO/VICODIN)  5-325 MG per tablet Take 1 tablet by mouth every 4 (four) hours as needed for moderate pain. 12/25/13   Hoy Morn, MD  meloxicam (MOBIC) 7.5 MG tablet Take 2 tablets (15 mg total) by mouth daily. Patient not taking: Reported on 12/25/2013 08/30/13   Noland Fordyce, PA-C  predniSONE (DELTASONE) 10 MG tablet Take 5 tab day 1, take 4 tab day 2, take 3 tab day 3, take 2 tab day 4, and take 1 tab day 5 Patient not taking: Reported on 12/25/2013 10/20/13   Lahoma Rocker A Kirichenko, PA-C   BP 133/90 mmHg  Pulse 84  Temp(Src) 98.2 F (36.8 C) (Oral)  Resp 20  SpO2 100%  LMP 12/12/2013 Physical Exam  0800: Physical examination:  Nursing notes reviewed; Vital signs and O2 SAT reviewed;  Constitutional: Well developed, Well nourished, Well hydrated, In no acute distress; Head:  Normocephalic, atraumatic; Eyes: EOMI, PERRL, No scleral icterus; ENMT: Mouth and pharynx normal, Mucous membranes moist; Neck: Supple, Full range of motion, No lymphadenopathy; Cardiovascular: Regular rate and rhythm, No murmur, rub, or gallop; Respiratory: Breath sounds clear & equal bilaterally, No rales, rhonchi, wheezes.  Speaking full sentences with ease, Normal respiratory effort/excursion; Chest: Nontender, Movement normal; Abdomen: Soft, +suprapubic and left pelvic tenderness to palp. No rebound or guarding. Nondistended, Normal bowel sounds; Genitourinary: No CVA tenderness. Pelvic exam performed with permission of pt and female ED tech assist during exam.  External genitalia w/o lesions. Vaginal vault without discharge.  Cervix w/o lesions, not friable, GC/chlam and wet prep obtained and sent to lab.  Bimanual exam w/o CMT or right adnexal tenderness. +suprapubic and left pelvic tenderness.;; Extremities: Pulses normal, No tenderness, No edema, No calf edema or asymmetry.; Neuro: AA&Ox3, Major CN grossly intact.  Speech clear. No gross focal motor or sensory deficits in extremities. Climbs on and off stretcher easily by herself.  Gait steady.; Skin: Color normal, Warm, Dry.   ED Course  Procedures     MDM  MDM Reviewed: previous chart, nursing note and vitals Reviewed previous: CT scan and labs Interpretation: labs and ultrasound     Results for orders placed or performed during the hospital encounter of 12/29/13  Wet prep, genital  Result Value Ref Range   Yeast Wet Prep HPF POC NONE SEEN NONE SEEN   Trich, Wet Prep NONE SEEN NONE SEEN   Clue Cells Wet Prep HPF POC NONE SEEN NONE SEEN   WBC, Wet Prep HPF POC FEW (A) NONE SEEN  Urinalysis, Routine w reflex microscopic  Result Value Ref Range   Color, Urine YELLOW YELLOW   APPearance TURBID (A) CLEAR   Specific Gravity, Urine 1.011 1.005 - 1.030   pH 6.0 5.0 - 8.0   Glucose, UA NEGATIVE NEGATIVE mg/dL   Hgb urine dipstick LARGE (A) NEGATIVE   Bilirubin Urine NEGATIVE NEGATIVE   Ketones, ur NEGATIVE NEGATIVE mg/dL   Protein, ur 100 (A) NEGATIVE mg/dL   Urobilinogen, UA 0.2 0.0 - 1.0 mg/dL  Nitrite NEGATIVE NEGATIVE   Leukocytes, UA LARGE (A) NEGATIVE  Pregnancy, urine  Result Value Ref Range   Preg Test, Ur NEGATIVE NEGATIVE  Urine microscopic-add on  Result Value Ref Range   Squamous Epithelial / LPF MANY (A) RARE   WBC, UA TOO NUMEROUS TO COUNT <3 WBC/hpf   RBC / HPF FIELD OBSCURED BY WBC'S <3 RBC/hpf   Bacteria, UA RARE RARE   Urine-Other MICROSCOPIC EXAM PERFORMED ON UNCONCENTRATED URINE    Ct Abdomen Pelvis Wo Contrast 12/25/2013   CLINICAL DATA:  Acute left lower quadrant abdominal pain.  EXAM: CT ABDOMEN AND PELVIS WITHOUT CONTRAST  TECHNIQUE: Multidetector CT imaging of the abdomen and pelvis was performed following the standard protocol without IV contrast.  COMPARISON:  CT scan of October 13, 2010.  FINDINGS: Visualized lung bases appear normal. No significant osseous abnormality is noted.  Status post cholecystectomy. Fatty infiltration of the liver is noted with mild hepatomegaly. The spleen and pancreas appear normal. Adrenal  glands appear normal. Small nonobstructive calculus is noted in the upper pole collecting system of the left kidney. No hydronephrosis is noted. No definite ureteral calculi are noted. Stool is noted throughout the colon. There is no evidence of bowel obstruction. The appendix appears normal. No abnormal fluid collection is noted. Urinary bladder and uterus appear normal. Ovaries appear normal. No significant adenopathy is noted.  IMPRESSION: Mild hepatomegaly and fatty infiltration of hepatic parenchyma is noted. Status post cholecystectomy. Small nonobstructive left renal calculus. No hydronephrosis or renal obstruction is noted.   Electronically Signed   By: Sabino Dick M.D.   On: 12/25/2013 14:35   US Transvaginal Non-ob 12/29/2013   CLINICAL DATA:  Left worse than right pelvic pain for 3 weeks.  EXAM: TRANSABDOMINAL AND TRANSVAGINAL ULTRASOUND OF PELVIS  DOPPLER ULTRASOUND OF OVARIES  TECHNIQUE: Both transabdominal and transvaginal ultrasound examinations of the pelvis were performed. Transabdominal technique was performed for global imaging of the pelvis including uterus, ovaries, adnexal regions, and pelvic cul-de-sac.  It was necessary to proceed with endovaginal exam following the transabdominal exam to visualize the adnexa. Color and duplex Doppler ultrasound was utilized to evaluate blood flow to the ovaries.  COMPARISON:  CT abdomen and pelvis 12/25/2013. Pelvic ultrasound 10/13/2010.  FINDINGS: Uterus  Measurements: 11.7 x 5.0 x 7.2 cm. A posterior fibroid in the midbody to upper uterine segment measures 2.2 cm in diameter.  Endometrium  Thickness: 1.1 cm.  No focal abnormality visualized.  Right ovary  Measurements: 3.2 x 2.4 x 2.1 cm. Normal appearance/no adnexal mass.  Left ovary  Measurements: 2.8 x 1.5 x 2.1 cm. Normal appearance/no adnexal mass.  Pulsed Doppler evaluation of both ovaries demonstrates normal low-resistance arterial and venous waveforms.  Other findings  No free fluid.   IMPRESSION: No acute finding.  Negative for ovarian torsion.  Small uterine fibroid.   Electronically Signed   By: Inge Rise M.D.   On: 12/29/2013 10:11    1105:   Pt's VS remain stable, resps easy. Has been ambulatory with steady gait. Has tol PO well without N/V.+UTI, will tx with keflex.  Dx and testing d/w pt.  Questions answered.  Verb understanding, agreeable to d/c home with outpt f/u.    Francine Graven, DO 12/31/13 [phone]

## 2013-12-29 NOTE — Discharge Instructions (Signed)
°Emergency Department Resource Guide °1) Find a Doctor and Pay Out of Pocket °Although you won't have to find out who is covered by your insurance plan, it is a good idea to ask around and get recommendations. You will then need to call the office and see if the doctor you have chosen will accept you as a new patient and what types of options they offer for patients who are self-pay. Some doctors offer discounts or will set up payment plans for their patients who do not have insurance, but you will need to ask so you aren't surprised when you get to your appointment. ° °2) Contact Your Local Health Department °Not all health departments have doctors that can see patients for sick visits, but many do, so it is worth a call to see if yours does. If you don't know where your local health department is, you can check in your phone book. The CDC also has a tool to help you locate your state's health department, and many state websites also have listings of all of their local health departments. ° °3) Find a Walk-in Clinic °If your illness is not likely to be very severe or complicated, you may want to try a walk in clinic. These are popping up all over the country in pharmacies, drugstores, and shopping centers. They're usually staffed by nurse practitioners or physician assistants that have been trained to treat common illnesses and complaints. They're usually fairly quick and inexpensive. However, if you have serious medical issues or chronic medical problems, these are probably not your best option. ° °No Primary Care Doctor: °- Call [facility] at  832-8000 - they can help you locate a primary care doctor that  accepts your insurance, provides certain services, etc. °- Physician Referral Service- 1-800-533-3463 ° °Chronic Pain Problems: °Organization         Address  Phone   Notes  °[facility]  ([phone]) [phone] Patients need to be referred by their primary care doctor.  ° °Medication  Assistance: °Organization         Address  Phone   Notes  °[place] [facility] [address] °[place], [place] [address] (336) 641-8030 --Must be a resident of [place] °-- Must have NO insurance coverage whatsoever (no Medicaid/ Medicare, etc.) °-- The pt. MUST have a primary care doctor that directs their care regularly and follows them in the community °  °[facility]  (866) 331-1348   °[employer]  (888) 892-1162   ° °Agencies that provide inexpensive medical care: °Organization         Address  Phone   Notes  °[facility]  (336) 832-8035   °[facility]    (336) 832-7272   °[facility] [address] °[place], [place] [address] (336) 832-4777   °[facility] [address], °[place] (336) 271-4999   °[facility]    (336) 373-0678   °[facility]    (336) 272-1050   °[facility] ° [address], [place] Phone:  (336) 832-4444, Fax:  (336) 832-4440 Hours of Operation:  9 am - 6 pm, M-F.  Also accepts Medicaid/Medicare and self-pay.  °[facility] ° [address], [place] Phone: (336) [phone], Fax: (336) [phone]. Hours of Operation:  8:30 am - 5:30 pm, M-F.  Also accepts Medicaid and self-pay.  °[facility] [place] [address], [place] Phone: (336) 878-6027   °[facility] [address], [place], [place] (336)723-1848, Ext. 123 Mondays & Thursdays: 7-9 AM.  First 15 patients are seen on a first come, first serve basis. °  ° °Medicaid-accepting [place] Providers: ° °Organization         Address  Phone   Notes  °[facility] [address], [place] (336) 641-2100 Also accepts self-pay patients.  °[facility] [address], [place] ° (336) 856-9996   °[facility] [address] [place] [address], [place]  (336) 288-8857   °[facility] [address] [place] [address], [place] (336) 299-7000   °Veita Bland [address], [place]  ° (336) 373-1557 Only accepts [place] Access Medicaid patients after they have their name applied to their card.  ° °Self-Pay (no insurance) in [place]: ° °Organization         Address  Phone   Notes  °Sickle Cell Patients, [facility] [address], [place] (336) 832-1970   °[facility] [address], [place] (336) 832-4400   °[facility] ° [address], [place] (336) 992-4800   °[facility]/Dr. Osei-Bonsu ° [address] [place] [address], [place] or [address], [place] (336) [phone] Phone number for both [place] and [place] locations is the same.  °[facility] [address], [place] (336) 299-0000   °[facility] [address] [place] [address], [place] or [address] (336) 852-7530 °(336) 878-2260   °[facility] [address], [place] (336) 350-1642, phone; (336) 294-5005, fax Sees patients 1st and 3rd Saturday of every month.  Must not qualify for public or private insurance (i.e. Medicaid, Medicare, [place] Health Choice, Veterans' Benefits) • Household income should be no more than 200% of the poverty level •The clinic cannot treat you if you are pregnant or think you are pregnant • Sexually transmitted diseases are not treated at the clinic.  ° ° °Dental Care: °Organization         Address  Phone  Notes  °[place] [facility] [address], [place] (336) 641-6152 Accepts children up to age 21 who are enrolled in Medicaid or [place] Health Choice; pregnant women with a Medicaid card; and children who have applied for Medicaid or [place] Health Choice, but were declined, whose parents can pay a reduced fee at time of service.  °[place]  [facility] [place]  [address], [place] (336) 641-7733 Accepts children up to age 21 who are enrolled in Medicaid or [place] Health Choice; pregnant women with a Medicaid card; and children who have applied for Medicaid or [place] Health Choice, but were declined, whose parents can pay a reduced fee at time of service.  °[facility] ° [address], [place] (336) 641-4533 Patients are seen by appointment only. Walk-ins are not accepted. [facility] will see patients 18 years of age and older. °Monday - Tuesday (8am-5pm) °Most Wednesdays (8:30-5pm) °$30 per visit, cash only  °[facility] ° [address], [place] (336) 641-4533 Patients are seen by appointment only. Walk-ins are not accepted. [facility] will see patients 18 years of age and older. °One   Wednesday Evening (Monthly: Volunteer Based).  $30 per visit, cash only  °[facility]  (919) 537-3737 for adults; Children under age 4, call Graduate Pediatric Dentistry at ([phone]) [phone]. Children aged 4-14, please call (919) [phone] to request a pediatric application. ° Dental services are provided in all areas of dental care including fillings, crowns and bridges, complete and partial dentures, implants, gum treatment, root canals, and extractions. Preventive care is also provided. Treatment is provided to both adults and children. °Patients are selected via a lottery and there is often a waiting list. °  °[facility] [address], °[place] ° (336) 763-8833 [email or web address].[email or web address] °  °[facility] [address], [place], [place] (336)723-1848, Ext. 123 Second and Fourth Thursday of each month, opens at 6:30 AM; Clinic ends at 9 AM.  Patients are seen on a first-come first-served basis, and a limited number are seen during each clinic.  ° °[facility] ° [address], [place], [place] (336) 723-7904    Eligibility Requirements °You must have lived in [place], [place], or [place] counties for at least the last three months. °  You cannot be eligible for state or federal sponsored healthcare insurance, including Veterans Administration, Medicaid, or Medicare. °  You generally cannot be eligible for healthcare insurance through your employer.  °  How to apply: °Eligibility screenings are held every Tuesday and Wednesday afternoon from 1:00 pm until 4:00 pm. You do not need an appointment for the interview!  °[facility] [address], [place], [place] 336-631-2330   °[facility]  336-342-8273   °[facility]  336-703-3100   °[facility]  336-570-6415   ° °Behavioral Health Resources in the Community: °Intensive Outpatient Programs °Organization         Address  Phone  Notes  °[facility] [address], [place], [place] 336-878-6098   °[facility] [address], [place], [place] 336-832-9800   °[facility] [address], [place], [place] ° 336-882-2125   °[facility] [address],  °[place], [place] 1-800-853-5163 or 336-641-4981   °Substance Abuse Resources °Organization         Address  Phone  Notes  °[facility]  336-882-2125   °[facility]  336-784-9470   °[facility]  336-285-9073   °[facility]  336-845-3988   °Residential & Outpatient Substance Abuse Program  1-800-659-3381   °Psychological Services °Organization         Address  Phone  Notes  °[facility]  336- 832-9600   °[facility]  336- 378-7881   °[facility] [address], [place] 1-800-853-5163 or 336-641-4981   ° °Mobile Crisis Teams °Organization         Address  Phone  Notes  °[facility], Mobile Crisis Care Unit  1-877-626-1772   °Assertive °[facility] ° [address]  [place], [place] 336-834-9664   °Sharon DeEsch [address] °[place] 336-554-5454   ° °Self-Help/Support Groups °Organization         Address  Phone             Notes  °[facility] [place] - variety of support groups  336- 373-1402 Call for more information  °Narcotics Anonymous (NA), [facility] [address], °[place]  2 meetings at this location  ° °  Residential Treatment Programs Organization         Address  Phone  Notes  [facility] [address],    [place]  1-(787)703-2844   [facility]  [address] [place] [address], [place], [place]   [place], [place] [phone] Admissions: 8am-3pm M-F  [facility] [address],    [place], [place] 045-409-8119   [facility] [address] [place], [place], [place]   [facility] [address]  [place], [place]   [facility] Intensive Outpatient [address], [place], [place]   [facility] ([place] [place].,  [place], [place] 1-(606)101-1313 or 779-487-6980   [facility] [address], [place], [place] Accepts Medicaid  [facility] [address]  [place] 1-(279)483-7359 Substance Abuse/Addiction Treatment   [facility] Organization         Address  Phone  Notes  [facility]  [phone]   Domenic Schwab, PhD [address] Arlis [place], [place]   308-859-9659 or (225)639-8842   [facility] [place], [place] 336-153-6173   [facility] [address], [place], [place] (702) 678-3069 Insurance/Medicaid/sponsorship through [facility] [address]                                    [place], [place] [phone] [place] [address], [place] [phone]    Dr. Adele Schilder  [phone]   [facility] Dept. 1) [address], [place] 2) [place] 3)  [address], [place] [phone] 386-725-6789  [phone]   [place] (330)[phone] or [phone] (After Hours)      Take the prescriptions as directed.  Apply moist heat to the area(s) of discomfort, for 15 minutes at a time, several times per day for the next few days.  Do not fall asleep on a heating pack.  Call your regular OB/GYN doctor on today to schedule a follow up appointment in the next week.  Return to the Emergency Department immediately if worsening.

## 2013-12-29 NOTE — ED Notes (Signed)
MD at bedside. 

## 2013-12-29 NOTE — ED Notes (Signed)
Patient asking for pain medication EDP made aware

## 2013-12-29 NOTE — ED Notes (Signed)
Patient transported to Ultrasound 

## 2013-12-29 NOTE — ED Notes (Signed)
Discharge instructions reviewed with patient--agrees and verbalized understanding Patient informed of need to make and keep follow up appointment with [facility]--agrees and verbalized understanding DC Rx x 2 reviewed with patient--agrees and verbalized understanding VS updated and stable--reviewed with patient at time of DC--agrees and verbalized understanding Patient alert and oriented x 4 and in NAD at time of discharge All questions related to this ED visit, DC instructions, DC prescriptions and follow up care answered to patient's satisfaction by this nurse

## 2013-12-29 NOTE — ED Notes (Signed)
Patient back from [place] Patient in NAD

## 2014-01-02 ENCOUNTER — Encounter (HOSPITAL_COMMUNITY): Payer: Self-pay | Admitting: Emergency Medicine

## 2014-01-02 ENCOUNTER — Emergency Department (HOSPITAL_COMMUNITY)
Admission: EM | Admit: 2014-01-02 | Discharge: 2014-01-02 | Disposition: A | Discharge status: Home / Self Care | Payer: BC Managed Care – PPO | Attending: Emergency Medicine | Admitting: Emergency Medicine

## 2014-01-02 DIAGNOSIS — M5430 Sciatica, unspecified side: Secondary | ICD-10-CM | POA: Insufficient documentation

## 2014-01-02 DIAGNOSIS — Y9289 Other specified places as the place of occurrence of the external cause: Secondary | ICD-10-CM | POA: Insufficient documentation

## 2014-01-02 DIAGNOSIS — M791 Myalgia, unspecified site: Secondary | ICD-10-CM

## 2014-01-02 DIAGNOSIS — G8929 Other chronic pain: Secondary | ICD-10-CM | POA: Insufficient documentation

## 2014-01-02 DIAGNOSIS — Z792 Long term (current) use of antibiotics: Secondary | ICD-10-CM | POA: Diagnosis not present

## 2014-01-02 DIAGNOSIS — S60222A Contusion of left hand, initial encounter: Secondary | ICD-10-CM | POA: Insufficient documentation

## 2014-01-02 DIAGNOSIS — Y9389 Activity, other specified: Secondary | ICD-10-CM | POA: Diagnosis not present

## 2014-01-02 DIAGNOSIS — E119 Type 2 diabetes mellitus without complications: Secondary | ICD-10-CM | POA: Diagnosis not present

## 2014-01-02 DIAGNOSIS — W108XXA Fall (on) (from) other stairs and steps, initial encounter: Secondary | ICD-10-CM | POA: Diagnosis not present

## 2014-01-02 DIAGNOSIS — J45909 Unspecified asthma, uncomplicated: Secondary | ICD-10-CM | POA: Diagnosis not present

## 2014-01-02 DIAGNOSIS — Z79899 Other long term (current) drug therapy: Secondary | ICD-10-CM | POA: Insufficient documentation

## 2014-01-02 DIAGNOSIS — S60211A Contusion of right wrist, initial encounter: Secondary | ICD-10-CM | POA: Diagnosis not present

## 2014-01-02 DIAGNOSIS — Z880 Allergy status to penicillin: Secondary | ICD-10-CM | POA: Insufficient documentation

## 2014-01-02 DIAGNOSIS — Y998 Other external cause status: Secondary | ICD-10-CM | POA: Insufficient documentation

## 2014-01-02 DIAGNOSIS — S79921A Unspecified injury of right thigh, initial encounter: Secondary | ICD-10-CM | POA: Insufficient documentation

## 2014-01-02 DIAGNOSIS — Z8639 Personal history of other endocrine, nutritional and metabolic disease: Secondary | ICD-10-CM | POA: Insufficient documentation

## 2014-01-02 MED ORDER — IBUPROFEN 800 MG PO TABS: 800.0000 mg | ORAL_TABLET | Freq: Three times a day (TID) | ORAL | Status: DC

## 2014-01-02 NOTE — ED Provider Notes (Signed)
CSN: [id number]     Arrival date & time 01/02/14  2239 History  This chart was scribed for non-physician practitioner, Charlann Lange, PA-C working with Wandra Arthurs, MD by Frederich Balding, ED scribe. This patient was seen in room WTR7/WTR7 and the patient's care was started at 11:14 PM.   Chief Complaint  Patient presents with  . Leg Pain   The history is provided by the patient. No language interpreter was used.    HPI Comments: [name] is a 43 y.o. female who presents to the Emergency Department complaining of a fall that occurred 4 days ago. States she fell down 2 steps, hit her hand on the railing and landed on her right side. Denies hitting her head or LOC. Reports left wrist and hand pain with associated tingling and worsening right thigh pain. Bearing weight worsens the pain. She has taken tylenol and ibuprofen with no relief. Pt is left hand dominant.   Past Medical History  Diagnosis Date  . Diabetes mellitus without complication   . Asthma   . Thyroid disease   . Chronic daily headache   . Chronic back pain   . Sciatica   . Chronic neck pain    Past Surgical History  Procedure Laterality Date  . Tubal ligation    . Cholecystectomy    . Cesarean section     Family History  Problem Relation Age of Onset  . Diabetes Mother   . Hypertension Mother   . Thyroid disease Mother    History  Substance Use Topics  . Smoking status: Never Smoker   . Smokeless tobacco: Not on file  . Alcohol Use: No   OB History    Gravida Para Term Preterm AB TAB SAB Ectopic Multiple Living   6 4 3 1 2  2   3      Review of Systems  Respiratory: Negative for shortness of breath.   Cardiovascular: Negative for chest pain.  Gastrointestinal: Negative for abdominal pain.  Musculoskeletal: Positive for myalgias and arthralgias.       See HPI.  Skin: Negative for wound.  Neurological: Negative for headaches.  All other systems reviewed and are negative.  Allergies  Bactrim and  Penicillins  Home Medications   Prior to Admission medications   Medication Sig Start Date End Date Taking? Authorizing Provider  acetaminophen (TYLENOL) 500 MG tablet Take 1,000 mg by mouth every 6 (six) hours as needed for moderate pain.   Yes Historical Provider, MD  albuterol (PROVENTIL HFA;VENTOLIN HFA) 108 (90 BASE) MCG/ACT inhaler Inhale 2 puffs into the lungs every 6 (six) hours as needed for wheezing or shortness of breath.   Yes Historical Provider, MD  cephALEXin (KEFLEX) 500 MG capsule Take 1 capsule (500 mg total) by mouth 4 (four) times daily. 12/29/13  Yes Francine Graven, DO  HYDROcodone-acetaminophen (NORCO/VICODIN) 5-325 MG per tablet Take 1 tablet by mouth every 4 (four) hours as needed for moderate pain. 12/25/13  Yes Hoy Morn, MD  naproxen (NAPROSYN) 250 MG tablet Take 1 tablet (250 mg total) by mouth 2 (two) times daily as needed for mild pain or moderate pain (take with food). 12/29/13  Yes Francine Graven, DO  predniSONE (DELTASONE) 10 MG tablet Take 5 tab day 1, take 4 tab day 2, take 3 tab day 3, take 2 tab day 4, and take 1 tab day 5 Patient not taking: Reported on 12/25/2013 10/20/13   Tatyana A Kirichenko, PA-C   BP 138/84 mmHg  Pulse 100  Temp(Src) 98.7 F (37.1 C) (Oral)  Resp 18  Ht 5\' 2"  (1.575 m)  Wt 220 lb (99.791 kg)  BMI 40.23 kg/m2  SpO2 100%  LMP 12/12/2013   Physical Exam  Constitutional: She is oriented to person, place, and time. She appears well-developed and well-nourished. No distress.  HENT:  Head: Normocephalic and atraumatic.  Eyes: Conjunctivae and EOM are normal.  Neck: Neck supple. No tracheal deviation present.  Cardiovascular: Normal rate.   Pulmonary/Chest: Effort normal. No respiratory distress.  Musculoskeletal: Normal range of motion.  Mild proximal anterior right thigh tenderness without bony deformity. No hip tenderness. Full ROM of hip joint. Left hand has residual bruising to ulnar surface without bony deformity. Full  ROM of all fingers and wrist. There is a resolving bruise to the right volar wrist with preserved ROM.   Neurological: She is alert and oriented to person, place, and time.  Skin: Skin is warm and dry.  Psychiatric: She has a normal mood and affect. Her behavior is normal.  Nursing note and vitals reviewed.   ED Course  Procedures (including critical care time)  DIAGNOSTIC STUDIES: Oxygen Saturation is 100% on RA, normal by my interpretation.    COORDINATION OF CARE: 11:18 PM-Discussed treatment plan which includes medications with pt at bedside and pt agreed to plan. Will give pt an orthopedic referral and advised her to follow up if symptoms do not start resolving.   Labs Review Labs Reviewed - No data to display  Imaging Review No results found.   EKG Interpretation None      MDM   Final diagnoses:  None    1. Muscular soreness  Exam supports muscular soreness after fall x 5 days ago. She is well appearing and ambulatory without difficulty. Do no suspect fracture injuries.   I personally performed the services described in this documentation, which was scribed in my presence. The recorded information has been reviewed and is accurate.  Dewaine Oats, PA-C 01/02/14 [place] Y[place], MD 01/03/14 1159

## 2014-01-02 NOTE — Discharge Instructions (Signed)
Muscle Strain °A muscle strain is an injury that occurs when a muscle is stretched beyond its normal length. Usually a small number of muscle fibers are torn when this happens. Muscle strain is rated in degrees. First-degree strains have the least amount of muscle fiber tearing and pain. Second-degree and third-degree strains have increasingly more tearing and pain.  °Usually, recovery from muscle strain takes 1-2 weeks. Complete healing takes 5-6 weeks.  °CAUSES  °Muscle strain happens when a sudden, violent force placed on a muscle stretches it too far. This may occur with lifting, sports, or a fall.  °RISK FACTORS °Muscle strain is especially common in athletes.  °SIGNS AND SYMPTOMS °At the site of the muscle strain, there may be: °· Pain. °· Bruising. °· Swelling. °· Difficulty using the muscle due to pain or lack of normal function. °DIAGNOSIS  °Your health care provider will perform a physical exam and ask about your medical history. °TREATMENT  °Often, the best treatment for a muscle strain is resting, icing, and applying cold compresses to the injured area.   °HOME CARE INSTRUCTIONS  °· Use the PRICE method of treatment to promote muscle healing during the first 2-3 days after your injury. The PRICE method involves: °¨ Protecting the muscle from being injured again. °¨ Restricting your activity and resting the injured body part. °¨ Icing your injury. To do this, put ice in a plastic bag. Place a towel between your skin and the bag. Then, apply the ice and leave it on from 15-20 minutes each hour. After the third day, switch to moist heat packs. °¨ Apply compression to the injured area with a splint or elastic bandage. Be careful not to wrap it too tightly. This may interfere with blood circulation or increase swelling. °¨ Elevate the injured body part above the level of your heart as often as you can. °· Only take over-the-counter or prescription medicines for pain, discomfort, or fever as directed by your  health care provider. °· Warming up prior to exercise helps to prevent future muscle strains. °SEEK MEDICAL CARE IF:  °· You have increasing pain or swelling in the injured area. °· You have numbness, tingling, or a significant loss of strength in the injured area. °MAKE SURE YOU:  °· Understand these instructions. °· Will watch your condition. °· Will get help right away if you are not doing well or get worse. °Document Released: 01/14/2005 Document Revised: 11/04/2012 Document Reviewed: 08/13/2012 °Exit[employer]® Patient Information ©2015 [employer]. This information is not intended to replace advice given to you by your health care provider. Make sure you discuss any questions you have with your health care provider. °Cryotherapy °Cryotherapy means treatment with cold. Ice or gel packs can be used to reduce both pain and swelling. Ice is the most helpful within the first 24 to 48 hours after an injury or flare-up from overusing a muscle or joint. Sprains, strains, spasms, burning pain, shooting pain, and aches can all be eased with ice. Ice can also be used when recovering from surgery. Ice is effective, has very few side effects, and is safe for most people to use. °PRECAUTIONS  °Ice is not a safe treatment option for people with: °· Raynaud phenomenon. This is a condition affecting small blood vessels in the extremities. Exposure to cold may cause your problems to return. °· Cold hypersensitivity. There are many forms of cold hypersensitivity, including: °· Cold urticaria. Red, itchy hives appear on the skin when the tissues begin to warm after being   iced. °· Cold erythema. This is a red, itchy rash caused by exposure to cold. °· Cold hemoglobinuria. Red blood cells break down when the tissues begin to warm after being iced. The hemoglobin that carry oxygen are passed into the urine because they cannot combine with blood proteins fast enough. °· Numbness or altered sensitivity in the area being iced. °If you have  any of the following conditions, do not use ice until you have discussed cryotherapy with your caregiver: °· Heart conditions, such as arrhythmia, angina, or chronic heart disease. °· High blood pressure. °· Healing wounds or open skin in the area being iced. °· Current infections. °· Rheumatoid arthritis. °· Poor circulation. °· Diabetes. °Ice slows the blood flow in the region it is applied. This is beneficial when trying to stop inflamed tissues from spreading irritating chemicals to surrounding tissues. However, if you expose your skin to cold temperatures for too long or without the proper protection, you can damage your skin or nerves. Watch for signs of skin damage due to cold. °HOME CARE INSTRUCTIONS °Follow these tips to use ice and cold packs safely. °· Place a dry or damp towel between the ice and skin. A damp towel will cool the skin more quickly, so you may need to shorten the time that the ice is used. °· For a more rapid response, add gentle compression to the ice. °· Ice for no more than 10 to 20 minutes at a time. The bonier the area you are icing, the less time it will take to get the benefits of ice. °· Check your skin after 5 minutes to make sure there are no signs of a poor response to cold or skin damage. °· Rest 20 minutes or more between uses. °· Once your skin is numb, you can end your treatment. You can test numbness by very lightly touching your skin. The touch should be so light that you do not see the skin dimple from the pressure of your fingertip. When using ice, most people will feel these normal sensations in this order: cold, burning, aching, and numbness. °· Do not use ice on someone who cannot communicate their responses to pain, such as small children or people with dementia. °HOW TO MAKE AN ICE PACK °Ice packs are the most common way to use ice therapy. Other methods include ice massage, ice baths, and cryosprays. Muscle creams that cause a cold, tingly feeling do not offer the  same benefits that ice offers and should not be used as a substitute unless recommended by your caregiver. °To make an ice pack, do one of the following: °· Place crushed ice or a bag of frozen vegetables in a sealable plastic bag. Squeeze out the excess air. Place this bag inside another plastic bag. Slide the bag into a pillowcase or place a damp towel between your skin and the bag. °· Mix 3 parts water with 1 part rubbing alcohol. Freeze the mixture in a sealable plastic bag. When you remove the mixture from the freezer, it will be slushy. Squeeze out the excess air. Place this bag inside another plastic bag. Slide the bag into a pillowcase or place a damp towel between your skin and the bag. °SEEK MEDICAL CARE IF: °· You develop white spots on your skin. This may give the skin a blotchy (mottled) appearance. °· Your skin turns blue or pale. °· Your skin becomes waxy or hard. °· Your swelling gets worse. °MAKE SURE YOU:  °· Understand these instructions. °·   Will watch your condition. °· Will get help right away if you are not doing well or get worse. °Document Released: 09/10/2010 Document Revised: 05/31/2013 Document Reviewed: 09/10/2010 °ExitCare® Patient Information ©2015 [employer]. This information is not intended to replace advice given to you by your health care provider. Make sure you discuss any questions you have with your health care provider. ° °

## 2014-01-02 NOTE — ED Notes (Signed)
Pt reports falling down a couple steps Wednesday night, c/o pain to anterior upper R leg just below hip joint. Pain worse with standing and walking. Pt walks with steady gait. Pt denies any head injury, no lightheadedness or dizziness. Pt A&O x 4.

## 2014-02-28 ENCOUNTER — Emergency Department (HOSPITAL_COMMUNITY)
Admission: EM | Admit: 2014-02-28 | Discharge: 2014-03-01 | Disposition: A | Discharge status: Home / Self Care | Payer: BLUE CROSS/BLUE SHIELD | Attending: Emergency Medicine | Admitting: Emergency Medicine

## 2014-02-28 ENCOUNTER — Encounter (HOSPITAL_COMMUNITY): Payer: Self-pay | Admitting: Adult Health

## 2014-02-28 DIAGNOSIS — R000 Tachycardia, unspecified: Secondary | ICD-10-CM | POA: Diagnosis not present

## 2014-02-28 DIAGNOSIS — Z791 Long term (current) use of non-steroidal anti-inflammatories (NSAID): Secondary | ICD-10-CM | POA: Diagnosis not present

## 2014-02-28 DIAGNOSIS — E119 Type 2 diabetes mellitus without complications: Secondary | ICD-10-CM | POA: Diagnosis not present

## 2014-02-28 DIAGNOSIS — R05 Cough: Secondary | ICD-10-CM | POA: Diagnosis present

## 2014-02-28 DIAGNOSIS — Z792 Long term (current) use of antibiotics: Secondary | ICD-10-CM | POA: Insufficient documentation

## 2014-02-28 DIAGNOSIS — Z8639 Personal history of other endocrine, nutritional and metabolic disease: Secondary | ICD-10-CM | POA: Diagnosis not present

## 2014-02-28 DIAGNOSIS — G8929 Other chronic pain: Secondary | ICD-10-CM | POA: Insufficient documentation

## 2014-02-28 DIAGNOSIS — Z880 Allergy status to penicillin: Secondary | ICD-10-CM | POA: Insufficient documentation

## 2014-02-28 DIAGNOSIS — M5430 Sciatica, unspecified side: Secondary | ICD-10-CM | POA: Insufficient documentation

## 2014-02-28 DIAGNOSIS — J45901 Unspecified asthma with (acute) exacerbation: Secondary | ICD-10-CM

## 2014-02-28 NOTE — ED Notes (Signed)
Presents with fever, cough and body aches began 3 days ago. Pt has non productive cough, dneis nausea, vomiting and diarrhea, denies sore throat. Temp orally 100.2, endorses chills. HR 122

## 2014-03-01 ENCOUNTER — Emergency Department (HOSPITAL_COMMUNITY): Payer: BLUE CROSS/BLUE SHIELD

## 2014-03-01 LAB — CBC WITH DIFFERENTIAL/PLATELET
BASOS PCT: 0 % (ref 0–1)
Basophils Absolute: 0 10*3/uL (ref 0.0–0.1)
Eosinophils Absolute: 0 10*3/uL (ref 0.0–0.7)
Eosinophils Relative: 1 % (ref 0–5)
HEMATOCRIT: 34.9 % — AB (ref 36.0–46.0)
HEMOGLOBIN: 11.4 g/dL — AB (ref 12.0–15.0)
LYMPHS ABS: 2.3 10*3/uL (ref 0.7–4.0)
LYMPHS PCT: 39 % (ref 12–46)
MCH: 28 pg (ref 26.0–34.0)
MCHC: 32.7 g/dL (ref 30.0–36.0)
MCV: 85.7 fL (ref 78.0–100.0)
Monocytes Absolute: 0.5 10*3/uL (ref 0.1–1.0)
Monocytes Relative: 8 % (ref 3–12)
Neutro Abs: 3.1 10*3/uL (ref 1.7–7.7)
Neutrophils Relative %: 52 % (ref 43–77)
PLATELETS: 329 10*3/uL (ref 150–400)
RBC: 4.07 MIL/uL (ref 3.87–5.11)
RDW: 14.4 % (ref 11.5–15.5)
WBC: 6 10*3/uL (ref 4.0–10.5)

## 2014-03-01 LAB — BASIC METABOLIC PANEL
ANION GAP: 10 (ref 5–15)
BUN: 12 mg/dL (ref 6–23)
CALCIUM: 8.9 mg/dL (ref 8.4–10.5)
CHLORIDE: 104 mmol/L (ref 96–112)
CO2: 22 mmol/L (ref 19–32)
Creatinine, Ser: 0.92 mg/dL (ref 0.50–1.10)
GFR calc Af Amer: 87 mL/min — ABNORMAL LOW (ref >90)
GFR calc non Af Amer: 75 mL/min — ABNORMAL LOW (ref >90)
Glucose, Bld: 127 mg/dL — ABNORMAL HIGH (ref 70–99)
Potassium: 3.8 mmol/L (ref 3.5–5.1)
Sodium: 136 mmol/L (ref 135–145)

## 2014-03-01 MED ORDER — PREDNISONE 10 MG PO TABS: 20.0000 mg | ORAL_TABLET | Freq: Every day | ORAL | Status: DC

## 2014-03-01 MED ORDER — PREDNISONE 20 MG PO TABS
60.0000 mg | ORAL_TABLET | Freq: Once | ORAL | Status: AC
  Administered 2014-03-01: 60 mg via ORAL
  Filled 2014-03-01: qty 3

## 2014-03-01 NOTE — Discharge Instructions (Signed)
Asthma °Asthma is a condition of the lungs in which the airways tighten and narrow. Asthma can make it hard to breathe. Asthma cannot be cured, but medicine and lifestyle changes can help control it. Asthma may be started (triggered) by: °· Animal skin flakes (dander). °· Dust. °· Cockroaches. °· Pollen. °· Mold. °· Smoke. °· Cleaning products. °· Hair sprays or aerosol sprays. °· Paint fumes or strong smells. °· Cold air, weather changes, and winds. °· Crying or laughing hard. °· Stress. °· Certain medicines or drugs. °· Foods, such as dried fruit, potato chips, and sparkling grape juice. °· Infections or conditions (colds, flu). °· Exercise. °· Certain medical conditions or diseases. °· Exercise or tiring activities. °HOME CARE  °· Take medicine as told by your doctor. °· Use a peak flow meter as told by your doctor. A peak flow meter is a tool that measures how well the lungs are working. °· Record and keep track of the peak flow meter's readings. °· Understand and use the asthma action plan. An asthma action plan is a written plan for taking care of your asthma and treating your attacks. °· To help prevent asthma attacks: °¨ Do not smoke. Stay away from secondhand smoke. °¨ Change your heating and air conditioning filter often. °¨ Limit your use of fireplaces and wood stoves. °¨ Get rid of pests (such as roaches and mice) and their droppings. °¨ Throw away plants if you see mold on them. °¨ Clean your floors. Dust regularly. Use cleaning products that do not smell. °¨ Have someone vacuum when you are not home. Use a vacuum cleaner with a HEPA filter if possible. °¨ Replace carpet with wood, tile, or vinyl flooring. Carpet can trap animal skin flakes and dust. °¨ Use allergy-proof pillows, mattress covers, and box spring covers. °¨ Wash bed sheets and blankets every week in hot water and dry them in a dryer. °¨ Use blankets that are made of polyester or cotton. °¨ Clean bathrooms and kitchens with bleach. If  possible, have someone repaint the walls in these rooms with mold-resistant paint. Keep out of the rooms that are being cleaned and painted. °¨ Wash hands often. °GET HELP IF: °· You have make a whistling sound when breaking (wheeze), have shortness of breath, or have a cough even if taking medicine to prevent attacks. °· The colored mucus you cough up (sputum) is thicker than usual. °· The colored mucus you cough up changes from clear or white to yellow, green, gray, or bloody. °· You have problems from the medicine you are taking such as: °¨ A rash. °¨ Itching. °¨ Swelling. °¨ Trouble breathing. °· You need reliever medicines more than 2-3 times a week. °· Your peak flow measurement is still at 50-79% of your personal best after following the action plan for 1 hour. °· You have a fever. °GET HELP RIGHT AWAY IF:  °· You seem to be worse and are not responding to medicine during an asthma attack. °· You are short of breath even at rest. °· You get short of breath when doing very little activity. °· You have trouble eating, drinking, or talking. °· You have chest pain. °· You have a fast heartbeat. °· Your lips or fingernails start to turn blue. °· You are light-headed, dizzy, or faint. °· Your peak flow is less than 50% of your personal best. °MAKE SURE YOU:  °· Understand these instructions. °· Will watch your condition. °· Will get help right away if you   are not doing well or get worse. Document Released: 07/03/2007 Document Revised: 05/31/2013 Document Reviewed: 08/13/2012 [facility] Patient Information 2015 [place], [place]. This information is not intended to replace advice given to you by your health care provider. Make sure you discuss any questions you have with your health care provider. Please take the steroid as directed until all tablets have been completed.  Usual inhaler every 4-6 hours while awake for the next several days and then per routine

## 2014-03-01 NOTE — ED Provider Notes (Signed)
CSN: [id number]     Arrival date & time 02/28/14  2330 History   First MD Initiated Contact with Patient 02/28/14 2356     Chief Complaint  Patient presents with  . Fever  . Cough     (Consider location/radiation/quality/duration/timing/severity/associated sxs/prior Treatment) HPI Comments: Patient reports that she has had URI symptoms for the past week developed myalgias and cough several days ago, not relieved by her albuterol is nonproductive.  He cough.  She's been taking over-the-counter NyQuil without any relief  Patient is a 44 y.o. female presenting with fever and cough. The history is provided by the patient.  Fever Temp source:  Subjective Severity:  Moderate Onset quality:  Gradual Timing:  Intermittent Progression:  Improving Chronicity:  New Relieved by:  Acetaminophen Worsened by:  Nothing tried Ineffective treatments:  None tried Associated symptoms: cough and rhinorrhea   Associated symptoms: no chest pain, no chills, no myalgias, no nausea, no rash, no sore throat and no vomiting   Cough:    Cough characteristics:  Non-productive, dry and hacking   Onset quality:  Gradual   Timing:  Intermittent   Progression:  Unchanged   Chronicity:  New Cough Associated symptoms: fever, rhinorrhea and wheezing   Associated symptoms: no chest pain, no chills, no myalgias, no rash and no sore throat     Past Medical History  Diagnosis Date  . Diabetes mellitus without complication   . Asthma   . Thyroid disease   . Chronic daily headache   . Chronic back pain   . Sciatica   . Chronic neck pain    Past Surgical History  Procedure Laterality Date  . Tubal ligation    . Cholecystectomy    . Cesarean section     Family History  Problem Relation Age of Onset  . Diabetes Mother   . Hypertension Mother   . Thyroid disease Mother    History  Substance Use Topics  . Smoking status: Never Smoker   . Smokeless tobacco: Not on file  . Alcohol Use: No   OB History     Gravida Para Term Preterm AB TAB SAB Ectopic Multiple Living   6 4 3 1 2  2   3      Review of Systems  Constitutional: Positive for fever. Negative for chills.  HENT: Positive for rhinorrhea. Negative for sore throat.   Respiratory: Positive for cough and wheezing.   Cardiovascular: Negative for chest pain.  Gastrointestinal: Negative for nausea and vomiting.  Musculoskeletal: Positive for arthralgias. Negative for myalgias.  Skin: Negative for rash.  All other systems reviewed and are negative.     Allergies  Bactrim and Penicillins  Home Medications   Prior to Admission medications   Medication Sig Start Date End Date Taking? Authorizing Provider  acetaminophen (TYLENOL) 500 MG tablet Take 1,000 mg by mouth every 6 (six) hours as needed for moderate pain.    Historical Provider, MD  albuterol (PROVENTIL HFA;VENTOLIN HFA) 108 (90 BASE) MCG/ACT inhaler Inhale 2 puffs into the lungs every 6 (six) hours as needed for wheezing or shortness of breath.    Historical Provider, MD  cephALEXin (KEFLEX) 500 MG capsule Take 1 capsule (500 mg total) by mouth 4 (four) times daily. 12/29/13   Francine Graven, DO  HYDROcodone-acetaminophen (NORCO/VICODIN) 5-325 MG per tablet Take 1 tablet by mouth every 4 (four) hours as needed for moderate pain. 12/25/13   Hoy Morn, MD  ibuprofen (ADVIL,MOTRIN) 800 MG tablet Take 1 tablet (  800 mg total) by mouth 3 (three) times daily. 01/02/14   Shari A Upstill, PA-C  naproxen (NAPROSYN) 250 MG tablet Take 1 tablet (250 mg total) by mouth 2 (two) times daily as needed for mild pain or moderate pain (take with food). 12/29/13   Francine Graven, DO  predniSONE (DELTASONE) 10 MG tablet Take 2 tablets (20 mg total) by mouth daily. 03/01/14   Garald Balding, NP   BP 126/56 mmHg  Pulse 122  Temp(Src) 100.2 F (37.9 C) (Oral)  Resp 20  SpO2 97%  LMP 01/18/2014 Physical Exam  Constitutional: She is oriented to person, place, and time. She appears  well-developed and well-nourished.  HENT:  Head: Normocephalic.  Mouth/Throat: Oropharynx is clear and moist.  Eyes: Pupils are equal, round, and reactive to light.  Neck: Normal range of motion.  Cardiovascular: Regular rhythm.  Tachycardia present.   Pulmonary/Chest: Effort normal and breath sounds normal. No respiratory distress. She has no wheezes. She exhibits no tenderness.  Abdominal: Soft.  Musculoskeletal: Normal range of motion. She exhibits no edema or tenderness.  Lymphadenopathy:    She has no cervical adenopathy.  Neurological: She is alert and oriented to person, place, and time.  Skin: Skin is warm. There is pallor.  Nursing note and vitals reviewed.   ED Course  Procedures (including critical care time) Labs Review Labs Reviewed  CBC WITH DIFFERENTIAL/PLATELET - Abnormal; Notable for the following:    Hemoglobin 11.4 (*)    HCT 34.9 (*)    All other components within normal limits  BASIC METABOLIC PANEL - Abnormal; Notable for the following:    Glucose, Bld 127 (*)    GFR calc non Af Amer 75 (*)    GFR calc Af Amer 87 (*)    All other components within normal limits    Imaging Review Dg Chest 2 View  03/01/2014   CLINICAL DATA:  Initial evaluation for shortness of breath, cough, fever for 2 weeks.  EXAM: CHEST  2 VIEW  COMPARISON:  Prior radiograph from 10/20/2013  FINDINGS: The cardiac and mediastinal silhouettes are stable in size and contour, and remain within normal limits.  The lungs are normally inflated. Mild diffuse peribronchial thickening noted, which may be related to history of asthma, similar to prior. No airspace consolidation, pleural effusion, or pulmonary edema is identified. There is no pneumothorax.  No acute osseous abnormality identified.  IMPRESSION: No active cardiopulmonary disease.   Electronically Signed   By: Jeannine Boga M.D.   On: 03/01/2014 00:56     EKG Interpretation None     Patient states that are reviewed.  No  pneumonia.  She'll be treated with short course of steroids use of inhaler for asthma exacerbation/bronchitis MDM   Final diagnoses:  Asthma exacerbation         Garald Balding, NP 03/01/14 0148  Tanna Furry, MD 03/02/14 [phone]

## 2014-03-01 NOTE — ED Notes (Signed)
Pt. Left with all belongings and refused wheelchair 

## 2014-06-07 ENCOUNTER — Emergency Department (HOSPITAL_COMMUNITY): Payer: BLUE CROSS/BLUE SHIELD

## 2014-06-07 ENCOUNTER — Encounter (HOSPITAL_COMMUNITY): Payer: Self-pay | Admitting: Emergency Medicine

## 2014-06-07 ENCOUNTER — Emergency Department (HOSPITAL_COMMUNITY)
Admission: EM | Admit: 2014-06-07 | Discharge: 2014-06-07 | Disposition: A | Discharge status: Home / Self Care | Payer: BLUE CROSS/BLUE SHIELD | Attending: Emergency Medicine | Admitting: Emergency Medicine

## 2014-06-07 DIAGNOSIS — Z792 Long term (current) use of antibiotics: Secondary | ICD-10-CM | POA: Insufficient documentation

## 2014-06-07 DIAGNOSIS — S29011A Strain of muscle and tendon of front wall of thorax, initial encounter: Secondary | ICD-10-CM | POA: Diagnosis not present

## 2014-06-07 DIAGNOSIS — G8929 Other chronic pain: Secondary | ICD-10-CM | POA: Diagnosis not present

## 2014-06-07 DIAGNOSIS — Y9389 Activity, other specified: Secondary | ICD-10-CM | POA: Insufficient documentation

## 2014-06-07 DIAGNOSIS — S29012A Strain of muscle and tendon of back wall of thorax, initial encounter: Secondary | ICD-10-CM | POA: Insufficient documentation

## 2014-06-07 DIAGNOSIS — Z880 Allergy status to penicillin: Secondary | ICD-10-CM | POA: Insufficient documentation

## 2014-06-07 DIAGNOSIS — Z7952 Long term (current) use of systemic steroids: Secondary | ICD-10-CM | POA: Diagnosis not present

## 2014-06-07 DIAGNOSIS — J45909 Unspecified asthma, uncomplicated: Secondary | ICD-10-CM | POA: Diagnosis not present

## 2014-06-07 DIAGNOSIS — M5430 Sciatica, unspecified side: Secondary | ICD-10-CM | POA: Diagnosis not present

## 2014-06-07 DIAGNOSIS — Z791 Long term (current) use of non-steroidal anti-inflammatories (NSAID): Secondary | ICD-10-CM | POA: Insufficient documentation

## 2014-06-07 DIAGNOSIS — Y998 Other external cause status: Secondary | ICD-10-CM | POA: Diagnosis not present

## 2014-06-07 DIAGNOSIS — T148XXA Other injury of unspecified body region, initial encounter: Secondary | ICD-10-CM

## 2014-06-07 DIAGNOSIS — Z79899 Other long term (current) drug therapy: Secondary | ICD-10-CM | POA: Insufficient documentation

## 2014-06-07 DIAGNOSIS — S299XXA Unspecified injury of thorax, initial encounter: Secondary | ICD-10-CM | POA: Diagnosis present

## 2014-06-07 DIAGNOSIS — S0990XA Unspecified injury of head, initial encounter: Secondary | ICD-10-CM | POA: Insufficient documentation

## 2014-06-07 DIAGNOSIS — E119 Type 2 diabetes mellitus without complications: Secondary | ICD-10-CM | POA: Diagnosis not present

## 2014-06-07 DIAGNOSIS — Y92410 Unspecified street and highway as the place of occurrence of the external cause: Secondary | ICD-10-CM | POA: Insufficient documentation

## 2014-06-07 DIAGNOSIS — S161XXA Strain of muscle, fascia and tendon at neck level, initial encounter: Secondary | ICD-10-CM | POA: Diagnosis not present

## 2014-06-07 MED ORDER — IBUPROFEN 800 MG PO TABS: 800.0000 mg | ORAL_TABLET | Freq: Three times a day (TID) | ORAL | Status: DC

## 2014-06-07 MED ORDER — HYDROCODONE-ACETAMINOPHEN 5-325 MG PO TABS: 1.0000 | ORAL_TABLET | ORAL | Status: DC | PRN

## 2014-06-07 MED ORDER — CYCLOBENZAPRINE HCL 10 MG PO TABS: 10.0000 mg | ORAL_TABLET | Freq: Three times a day (TID) | ORAL | Status: DC | PRN
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
## 2014-08-29 ENCOUNTER — Encounter (HOSPITAL_COMMUNITY): Payer: Self-pay

## 2014-08-29 ENCOUNTER — Emergency Department (HOSPITAL_COMMUNITY)
Admission: EM | Admit: 2014-08-29 | Discharge: 2014-08-30 | Disposition: A | Discharge status: Home / Self Care | Payer: BLUE CROSS/BLUE SHIELD | Attending: Emergency Medicine | Admitting: Emergency Medicine

## 2014-08-29 DIAGNOSIS — M25552 Pain in left hip: Secondary | ICD-10-CM | POA: Diagnosis present

## 2014-08-29 DIAGNOSIS — J45909 Unspecified asthma, uncomplicated: Secondary | ICD-10-CM | POA: Diagnosis not present

## 2014-08-29 DIAGNOSIS — M5416 Radiculopathy, lumbar region: Secondary | ICD-10-CM | POA: Insufficient documentation

## 2014-08-29 DIAGNOSIS — G8929 Other chronic pain: Secondary | ICD-10-CM | POA: Insufficient documentation

## 2014-08-29 DIAGNOSIS — Z880 Allergy status to penicillin: Secondary | ICD-10-CM | POA: Diagnosis not present

## 2014-08-29 DIAGNOSIS — E119 Type 2 diabetes mellitus without complications: Secondary | ICD-10-CM | POA: Diagnosis not present

## 2014-08-29 DIAGNOSIS — Z791 Long term (current) use of non-steroidal anti-inflammatories (NSAID): Secondary | ICD-10-CM | POA: Insufficient documentation

## 2014-08-29 NOTE — ED Notes (Signed)
Pt complains of bilateral leg pain for several months from the hip down in her joints and her feet

## 2014-08-30 MED ORDER — TRAMADOL HCL 50 MG PO TABS: 50.0000 mg | ORAL_TABLET | Freq: Four times a day (QID) | ORAL | Status: DC | PRN

## 2014-08-30 MED ORDER — PREDNISONE 20 MG PO TABS: ORAL_TABLET | ORAL | Status: DC
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
## 2014-08-30 NOTE — Discharge Instructions (Signed)

## 2014-08-30 NOTE — ED Notes (Signed)
Patient c/o BLE pain for several months, patient states she has been taking OTC pain meds without relief. Patient also c/o toe pain to right great toe, nail appears to be ingrown.

## 2014-09-13 ENCOUNTER — Emergency Department (HOSPITAL_COMMUNITY): Payer: BLUE CROSS/BLUE SHIELD

## 2014-09-13 ENCOUNTER — Emergency Department (HOSPITAL_COMMUNITY)
Admission: EM | Admit: 2014-09-13 | Discharge: 2014-09-13 | Discharge status: Against Medical Advice | Payer: BLUE CROSS/BLUE SHIELD | Attending: Emergency Medicine | Admitting: Emergency Medicine

## 2014-09-13 ENCOUNTER — Encounter (HOSPITAL_COMMUNITY): Payer: Self-pay | Admitting: Emergency Medicine

## 2014-09-13 DIAGNOSIS — M79605 Pain in left leg: Secondary | ICD-10-CM | POA: Insufficient documentation

## 2014-09-13 DIAGNOSIS — G8929 Other chronic pain: Secondary | ICD-10-CM | POA: Insufficient documentation

## 2014-09-13 DIAGNOSIS — R079 Chest pain, unspecified: Secondary | ICD-10-CM | POA: Diagnosis present

## 2014-09-13 DIAGNOSIS — E119 Type 2 diabetes mellitus without complications: Secondary | ICD-10-CM | POA: Diagnosis not present

## 2014-09-13 DIAGNOSIS — J45901 Unspecified asthma with (acute) exacerbation: Secondary | ICD-10-CM | POA: Insufficient documentation

## 2014-09-13 DIAGNOSIS — M545 Low back pain: Secondary | ICD-10-CM | POA: Insufficient documentation

## 2014-09-13 NOTE — ED Notes (Signed)
Unable to locate patient x3.

## 2014-09-13 NOTE — ED Notes (Signed)
Pt sts she has had pain in her chest that started several days ago. Pt also has pain in lower back and L lower leg. Back pain increases with movement. CP is described as sharp. Pt has minimal SOB.

## 2014-09-13 NOTE — ED Notes (Signed)
Unable to locate patient x2.

## 2014-09-13 NOTE — ED Notes (Signed)
Pt waiting in lobby, called 2x for lab work - no answer.

## 2014-09-19 ENCOUNTER — Encounter (HOSPITAL_COMMUNITY): Payer: Self-pay | Admitting: Emergency Medicine

## 2014-09-19 ENCOUNTER — Emergency Department (HOSPITAL_COMMUNITY)
Admission: EM | Admit: 2014-09-19 | Discharge: 2014-09-19 | Disposition: A | Discharge status: Home / Self Care | Payer: BLUE CROSS/BLUE SHIELD | Attending: Emergency Medicine | Admitting: Emergency Medicine

## 2014-09-19 DIAGNOSIS — Z79899 Other long term (current) drug therapy: Secondary | ICD-10-CM | POA: Diagnosis not present

## 2014-09-19 DIAGNOSIS — G8929 Other chronic pain: Secondary | ICD-10-CM | POA: Insufficient documentation

## 2014-09-19 DIAGNOSIS — M79601 Pain in right arm: Secondary | ICD-10-CM

## 2014-09-19 DIAGNOSIS — K0889 Other specified disorders of teeth and supporting structures: Secondary | ICD-10-CM

## 2014-09-19 DIAGNOSIS — J45909 Unspecified asthma, uncomplicated: Secondary | ICD-10-CM | POA: Diagnosis not present

## 2014-09-19 DIAGNOSIS — E119 Type 2 diabetes mellitus without complications: Secondary | ICD-10-CM | POA: Insufficient documentation

## 2014-09-19 DIAGNOSIS — K088 Other specified disorders of teeth and supporting structures: Secondary | ICD-10-CM | POA: Insufficient documentation

## 2014-09-19 DIAGNOSIS — Z880 Allergy status to penicillin: Secondary | ICD-10-CM | POA: Diagnosis not present

## 2014-09-19 DIAGNOSIS — M79604 Pain in right leg: Secondary | ICD-10-CM | POA: Insufficient documentation

## 2014-09-19 MED ORDER — IBUPROFEN 800 MG PO TABS: 800.0000 mg | ORAL_TABLET | Freq: Three times a day (TID) | ORAL | Status: DC

## 2014-09-19 MED ORDER — PENICILLIN V POTASSIUM 500 MG PO TABS: 500.0000 mg | ORAL_TABLET | Freq: Four times a day (QID) | ORAL | Status: DC

## 2014-09-19 MED ORDER — CLINDAMYCIN HCL 300 MG PO CAPS: 300.0000 mg | ORAL_CAPSULE | Freq: Four times a day (QID) | ORAL | Status: DC

## 2014-09-19 NOTE — ED Notes (Signed)
PT last sen at [facility] for leg pain 08-29-14

## 2014-09-19 NOTE — ED Provider Notes (Signed)
CSN: [id number]     Arrival date & time 09/19/14  0824 History  This chart was scribed for non-physician practitioner, Comer Locket, PA-C, working with Forde Dandy, MD by Ladene Artist, ED Scribe. This patient was seen in room TR11C/TR11C and the patient's care was started at 10:10 AM.  Chief Complaint  Patient presents with  . Dental Pain   The history is provided by the patient. No language interpreter was used.   HPI Comments: [name] is a 44 y.o. female, with a h/o DM, who presents to the Emergency Department complaining of gradually worsening, constant left lower dental pain onset 2 days ago. Pt reports 8/10 pain is exacerbated with opening her mouth. She denies fever or drainage from the affected area. She has tried Aleve and brushing her teeth without relief. Pt does not have a dentist. No difficulties breathing or swallowing.  Pt also complains of chronic bilateral leg pain ongoing for months. She has an appointment with her PCP in September.   Past Medical History  Diagnosis Date  . Diabetes mellitus without complication   . Asthma   . Thyroid disease   . Chronic daily headache   . Chronic back pain   . Sciatica   . Chronic neck pain    Past Surgical History  Procedure Laterality Date  . Tubal ligation    . Cholecystectomy    . Cesarean section     Family History  Problem Relation Age of Onset  . Diabetes Mother   . Hypertension Mother   . Thyroid disease Mother    Social History  Substance Use Topics  . Smoking status: Never Smoker   . Smokeless tobacco: None  . Alcohol Use: No   OB History    Gravida Para Term Preterm AB TAB SAB Ectopic Multiple Living   6 4 3 1 2  2   3      Review of Systems  Constitutional: Negative for fever.  HENT: Positive for dental problem.   Musculoskeletal: Positive for myalgias.  All other systems reviewed and are negative.  Allergies  Bactrim and Penicillins  Home Medications   Prior to Admission medications    Medication Sig Start Date End Date Taking? Authorizing Provider  acetaminophen (TYLENOL) 500 MG tablet Take 1,000 mg by mouth every 6 (six) hours as needed for moderate pain.    Historical Provider, MD  albuterol (PROVENTIL HFA;VENTOLIN HFA) 108 (90 BASE) MCG/ACT inhaler Inhale 2 puffs into the lungs every 6 (six) hours as needed for wheezing or shortness of breath.    Historical Provider, MD  clindamycin (CLEOCIN) 300 MG capsule Take 1 capsule (300 mg total) by mouth 4 (four) times daily. X 7 days 09/19/14   Comer Locket, PA-C  ibuprofen (ADVIL,MOTRIN) 800 MG tablet Take 1 tablet (800 mg total) by mouth 3 (three) times daily. 09/19/14   Comer Locket, PA-C  naproxen (NAPROSYN) 250 MG tablet Take 1 tablet (250 mg total) by mouth 2 (two) times daily as needed for mild pain or moderate pain (take with food). 12/29/13   Francine Graven, DO  predniSONE (DELTASONE) 20 MG tablet 3 tabs po daily x 3 days, then 2 tabs x 3 days, then 1.5 tabs x 3 days, then 1 tab x 3 days, then 0.5 tabs x 3 days 08/30/14   [name], MD  traMADol (ULTRAM) 50 MG tablet Take 1 tablet (50 mg total) by mouth every 6 (six) hours as needed. 08/30/14   [name], MD  BP 142/77 mmHg  Pulse 65  Temp(Src) 98 F (36.7 C) (Oral)  Resp 19  SpO2 99%  LMP 09/11/2014 Physical Exam  Constitutional: She is oriented to person, place, and time. She appears well-developed and well-nourished. No distress.  HENT:  Head: Normocephalic and atraumatic.  Mouth/Throat: No trismus in the jaw.  Discomfort located to left mandibular canine. Overall poor dentition. Multiple missing teeth with active caries. Mucous membranes are moist. No unilateral tonsillar swelling, uvula midline, no glossal swelling or elevation. No trismus. No fluctuance or evidence of a drainable abscess. No other evidence of emergent infection, Retropharyngeal or Peritonsillar abscess, Ludwig or Vincents angina. Tolerating secretions well. Patent  airway   Eyes: Conjunctivae and EOM are normal.  Neck: Neck supple. No tracheal deviation present.  Cardiovascular: Normal rate.   Pulmonary/Chest: Effort normal. No respiratory distress.  Musculoskeletal: Normal range of motion.  Neurological: She is alert and oriented to person, place, and time.  Skin: Skin is warm and dry.  Psychiatric: She has a normal mood and affect. Her behavior is normal.  Nursing note and vitals reviewed.  ED Course  Procedures (including critical care time) DIAGNOSTIC STUDIES: Oxygen Saturation is 99% on RA, normal by my interpretation.    COORDINATION OF CARE: 10:14 AM-Discussed treatment plan which includes Clindamycin, 800 mg ibuprofen and dental resources with pt at bedside and pt agreed to plan.   Labs Review Labs Reviewed - No data to display  Imaging Review No results found. I have personally reviewed and evaluated these images and lab results as part of my medical decision-making.   EKG Interpretation None     Meds given in ED:  Medications - No data to display  Discharge Medication List as of 09/19/2014 10:31 AM    START taking these medications   Details  penicillin v potassium (VEETID) 500 MG tablet Take 1 tablet (500 mg total) by mouth 4 (four) times daily., Starting 09/19/2014, Until Mon 09/26/14, Print       Filed Vitals:   09/19/14 0856 09/19/14 1045  BP: 142/77 142/75  Pulse: 65 65  Temp: 98 F (36.7 C) 98.8 F (37.1 C)  TempSrc: Oral Oral  Resp: 19 16  SpO2: 99% 100%    MDM  Vitals stable - WNL -afebrile Pt resting comfortably in ED. PE--physical exam as above. Overall poor dentition. No evidence of peritonsillar or retropharyngeal abscess. No evidence of Ludwig's angina. No trismus or glossal elevation. Due to patient's penicillin allergy, will DC with clindamycin. Given outpatient referral to dentistry on call as well as other outpatient dentistry resources.  Patient reports having a primary care appointment on  September 20 and she will be able to evaluate her chronic leg pain.  I discussed all relevant lab findings and imaging results with pt and they verbalized understanding. Discussed f/u with PCP within 48 hrs and return precautions, pt very amenable to plan.  Final diagnoses:  Pain, dental  Chronic leg pain, right    I personally performed the services described in this documentation, which was scribed in my presence. The recorded information has been reviewed and is accurate.     Comer Locket, PA-C 09/19/14 [place] Liu, MD 09/19/14 [phone]

## 2014-09-19 NOTE — Discharge Instructions (Signed)
You were evaluated today for your dental pain. There is not clearly an emergent cause your symptoms at this time. It is important to take your antibiotics as prescribed. You will also need to follow-up with dentistry for definitive care. You may take your Motrin as needed for dental pain. Do not take this medication in combination with other Advil, naproxen or ibuprofen medications. Return to ED for worsening symptoms.  Dental Pain A tooth ache may be caused by cavities (tooth decay). Cavities expose the nerve of the tooth to air and hot or cold temperatures. It may come from an infection or abscess (also called a boil or furuncle) around your tooth. It is also often caused by dental caries (tooth decay). This causes the pain you are having. DIAGNOSIS  Your caregiver can diagnose this problem by exam. TREATMENT   If caused by an infection, it may be treated with medications which kill germs (antibiotics) and pain medications as prescribed by your caregiver. Take medications as directed.  Only take over-the-counter or prescription medicines for pain, discomfort, or fever as directed by your caregiver.  Whether the tooth ache today is caused by infection or dental disease, you should see your dentist as soon as possible for further care. SEEK MEDICAL CARE IF: The exam and treatment you received today has been provided on an emergency basis only. This is not a substitute for complete medical or dental care. If your problem worsens or new problems (symptoms) appear, and you are unable to meet with your dentist, call or return to this location. SEEK IMMEDIATE MEDICAL CARE IF:   You have a fever.  You develop redness and swelling of your face, jaw, or neck.  You are unable to open your mouth.  You have severe pain uncontrolled by pain medicine. MAKE SURE YOU:   Understand these instructions.  Will watch your condition.  Will get help right away if you are not doing well or get  worse. Document Released: 01/14/2005 Document Revised: 04/08/2011 Document Reviewed: 09/02/2007 [facility] Patient Information 2015 [place], [place]. This information is not intended to replace advice given to you by your health care provider. Make sure you discuss any questions you have with your health care provider.

## 2014-09-19 NOTE — ED Notes (Signed)
Declined W/C at D/C and was escorted to lobby by RN. 

## 2014-09-19 NOTE — ED Notes (Signed)
Pt reports that she has tooth pain and left leg pain for months. Has been dx with neuropathy. Lower left bottom tooth pain and headache.

## 2014-09-19 NOTE — ED Notes (Signed)
PT reports pain to bil. Legs on going for years. Pt reports she now has numbness in Rt foot.

## 2014-11-10 ENCOUNTER — Ambulatory Visit (INDEPENDENT_AMBULATORY_CARE_PROVIDER_SITE_OTHER): Payer: BLUE CROSS/BLUE SHIELD | Admitting: Family Medicine

## 2014-11-10 ENCOUNTER — Other Ambulatory Visit: Payer: Self-pay | Admitting: Family Medicine

## 2014-11-10 VITALS — BP 124/76 | HR 82 | Temp 98.3°F | Resp 17 | Ht 61.0 in | Wt 205.0 lb

## 2014-11-10 DIAGNOSIS — M545 Low back pain: Secondary | ICD-10-CM | POA: Diagnosis not present

## 2014-11-10 DIAGNOSIS — R5382 Chronic fatigue, unspecified: Secondary | ICD-10-CM | POA: Diagnosis not present

## 2014-11-10 DIAGNOSIS — E039 Hypothyroidism, unspecified: Secondary | ICD-10-CM

## 2014-11-10 DIAGNOSIS — R609 Edema, unspecified: Secondary | ICD-10-CM | POA: Diagnosis not present

## 2014-11-10 DIAGNOSIS — N920 Excessive and frequent menstruation with regular cycle: Secondary | ICD-10-CM

## 2014-11-10 DIAGNOSIS — R202 Paresthesia of skin: Secondary | ICD-10-CM

## 2014-11-10 LAB — THYROID PANEL WITH TSH
Free Thyroxine Index: 3.1 (ref 1.4–3.8)
T3 Uptake: 22 % (ref 22–35)
T4, Total: 14 ug/dL — ABNORMAL HIGH (ref 4.5–12.0)
TSH: 4.748 u[IU]/mL — ABNORMAL HIGH (ref 0.350–4.500)

## 2014-11-10 LAB — POCT CBC
Granulocyte percent: 57.7 %G (ref 37–80)
HCT, POC: 35.3 % — AB (ref 37.7–47.9)
Hemoglobin: 11.8 g/dL — AB (ref 12.2–16.2)
Lymph, poc: 2.5 (ref 0.6–3.4)
MCH, POC: 29 pg (ref 27–31.2)
MCHC: 33.4 g/dL (ref 31.8–35.4)
MCV: 86.6 fL (ref 80–97)
MID (cbc): 0.5 (ref 0–0.9)
MPV: 6.7 fL (ref 0–99.8)
POC Granulocyte: 4.2 (ref 2–6.9)
POC LYMPH PERCENT: 34.9 %L (ref 10–50)
POC MID %: 7.4 %M (ref 0–12)
Platelet Count, POC: 386 10*3/uL (ref 142–424)
RBC: 4.08 M/uL (ref 4.04–5.48)
RDW, POC: 14.2 %
WBC: 7.2 10*3/uL (ref 4.6–10.2)

## 2014-11-10 LAB — COMPLETE METABOLIC PANEL WITH GFR
ALT: 13 U/L (ref 6–29)
AST: 17 U/L (ref 10–35)
Albumin: 3.9 g/dL (ref 3.6–5.1)
Alkaline Phosphatase: 73 U/L (ref 33–115)
BUN: 17 mg/dL (ref 7–25)
CO2: 26 mmol/L (ref 20–31)
Calcium: 9.2 mg/dL (ref 8.6–10.2)
Chloride: 103 mmol/L (ref 98–110)
Creat: 0.61 mg/dL (ref 0.50–1.10)
GFR, Est African American: >89 mL/min (ref >=60)
GFR, Est Non African American: >89 mL/min (ref >=60)
Glucose, Bld: 120 mg/dL — ABNORMAL HIGH (ref 65–99)
Potassium: 4.8 mmol/L (ref 3.5–5.3)
Sodium: 137 mmol/L (ref 135–146)
Total Bilirubin: 0.4 mg/dL (ref 0.2–1.2)
Total Protein: 7.4 g/dL (ref 6.1–8.1)

## 2014-11-10 LAB — POCT GLYCOSYLATED HEMOGLOBIN (HGB A1C): Hemoglobin A1C: 5.8

## 2014-11-10 LAB — POCT SEDIMENTATION RATE: POCT SED RATE: 49 mm/hr — AB (ref 0–22)

## 2014-11-10 LAB — GLUCOSE, POCT (MANUAL RESULT ENTRY): POC Glucose: 111 mg/dl — AB (ref 70–99)

## 2014-11-10 LAB — HEMOGLOBIN A1C: Hgb A1c MFr Bld: 5.8 % (ref 4.0–6.0)

## 2014-11-10 MED ORDER — LEVOTHYROXINE SODIUM 50 MCG PO TABS: 50.0000 ug | ORAL_TABLET | Freq: Every day | ORAL | Status: DC

## 2014-11-10 NOTE — Patient Instructions (Signed)
We are waiting for the laboratory results.  We will obtain you come back here instead of going to the emergency room. He'll see be waiting and it will save lots of money.  I want to continue looking into your problem area if the problem is the thyroid, we can put you on medication to help relieve many of your symptoms.   Hypothyroidism Hypothyroidism is a disorder of the thyroid. The thyroid is a large gland that is located in the lower front of the neck. The thyroid releases hormones that control how the body works. With hypothyroidism, the thyroid does not make enough of these hormones. CAUSES Causes of hypothyroidism may include:  Viral infections.  Pregnancy.  Your own defense system (immune system) attacking your thyroid.  Certain medicines.  Birth defects.  Past radiation treatments to your head or neck.  Past treatment with radioactive iodine.  Past surgical removal of part or all of your thyroid.  Problems with the gland that is located in the center of your brain (pituitary). SIGNS AND SYMPTOMS Signs and symptoms of hypothyroidism may include:  Feeling as though you have no energy (lethargy).  Inability to tolerate cold.  Weight gain that is not explained by a change in diet or exercise habits.  Dry skin.  Coarse hair.  Menstrual irregularity.  Slowing of thought processes.  Constipation.  Sadness or depression. DIAGNOSIS  Your health care provider may diagnose hypothyroidism with blood tests and ultrasound tests. TREATMENT Hypothyroidism is treated with medicine that replaces the hormones that your body does not make. After you begin treatment, it may take several weeks for symptoms to go away. HOME CARE INSTRUCTIONS   Take medicines only as directed by your health care provider.  If you start taking any new medicines, tell your health care provider.  Keep all follow-up visits as directed by your health care provider. This is important. As your  condition improves, your dosage needs may change. You will need to have blood tests regularly so that your health care provider can watch your condition. SEEK MEDICAL CARE IF:  Your symptoms do not get better with treatment.  You are taking thyroid replacement medicine and:  You sweat excessively.  You have tremors.  You feel anxious.  You lose weight rapidly.  You cannot tolerate heat.  You have emotional swings.  You have diarrhea.  You feel weak. SEEK IMMEDIATE MEDICAL CARE IF:   You develop chest pain.  You develop an irregular heartbeat.  You develop a rapid heartbeat.   This information is not intended to replace advice given to you by your health care provider. Make sure you discuss any questions you have with your health care provider.   Document Released: 01/14/2005 Document Revised: 02/04/2014 Document Reviewed: 06/01/2013 Elsevier Interactive Patient Education [employer].
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
## 2014-11-14 ENCOUNTER — Ambulatory Visit (INDEPENDENT_AMBULATORY_CARE_PROVIDER_SITE_OTHER): Payer: BLUE CROSS/BLUE SHIELD | Admitting: Physician Assistant

## 2014-11-14 VITALS — BP 120/74 | HR 66 | Temp 98.0°F | Resp 18 | Ht 61.0 in | Wt 205.8 lb

## 2014-11-14 DIAGNOSIS — N921 Excessive and frequent menstruation with irregular cycle: Secondary | ICD-10-CM | POA: Diagnosis not present

## 2014-11-14 LAB — POCT CBC
Granulocyte percent: 60 %G (ref 37–80)
HEMATOCRIT: 36.1 % — AB (ref 37.7–47.9)
HEMOGLOBIN: 12 g/dL — AB (ref 12.2–16.2)
LYMPH, POC: 2.9 (ref 0.6–3.4)
MCH, POC: 29.2 pg (ref 27–31.2)
MCHC: 33.3 g/dL (ref 31.8–35.4)
MCV: 87.6 fL (ref 80–97)
MID (cbc): 0.3 (ref 0–0.9)
MPV: 6.8 fL (ref 0–99.8)
POC GRANULOCYTE: 4.7 (ref 2–6.9)
POC LYMPH PERCENT: 36.4 %L (ref 10–50)
POC MID %: 3.6 % (ref 0–12)
Platelet Count, POC: 392 10*3/uL (ref 142–424)
RBC: 4.12 M/uL (ref 4.04–5.48)
RDW, POC: 14.2 %
WBC: 7.9 10*3/uL (ref 4.6–10.2)

## 2014-11-14 MED ORDER — TRAMADOL HCL 50 MG PO TABS: 50.0000 mg | ORAL_TABLET | Freq: Three times a day (TID) | ORAL | Status: DC | PRN

## 2014-11-14 NOTE — Progress Notes (Signed)
   Subjective:    Patient ID: [name], female    DOB: 1970/03/13, 44 y.o.   MRN: [id number]  HPI Patient presents for "excessive bleeding" for the past 2 weeks. For the past year cycles have been irregular and this is the second time that cycle has lasted this long and had this much bleeding. Last times was given some pills to take at [facility] clinic that she is not sure of their name to make bleeding stop. Says that pills were not birth control bc BCP always makes her stomach hurt. Changes pad every 30 minutes- 1 hour and pads are soaked when changes. Has had a lot of clots as well. Adds that has been having lower right crampy abdominal pain as well. Pain does not radiate,but sometimes makes her feel like she can't walk. Was evaluated for pain in ER with [place] and was told everything was normal. Has taken ibuprofen with marginal help for pain. Recently started synthyroid 3 days ago for hypothyroidism. Denies change in skin textures, polyuria/phagia, sweating, diarrhea, constipation, N/V, fever, vaginal discharge, dysuria. Endorses fatigue. Does not recall last pap smear.   Review of Systems As noted above.    Objective:   Physical Exam  Constitutional: She is oriented to person, place, and time. She appears well-developed and well-nourished. No distress.  Blood pressure 120/74, pulse 66, temperature 98 F (36.7 C), temperature source Oral, resp. rate 18, height 5\' 1"  (1.549 m), weight 205 lb 12.8 oz (93.35 kg), last menstrual period 11/10/2014, SpO2 98 %.   HENT:  Head: Normocephalic and atraumatic.  Right Ear: External ear normal.  Left Ear: External ear normal.  Eyes: Conjunctivae are normal. Right eye exhibits no discharge. Left eye exhibits no discharge. No scleral icterus.  Neck: Neck supple. No thyromegaly present.  Cardiovascular: Normal rate, regular rhythm, normal heart sounds and intact distal pulses.  Exam reveals no gallop and no friction rub.   No murmur heard. Pulmonary/Chest:  Effort normal and breath sounds normal. No respiratory distress. She has no wheezes. She has no rales. She exhibits no tenderness.  Abdominal: Soft. Bowel sounds are normal. She exhibits no distension. There is no hepatosplenomegaly. There is tenderness in the right lower quadrant and left lower quadrant. There is no rebound, no guarding and no CVA tenderness. No hernia.  Lymphadenopathy:    She has no cervical adenopathy.  Neurological: She is alert and oriented to person, place, and time.  Skin: Skin is warm and dry. No rash noted. She is not diaphoretic. No erythema. No pallor.  Psychiatric: She has a normal mood and affect. Her behavior is normal. Judgment and thought content normal.       Assessment & Plan:  1. Menorrhagia with irregular cycle Ultram prn for pain.  - POCT CBC - Ambulatory referral to Obstetrics / Gynecology   Alveta Heimlich PA-C  [facility] 11/14/2014 6:19 PM

## 2014-11-15 ENCOUNTER — Telehealth: Payer: Self-pay

## 2014-11-15 NOTE — Telephone Encounter (Signed)
Patient called to ask about an ultrasound that was supposed to be ordered after her OV yesterday with [employer].  The referral for an OB-GYN was placed, but I do not seen an order for transvaginal and pelvic ultrasounds.  Please advise, thank you.  CB#: [phone]

## 2014-11-16 ENCOUNTER — Telehealth: Payer: Self-pay

## 2014-11-16 NOTE — Progress Notes (Signed)
  Medical screening examination/treatment/procedure(s) were performed by non-physician practitioner and as supervising physician I was immediately available for consultation/collaboration.     

## 2014-11-16 NOTE — Telephone Encounter (Signed)
Left vmail. Per our conversation in office put in referral to OB/GYN and they will perform [place].

## 2014-11-16 NOTE — Telephone Encounter (Signed)
Patient is calling to follow up on referral. She states that she can't wait forever and she might have to go to [facility] hospital.

## 2014-11-18 ENCOUNTER — Encounter: Payer: Self-pay | Admitting: Family Medicine

## 2014-11-22 ENCOUNTER — Telehealth: Payer: Self-pay | Admitting: *Deleted

## 2014-11-22 ENCOUNTER — Telehealth: Payer: Self-pay

## 2014-11-22 NOTE — Telephone Encounter (Signed)
Pt has questions about her thyroid meds that dr Joseph Art put her on last week  Best number [phone]

## 2014-11-22 NOTE — Telephone Encounter (Signed)
Pt left message stating that she has an appt scheduled on 12/08/14. She has been having heavy bleeding x1 month and also having severe pain on the lower left side. She feels her problem has become worse and requests a sooner appt. I called pt and offered appt on 10/27 @ 1415.  Pt agreed to appt. She was reminded to bring her insurance card with her to the appt. Pt voiced understanding of all information given.

## 2014-11-23 NOTE — Telephone Encounter (Signed)
Left message for pt to call back  °

## 2014-11-23 NOTE — Telephone Encounter (Signed)
Spoke with pt, she states her Levothyroxine gives her diarrhea. I advised her that this is not a typical side effect and maybe she was fighting an illness off and it was a coincedence that this happened. I advised her to take the medication and see if she has these symptoms and if she does then to call and let me know. Pt agreed.

## 2014-11-24 ENCOUNTER — Encounter: Payer: Self-pay | Admitting: Medical

## 2014-11-24 ENCOUNTER — Ambulatory Visit (INDEPENDENT_AMBULATORY_CARE_PROVIDER_SITE_OTHER): Payer: BLUE CROSS/BLUE SHIELD | Admitting: Medical

## 2014-11-24 VITALS — BP 131/82 | HR 78 | Ht 62.0 in | Wt 206.7 lb

## 2014-11-24 DIAGNOSIS — N921 Excessive and frequent menstruation with irregular cycle: Secondary | ICD-10-CM

## 2014-11-24 DIAGNOSIS — E039 Hypothyroidism, unspecified: Secondary | ICD-10-CM | POA: Insufficient documentation

## 2014-11-24 NOTE — Patient Instructions (Signed)
Endometrial Biopsy Endometrial biopsy is a procedure in which a tissue sample is taken from inside the uterus. The tissue sample is then looked at under a microscope to see if the tissue is normal or abnormal. The endometrium is the lining of the uterus. This procedure helps determine where you are in your menstrual cycle and how hormone levels are affecting the lining of the uterus. This procedure may also be used to evaluate uterine bleeding or to diagnose endometrial cancer, tuberculosis, polyps, or inflammatory conditions.  LET [facility] CARE PROVIDER KNOW ABOUT:  Any allergies you have.  All medicines you are taking, including vitamins, herbs, eye drops, creams, and over-the-counter medicines.  Previous problems you or members of your family have had with the use of anesthetics.  Any blood disorders you have.  Previous surgeries you have had.  Medical conditions you have.  Possibility of pregnancy. RISKS AND COMPLICATIONS Generally, this is a safe procedure. However, as with any procedure, complications can occur. Possible complications include:  Bleeding.  Pelvic infection.  Puncture of the uterine wall with the biopsy device (rare). BEFORE THE PROCEDURE   Keep a record of your menstrual cycles as directed by your health care provider. You may need to schedule your procedure for a specific time in your cycle.  You may want to bring a sanitary pad to wear home after the procedure.  Arrange for someone to drive you home after the procedure if you will be given a medicine to help you relax (sedative). PROCEDURE   You may be given a sedative to relax you.  You will lie on an exam table with your feet and legs supported as in a pelvic exam.  Your health care provider will insert an instrument (speculum) into your vagina to see your cervix.  Your cervix will be cleansed with an antiseptic solution. A medicine (local anesthetic) will be used to numb the cervix.  A forceps  instrument (tenaculum) will be used to hold your cervix steady for the biopsy.  A thin, rodlike instrument (uterine sound) will be inserted through your cervix to determine the length of your uterus and the location where the biopsy sample will be removed.  A thin, flexible tube (catheter) will be inserted through your cervix and into the uterus. The catheter is used to collect the biopsy sample from your endometrial tissue.  The catheter and speculum will then be removed, and the tissue sample will be sent to a lab for examination. AFTER THE PROCEDURE  You will rest in a recovery area until you are ready to go home.  You may have mild cramping and a small amount of vaginal bleeding for a few days after the procedure. This is normal.  Make sure you find out how to get your test results.   This information is not intended to replace advice given to you by your health care provider. Make sure you discuss any questions you have with your health care provider.   Document Released: 05/17/2004 Document Revised: 09/16/2012 Document Reviewed: 07/01/2012 Elsevier Interactive Patient Education 2016 Elsevier Inc. Dysfunctional Uterine Bleeding Dysfunctional uterine bleeding is abnormal bleeding from the uterus. Dysfunctional uterine bleeding includes:  A period that comes earlier or later than usual.  A period that is lighter, heavier, or has blood clots.  Bleeding between periods.  Skipping one or more periods.  Bleeding after sexual intercourse.  Bleeding after menopause. HOME CARE INSTRUCTIONS  Pay attention to any changes in your symptoms. Follow these instructions to help  with your condition: Eating  Eat well-balanced meals. Include foods that are high in iron, such as liver, meat, shellfish, green leafy vegetables, and eggs.  If you become constipated:  Drink plenty of water.  Eat fruits and vegetables that are high in water and fiber, such as spinach, carrots, raspberries,  apples, and mango. Medicines  Take over-the-counter and prescription medicines only as told by your health care provider.  Do not change medicines without talking with your health care provider.  Aspirin or medicines that contain aspirin may make the bleeding worse. Do not take those medicines:  During the week before your period.  During your period.  If you were prescribed iron pills, take them as told by your health care provider. Iron pills help to replace iron that your body loses because of this condition. Activity  If you need to change your sanitary pad or tampon more than one time every 2 hours:  Lie in bed with your feet raised (elevated).  Place a cold pack on your lower abdomen.  Rest as much as possible until the bleeding stops or slows down.  Do not try to lose weight until the bleeding has stopped and your blood iron level is back to normal. Other Instructions  For two months, write down:  When your period starts.  When your period ends.  When any abnormal bleeding occurs.  What problems you notice.  Keep all follow up visits as told by your health care provider. This is important. SEEK MEDICAL CARE IF:  You get light-headed or weak.  You have nausea and vomiting.  You cannot eat or drink without vomiting.  You feel dizzy or have diarrhea while you are taking medicines.  You are taking birth control pills or hormones, and you want to change them or stop taking them. SEEK IMMEDIATE MEDICAL CARE IF:  You develop a fever or chills.  You need to change your sanitary pad or tampon more than one time per hour.  Your bleeding becomes heavier, or your flow contains clots more often.  You develop pain in your abdomen.  You lose consciousness.  You develop a rash.   This information is not intended to replace advice given to you by your health care provider. Make sure you discuss any questions you have with your health care provider.   Document  Released: 01/12/2000 Document Revised: 10/05/2014 Document Reviewed: 04/11/2014 Elsevier Interactive Patient Education [employer].

## 2014-11-24 NOTE — Progress Notes (Signed)
Patient ID: [name], female   DOB: 07/16/1970, 44 y.o.   MRN: [id number]  History:  [name] is a 44 y.o. [id number] who presents to clinic today for abnormal uterine bleeding. The patient states that the last episode of bleeding she had lasted 4 weeks. She states that she stopped bleeding last Saturday and denies bleeding today. She does state she had at least 2 regular periods prior to that episode of bleeding although one was lighter than her normal periods. She does endorse monthly cycles prior to this prolonged episode, although the true regularity is unclear. The patient also states severe LLQ abdominal pain that is usually present during her periods although is also present today. She endorses recent loose stools since starting on Synthroid and a prior history of constipation before taking Synthroid. She also endorses fatigue and occasional SOB and denies weakness or dizziness today. She had a recent CBC with a Hgb of 12.0 last week.   The following portions of the patient's history were reviewed and updated as appropriate: allergies, current medications, family history, past medical history, social history, past surgical history and problem list.  Review of Systems:  Other than those mentioned in HPI all ROS negative  Objective:  Physical Exam BP 131/82 mmHg  Pulse 78  Ht 5\' 2"  (1.575 m)  Wt 206 lb 11.2 oz (93.759 kg)  BMI 37.80 kg/m2  LMP 11/10/2014 Physical Exam  Constitutional: She is oriented to person, place, and time. She appears well-developed and well-nourished. No distress.  HENT:  Head: Normocephalic.  Cardiovascular: Normal rate.   Pulmonary/Chest: Effort normal.  Abdominal: Soft. She exhibits no distension. There is no tenderness.  Genitourinary: There is no rash or tenderness on the right labia. There is no rash or tenderness on the left labia. Uterus is not enlarged and not tender. Cervix exhibits no motion tenderness. Right adnexum displays no mass and no  tenderness. Left adnexum displays no mass and no tenderness.  Neurological: She is alert and oriented to person, place, and time.  Skin: Skin is warm and dry. No erythema.  Psychiatric: She has a normal mood and affect.  Vitals reviewed.  MDM Discussed endometrial biopsy with patient. She declines the procedure today. I have explained the importance of the test to rule out atypia and/or carcinoma as a source of her bleeding. She again states that she would prefer to wait and see if her periods become regular once her thyroid function is normalized with Synthroid.  Based on my physical exam today the LLQ abdominal pain is not in the adnexal region and is more likely related to her change in bowel habits with Synthroid and previously due to chronic constipation.  Assessment & Plan:  Assessment: Abnormal uterine bleeding Hypothyroidism  Plans: Strongly recommended endometrial biopsy today given AUB and patient's age. Patient declines at this time.  Patient to return to [facility] when she is prepared to have endometrial biopsy performed or sooner if symptoms were to change or worsen  Luvenia Redden, PA-C 11/24/2014 2:44 PM

## 2014-11-25 ENCOUNTER — Encounter: Payer: Self-pay | Admitting: Family Medicine

## 2014-12-08 ENCOUNTER — Ambulatory Visit (INDEPENDENT_AMBULATORY_CARE_PROVIDER_SITE_OTHER): Payer: BLUE CROSS/BLUE SHIELD

## 2014-12-08 ENCOUNTER — Encounter: Payer: BLUE CROSS/BLUE SHIELD | Admitting: Family Medicine

## 2014-12-08 ENCOUNTER — Ambulatory Visit (INDEPENDENT_AMBULATORY_CARE_PROVIDER_SITE_OTHER): Payer: BLUE CROSS/BLUE SHIELD | Admitting: Family Medicine

## 2014-12-08 VITALS — BP 122/72 | HR 76 | Temp 98.0°F | Resp 17 | Ht 61.5 in | Wt 208.0 lb

## 2014-12-08 DIAGNOSIS — E038 Other specified hypothyroidism: Secondary | ICD-10-CM

## 2014-12-08 DIAGNOSIS — R1032 Left lower quadrant pain: Secondary | ICD-10-CM

## 2014-12-08 DIAGNOSIS — N939 Abnormal uterine and vaginal bleeding, unspecified: Secondary | ICD-10-CM | POA: Diagnosis not present

## 2014-12-08 DIAGNOSIS — N390 Urinary tract infection, site not specified: Secondary | ICD-10-CM

## 2014-12-08 DIAGNOSIS — R8281 Pyuria: Secondary | ICD-10-CM

## 2014-12-08 LAB — COMPLETE METABOLIC PANEL WITH GFR
ALT: 8 U/L (ref 6–29)
AST: 12 U/L (ref 10–35)
Albumin: 3.5 g/dL — ABNORMAL LOW (ref 3.6–5.1)
Alkaline Phosphatase: 71 U/L (ref 33–115)
BUN: 15 mg/dL (ref 7–25)
CO2: 24 mmol/L (ref 20–31)
Calcium: 8.6 mg/dL (ref 8.6–10.2)
Chloride: 103 mmol/L (ref 98–110)
Creat: 0.54 mg/dL (ref 0.50–1.10)
GFR, Est African American: >89 mL/min (ref >=60)
GFR, Est Non African American: >89 mL/min (ref >=60)
Glucose, Bld: 112 mg/dL — ABNORMAL HIGH (ref 65–99)
Potassium: 4.5 mmol/L (ref 3.5–5.3)
Sodium: 138 mmol/L (ref 135–146)
Total Bilirubin: 0.3 mg/dL (ref 0.2–1.2)
Total Protein: 6.8 g/dL (ref 6.1–8.1)

## 2014-12-08 LAB — POCT URINALYSIS DIP (MANUAL ENTRY)
Bilirubin, UA: NEGATIVE
Glucose, UA: NEGATIVE
Ketones, POC UA: NEGATIVE
Nitrite, UA: NEGATIVE
Protein Ur, POC: NEGATIVE
Spec Grav, UA: 1.025
Urobilinogen, UA: 0.2
pH, UA: 6

## 2014-12-08 LAB — POC MICROSCOPIC URINALYSIS (UMFC): Mucus: ABSENT

## 2014-12-08 LAB — POCT CBC
Granulocyte percent: 56.5 %G (ref 37–80)
HCT, POC: 32.6 % — AB (ref 37.7–47.9)
Hemoglobin: 11 g/dL — AB (ref 12.2–16.2)
Lymph, poc: 2.6 (ref 0.6–3.4)
MCH, POC: 28.8 pg (ref 27–31.2)
MCHC: 33.8 g/dL (ref 31.8–35.4)
MCV: 85.3 fL (ref 80–97)
MID (cbc): 0.6 (ref 0–0.9)
MPV: 6.5 fL (ref 0–99.8)
POC Granulocyte: 4.1 (ref 2–6.9)
POC LYMPH PERCENT: 35.3 %L (ref 10–50)
POC MID %: 8.2 %M (ref 0–12)
Platelet Count, POC: 376 10*3/uL (ref 142–424)
RBC: 3.83 M/uL — AB (ref 4.04–5.48)
RDW, POC: 13.2 %
WBC: 7.3 10*3/uL (ref 4.6–10.2)

## 2014-12-08 LAB — THYROID PANEL WITH TSH
Free Thyroxine Index: 2.9 (ref 1.4–3.8)
T3 Uptake: 21 % — ABNORMAL LOW (ref 22–35)
T4, Total: 13.9 ug/dL — ABNORMAL HIGH (ref 4.5–12.0)
TSH: 2.963 u[IU]/mL (ref 0.350–4.500)

## 2014-12-08 MED ORDER — CIPROFLOXACIN HCL 500 MG PO TABS: 500.0000 mg | ORAL_TABLET | Freq: Two times a day (BID) | ORAL | Status: DC

## 2014-12-08 NOTE — Patient Instructions (Addendum)
I am referring you to gynecology because of the irregular, abnormal bleeding  You can pick up compression stockings at elastics therapy: [phone].  Measurements:  Ankle 13", calf 19.5", thigh 25"  Open or closed toe  Color:  Beige, black or white  Compression:  20-30 (mediume)

## 2014-12-08 NOTE — Progress Notes (Addendum)
This chart was scribed for Robyn Haber, MD by Moises Blood, medical scribe at [facility].The patient was seen in exam room 9 and the patient's care was started at 8:55 AM.  Patient ID: [name] MRN: [id number], DOB: [birth year], 44 y.o. Date of Encounter: 12/08/2014  Primary Physician: No PCP Per Patient  Chief Complaint:  Chief Complaint  Patient presents with   Leg Swelling   Flank Pain    left side     HPI:  [name] is a 44 y.o. female who presents to [facility] complaining of legs still swelling.  She went to the [facility] hospital and they want to do a biopsy. They note that it seems more like an abdominal issue.  Her period is starting right now. Her last period was back when she went to [facility] hospital.   She also notes having dull, left flank pain. She informs that she usually has this when her cycle starts.   She works at a [employer] and stands all day. She was recommended support stockings for her legs.   Past Medical History  Diagnosis Date   Diabetes mellitus without complication (HCC)    Asthma    Thyroid disease    Chronic daily headache    Chronic back pain    Sciatica    Chronic neck pain      Home Meds: Prior to Admission medications   Medication Sig Start Date End Date Taking? Authorizing Provider  acetaminophen (TYLENOL) 500 MG tablet Take 1,000 mg by mouth every 6 (six) hours as needed for moderate pain.    Historical Provider, MD  levothyroxine (SYNTHROID, LEVOTHROID) 50 MCG tablet Take 1 tablet (50 mcg total) by mouth daily. 11/10/14   Robyn Haber, MD  traMADol (ULTRAM) 50 MG tablet Take 1 tablet (50 mg total) by mouth every 8 (eight) hours as needed. Patient not taking: Reported on 11/24/2014 11/14/14   Tishira R Brewington, PA-C    Allergies:  Allergies  Allergen Reactions   Bactrim [Sulfamethoxazole-Trimethoprim] Hives   Penicillins Other (See Comments)    "when I pee it hurts"     Social History   Social History   Marital Status: Married    Spouse Name: N/A   Number of Children: N/A   Years of Education: N/A   Occupational History   Not on file.   Social History Main Topics   Smoking status: Never Smoker    Smokeless tobacco: Not on file   Alcohol Use: No   Drug Use: No   Sexual Activity: Not on file   Other Topics Concern   Not on file   Social History Narrative     Review of Systems: Constitutional: negative for fever, chills, night sweats, weight changes, or fatigue  HEENT: negative for vision changes, hearing loss, congestion, rhinorrhea, ST, epistaxis, or sinus pressure Cardiovascular: negative for chest pain or palpitations;  Respiratory: negative for hemoptysis, wheezing, shortness of breath, or cough Abdominal: negative for nausea, vomiting, diarrhea, or constipation; positive for abd pain (lower) Dermatological: negative for rash Neurologic: negative for headache, dizziness, or syncope Musc: positive for leg swelling All other systems reviewed and are otherwise negative with the exception to those above and in the HPI.  Physical Exam: Blood pressure 122/72, pulse 76, temperature 98 F (36.7 C), temperature source Oral, resp. rate 17, height 5' 1.5" (1.562 m), weight 208 lb (94.348 kg), last menstrual period 11/10/2014, SpO2 98 %., Body mass index is 38.67  kg/(m^2). General: Well developed, well nourished, in no acute distress. Head: Normocephalic, atraumatic, eyes without discharge, sclera non-icteric, nares are without discharge. Bilateral auditory canals clear, TM's are without perforation, pearly grey and translucent with reflective cone of light bilaterally. Oral cavity moist, posterior pharynx without exudate, erythema, peritonsillar abscess, or post nasal drip.  Neck: Supple. No thyromegaly. Full ROM. No lymphadenopathy. Lungs: Clear bilaterally to auscultation without wheezes, rales, or rhonchi. Breathing is  unlabored. Heart: RRR with S1 S2. No murmurs, rubs, or gallops appreciated. Abdomen: Soft, non-distended with normoactive bowel sounds.  Msk:  Strength and tone normal for age. Extremities/Skin: Warm and dry. No clubbing or cyanosis. No edema. No rashes or suspicious lesions. Neuro: Alert and oriented X 3. Moves all extremities spontaneously. Gait is normal. CNII-XII grossly in tact. Psych:  Responds to questions appropriately with a normal affect.   Labs: Results for orders placed or performed in visit on 12/08/14  POCT CBC  Result Value Ref Range   WBC 7.3 4.6 - 10.2 K/uL   Lymph, poc 2.6 0.6 - 3.4   POC LYMPH PERCENT 35.3 10 - 50 %L   MID (cbc) 0.6 0 - 0.9   POC MID % 8.2 0 - 12 %M   POC Granulocyte 4.1 2 - 6.9   Granulocyte percent 56.5 37 - 80 %G   RBC 3.83 (A) 4.04 - 5.48 M/uL   Hemoglobin 11.0 (A) 12.2 - 16.2 g/dL   HCT, POC 32.6 (A) 37.7 - 47.9 %   MCV 85.3 80 - 97 fL   MCH, POC 28.8 27 - 31.2 pg   MCHC 33.8 31.8 - 35.4 g/dL   RDW, POC 13.2 %   Platelet Count, POC 376 142 - 424 K/uL   MPV 6.5 0 - 99.8 fL  POCT urinalysis dipstick  Result Value Ref Range   Color, UA yellow yellow   Clarity, UA clear clear   Glucose, UA negative negative   Bilirubin, UA negative negative   Ketones, POC UA negative negative   Spec Grav, UA 1.025    Blood, UA moderate (A) negative   pH, UA 6.0    Protein Ur, POC negative negative   Urobilinogen, UA 0.2    Nitrite, UA Negative Negative   Leukocytes, UA Trace (A) Negative  POCT Microscopic Urinalysis ([facility])  Result Value Ref Range   WBC,UR,HPF,POC Moderate (A) None WBC/hpf   RBC,UR,HPF,POC Few (A) None RBC/hpf   Bacteria Few (A) None, Too numerous to count   Mucus Absent Absent   Epithelial Cells, UR Per Microscopy Few (A) None, Too numerous to count cells/hpf   [facility] reading (PRIMARY) by  Dr. Joseph Art:  No acute findings on the plain film of the abdomen.    ASSESSMENT AND PLAN:  44 y.o. year old female with  This chart was  scribed in my presence and reviewed by me personally.    ICD-9-CM ICD-10-CM   1. LLQ abdominal pain 789.04 R10.32 POCT CBC     POCT urinalysis dipstick     POCT Microscopic Urinalysis ([facility])     COMPLETE METABOLIC PANEL WITH GFR     DG Abd 1 View     ciprofloxacin (CIPRO) 500 MG tablet  2. Abnormal uterine bleeding 626.9 N93.9 Ambulatory referral to Gynecology  3. Other specified hypothyroidism 244.8 E03.8 Thyroid Panel With TSH  4. Pyuria 791.9 N39.0 ciprofloxacin (CIPRO) 500 MG tablet      By signing my name below, I, Moises Blood, attest that this documentation has been prepared  under the direction and in the presence of Robyn Haber, MD. Electronically Signed: Moises Blood, [place]. 12/08/2014 , 10:12 AM .  Signed, Robyn Haber, MD 12/08/2014 10:12 AM

## 2014-12-09 ENCOUNTER — Telehealth: Payer: Self-pay

## 2014-12-09 ENCOUNTER — Other Ambulatory Visit: Payer: Self-pay | Admitting: Family Medicine

## 2014-12-09 MED ORDER — DICYCLOMINE HCL 10 MG PO CAPS: 10.0000 mg | ORAL_CAPSULE | Freq: Three times a day (TID) | ORAL | Status: DC

## 2014-12-09 NOTE — Telephone Encounter (Signed)
Pt was seen on 11/10 by Dr. Joseph Art for LLQ abdominal pain - Primary. She is still having pain, she would like something for this. She would like [place] to use [facility] on [address]. CB # [phone]

## 2014-12-09 NOTE — Telephone Encounter (Signed)
Dr. Carlean Jews  Please see previous message and please advise

## 2014-12-23 ENCOUNTER — Ambulatory Visit: Payer: BLUE CROSS/BLUE SHIELD | Admitting: Family Medicine

## 2015-01-06 ENCOUNTER — Ambulatory Visit: Payer: BLUE CROSS/BLUE SHIELD | Admitting: Gynecology

## 2015-02-06 ENCOUNTER — Telehealth: Payer: Self-pay | Admitting: *Deleted

## 2015-02-06 ENCOUNTER — Encounter: Payer: Self-pay | Admitting: Family Medicine

## 2015-02-06 NOTE — Telephone Encounter (Signed)
Called patient left message in voice-mail of cell number to call back. She was not here for appointment time today.

## 2015-02-21 ENCOUNTER — Encounter: Payer: Self-pay | Admitting: Family Medicine

## 2015-02-21 ENCOUNTER — Telehealth: Payer: Self-pay

## 2015-02-21 ENCOUNTER — Ambulatory Visit (INDEPENDENT_AMBULATORY_CARE_PROVIDER_SITE_OTHER): Payer: BLUE CROSS/BLUE SHIELD | Admitting: Family Medicine

## 2015-02-21 ENCOUNTER — Ambulatory Visit (INDEPENDENT_AMBULATORY_CARE_PROVIDER_SITE_OTHER): Payer: BLUE CROSS/BLUE SHIELD

## 2015-02-21 VITALS — HR 67 | Temp 98.0°F | Resp 18 | Ht 61.5 in | Wt 209.0 lb

## 2015-02-21 DIAGNOSIS — N3000 Acute cystitis without hematuria: Secondary | ICD-10-CM | POA: Diagnosis not present

## 2015-02-21 DIAGNOSIS — R0602 Shortness of breath: Secondary | ICD-10-CM

## 2015-02-21 DIAGNOSIS — N921 Excessive and frequent menstruation with irregular cycle: Secondary | ICD-10-CM

## 2015-02-21 DIAGNOSIS — R079 Chest pain, unspecified: Secondary | ICD-10-CM

## 2015-02-21 DIAGNOSIS — D509 Iron deficiency anemia, unspecified: Secondary | ICD-10-CM

## 2015-02-21 DIAGNOSIS — D649 Anemia, unspecified: Secondary | ICD-10-CM

## 2015-02-21 DIAGNOSIS — J4520 Mild intermittent asthma, uncomplicated: Secondary | ICD-10-CM | POA: Diagnosis not present

## 2015-02-21 DIAGNOSIS — E8881 Metabolic syndrome: Secondary | ICD-10-CM | POA: Diagnosis not present

## 2015-02-21 DIAGNOSIS — H43399 Other vitreous opacities, unspecified eye: Secondary | ICD-10-CM | POA: Diagnosis not present

## 2015-02-21 DIAGNOSIS — E669 Obesity, unspecified: Secondary | ICD-10-CM | POA: Diagnosis not present

## 2015-02-21 DIAGNOSIS — M2550 Pain in unspecified joint: Secondary | ICD-10-CM

## 2015-02-21 DIAGNOSIS — R7302 Impaired glucose tolerance (oral): Secondary | ICD-10-CM | POA: Diagnosis not present

## 2015-02-21 DIAGNOSIS — E039 Hypothyroidism, unspecified: Secondary | ICD-10-CM | POA: Diagnosis not present

## 2015-02-21 DIAGNOSIS — R3129 Other microscopic hematuria: Secondary | ICD-10-CM

## 2015-02-21 LAB — POC MICROSCOPIC URINALYSIS (UMFC)

## 2015-02-21 LAB — LIPID PANEL
CHOLESTEROL: 144 mg/dL (ref 125–200)
HDL: 43 mg/dL — AB (ref >=46)
LDL Cholesterol: 71 mg/dL (ref <130)
TRIGLYCERIDES: 152 mg/dL — AB (ref <150)
Total CHOL/HDL Ratio: 3.3 Ratio (ref <=5.0)
VLDL: 30 mg/dL (ref <30)

## 2015-02-21 LAB — POCT CBC
Granulocyte percent: 55.7 %G (ref 37–80)
HCT, POC: 31.4 % — AB (ref 37.7–47.9)
Hemoglobin: 10.7 g/dL — AB (ref 12.2–16.2)
Lymph, poc: 2.4 (ref 0.6–3.4)
MCH: 28 pg (ref 27–31.2)
MCHC: 34.2 g/dL (ref 31.8–35.4)
MCV: 81.9 fL (ref 80–97)
MID (CBC): 0.5 (ref 0–0.9)
MPV: 7.1 fL (ref 0–99.8)
POC Granulocyte: 3.6 (ref 2–6.9)
POC LYMPH PERCENT: 36.4 %L (ref 10–50)
POC MID %: 7.9 % (ref 0–12)
Platelet Count, POC: 359 10*3/uL (ref 142–424)
RBC: 3.84 M/uL — AB (ref 4.04–5.48)
RDW, POC: 14.4 %
WBC: 6.5 10*3/uL (ref 4.6–10.2)

## 2015-02-21 LAB — COMPREHENSIVE METABOLIC PANEL
ALBUMIN: 3.6 g/dL (ref 3.6–5.1)
ALT: 10 U/L (ref 6–29)
AST: 10 U/L (ref 10–35)
Alkaline Phosphatase: 62 U/L (ref 33–115)
BUN: 12 mg/dL (ref 7–25)
CALCIUM: 8.6 mg/dL (ref 8.6–10.2)
CO2: 25 mmol/L (ref 20–31)
CREATININE: 0.61 mg/dL (ref 0.50–1.10)
Chloride: 105 mmol/L (ref 98–110)
Glucose, Bld: 116 mg/dL — ABNORMAL HIGH (ref 65–99)
Potassium: 4.5 mmol/L (ref 3.5–5.3)
Sodium: 139 mmol/L (ref 135–146)
TOTAL PROTEIN: 6.5 g/dL (ref 6.1–8.1)
Total Bilirubin: 0.3 mg/dL (ref 0.2–1.2)

## 2015-02-21 LAB — TROPONIN I: Troponin I: <0.01 ng/mL (ref <0.06)

## 2015-02-21 LAB — POCT URINALYSIS DIP (MANUAL ENTRY)
Bilirubin, UA: NEGATIVE
Glucose, UA: NEGATIVE
Ketones, POC UA: NEGATIVE
Nitrite, UA: NEGATIVE
Protein Ur, POC: NEGATIVE
Spec Grav, UA: 1.02
Urobilinogen, UA: 0.2
pH, UA: 5.5

## 2015-02-21 LAB — THYROID PANEL WITH TSH
Free Thyroxine Index: 2.4 (ref 1.4–3.8)
T3 Uptake: 23 % (ref 22–35)
T4 TOTAL: 10.4 ug/dL (ref 4.5–12.0)
TSH: 6.826 u[IU]/mL — AB (ref 0.350–4.500)

## 2015-02-21 LAB — C-REACTIVE PROTEIN: CRP: 1.7 mg/dL — AB (ref <0.60)

## 2015-02-21 LAB — FERRITIN: Ferritin: 8 ng/mL — ABNORMAL LOW (ref 10–291)

## 2015-02-21 LAB — POCT SEDIMENTATION RATE: POCT SED RATE: 47 mm/hr — AB (ref 0–22)

## 2015-02-21 LAB — CK: CK TOTAL: 64 U/L (ref 7–177)

## 2015-02-21 LAB — POCT GLYCOSYLATED HEMOGLOBIN (HGB A1C): Hemoglobin A1C: 6.1

## 2015-02-21 MED ORDER — ALBUTEROL SULFATE HFA 108 (90 BASE) MCG/ACT IN AERS
2.0000 | INHALATION_SPRAY | RESPIRATORY_TRACT | Status: DC | PRN
Start: 2015-02-21 — End: 2016-11-03

## 2015-02-21 MED ORDER — GI COCKTAIL ~~LOC~~
30.0000 mL | Freq: Once | ORAL | Status: AC
  Administered 2015-02-21: 30 mL via ORAL

## 2015-02-21 MED ORDER — KETOROLAC TROMETHAMINE 60 MG/2ML IM SOLN
60.0000 mg | Freq: Once | INTRAMUSCULAR | Status: AC
  Administered 2015-02-21: 60 mg via INTRAMUSCULAR

## 2015-02-21 MED ORDER — MELOXICAM 15 MG PO TABS: 15.0000 mg | ORAL_TABLET | Freq: Every day | ORAL | Status: DC

## 2015-02-21 MED ORDER — ALBUTEROL SULFATE (2.5 MG/3ML) 0.083% IN NEBU
2.5000 mg | INHALATION_SOLUTION | Freq: Once | RESPIRATORY_TRACT | Status: AC
  Administered 2015-02-21: 2.5 mg via RESPIRATORY_TRACT

## 2015-02-21 NOTE — Telephone Encounter (Signed)
Dr Brigitte Pulse    Patient states the shot wore off and her back is hurting again.  [phone] (H)

## 2015-02-21 NOTE — Progress Notes (Addendum)
Subjective:  By signing my name below, I, [name], attest that this documentation has been prepared under the direction and in the presence of Kathy Cheadle, MD.  [name], Medical Scribe. 02/21/2015.  9:44 AM.    Patient ID: [name], female    DOB: 01-09-1971, 45 y.o.   MRN: [id number]  Chief Complaint  Patient presents with  . Chest Pain     x 5 days  . Shortness of Breath    HPI HPI Comments: [name] is a 45 y.o. female who presents to [facility] complaining of experiencing a waxing and waning episode of dull chest pain and shortness of breath since 5 days ago.  It seems that this pt was not seen by me previously. Last A1C was 5.8 three months prior. She has been mildly anemic, ranging 11-12 on Hb, and TSH was normal but abnormal T4 and T3 uptake over 2 months ago when she also had hematuria.   Pt reports that the pain is radiating to her back, and notes that raising her arm, rotating her neck, and tilting her head reproduce the pain. She indicates that being in a supine position causes her to feel as her chest is "sinking in". She reports the pain is more severe after waking up in the morning time, and it eases off as the day goes on. The pain is exacerbated with lifting or using her arms.   Pt also notes experiencing intermittent headaches on the head BL, visual disturbances such as seeing spots, however she notes that the vision has always be blurry. 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  Pt notes that with her episode of visual disturbances, EMS was called for her, and she notes that during their assessment, her EKG showed normal results, however her blood pressure was elevated, she does not recall the exact reading.  She also reports experiencing a sharp right upper chest pain prior to her episode 5 days ago, and reports associated shortness of breath with it  at that time. She indicates that this episode lasted for about 1 day. She reports undergoing no previous cardiology follow up or having a cardiac work-up.   Pt has a history of asthma, and notes having an inhaler at home that she has not felt the need to use in a while.   Pt has not taken any OTC medication and no nsaids recently, and denies using any alcohol and drinks. She is not a smoker. Pt denies change in activity or heavy lifting, diaphoresis, heart burn, indigestion, sleep disturbance, cough, metallic taste in the mouth after waking up in the morning, nausea, vomiting, lightheadedness, dizziness, abdominal pain.   Patient Active Problem List   Diagnosis Date Noted  . Hypothyroidism 11/24/2014   Past Medical History  Diagnosis Date  . Diabetes mellitus without complication ([place])   . Asthma   . Thyroid disease   . Chronic daily headache   . Chronic back pain   . Sciatica   . Chronic neck pain    Past Surgical History  Procedure Laterality Date  . Tubal ligation    . Cholecystectomy    . Cesarean section     Allergies  Allergen Reactions  . Bactrim [Sulfamethoxazole-Trimethoprim] Hives  . Penicillins Other (See Comments)    "when I pee it hurts"   Prior to Admission medications   Medication  Sig Start Date End Date Taking? Authorizing Provider  acetaminophen (TYLENOL) 500 MG tablet Take 1,000 mg by mouth every 6 (six) hours as needed for moderate pain.   Yes Historical Provider, MD  levothyroxine (SYNTHROID, LEVOTHROID) 50 MCG tablet Take 1 tablet (50 mcg total) by mouth daily. 11/10/14  Yes Robyn Haber, MD  dicyclomine (BENTYL) 10 MG capsule Take 1 capsule (10 mg total) by mouth 4 (four) times daily -  before meals and at bedtime. Patient not taking: Reported on 02/21/2015 12/09/14   Robyn Haber, MD   Social History   Social History  . Marital Status: Married    Spouse Name: N/A  . Number of Children: N/A  . Years of Education: N/A   Occupational History    . Not on file.   Social History Main Topics  . Smoking status: Never Smoker   . Smokeless tobacco: Not on file  . Alcohol Use: No  . Drug Use: No  . Sexual Activity: Not on file   Other Topics Concern  . Not on file   Social History Narrative    Review of Systems  Constitutional: Negative for diaphoresis, activity change and unexpected weight change.  Eyes: Positive for visual disturbance.  Respiratory: Positive for shortness of breath. Negative for cough.   Cardiovascular: Positive for chest pain.  Gastrointestinal: Negative for nausea, vomiting and abdominal pain.  Musculoskeletal: Positive for back pain and neck pain.  Neurological: Positive for headaches. Negative for dizziness and light-headedness.  Psychiatric/Behavioral: Negative for sleep disturbance.      Objective:   Physical Exam  Constitutional: She is oriented to person, place, and time. She appears well-developed and well-nourished. No distress.  HENT:  Head: Normocephalic and atraumatic.  Eyes: EOM are normal. Pupils are equal, round, and reactive to light.  Neck: Neck supple.  Question of thyroid goiter.  Cardiovascular: Regular rhythm, S1 normal and S2 normal.  Tachycardia present.   No murmur heard. No carotid breweries.  Pulmonary/Chest: Effort normal and breath sounds normal. No respiratory distress. She has no wheezes. She has no rales.  Good air movement. No sternal pain.  Mild tenderness over the right costosternal junction around 3-4 area.   Musculoskeletal:  Rhomboid muscle spasm on right greater than left, but pain is not localized over those borderline thoracic spine.   Lymphadenopathy:    She has no cervical adenopathy.       Right: No supraclavicular adenopathy present.       Left: No supraclavicular adenopathy present.  Neurological: She is alert and oriented to person, place, and time. No cranial nerve deficit.  Skin: Skin is warm and dry. No rash noted.  Psychiatric: She has a normal  mood and affect. Her behavior is normal.  Nursing note and vitals reviewed.   BP 142/88 mmHg  Pulse 67  Temp(Src) 98 F (36.7 C) (Oral)  Resp 18  Ht 5' 1.5" (1.562 m)  Wt 209 lb (94.802 kg)  BMI 38.86 kg/m2  SpO2 96%  Predicted peak flow- 466.  Actual peak flow- 275.   Peak flow s/p albuterol nep treatment- unchanged.   EKG reading: normal sinus rhythm, no ischemic changes.   Results for orders placed or performed in visit on 02/21/15  POCT CBC  Result Value Ref Range   WBC 6.5 4.6 - 10.2 K/uL   Lymph, poc 2.4 0.6 - 3.4   POC LYMPH PERCENT 36.4 10 - 50 %L   MID (cbc) 0.5 0 - 0.9   POC MID %  7.9 0 - 12 %M   POC Granulocyte 3.6 2 - 6.9   Granulocyte percent 55.7 37 - 80 %G   RBC 3.84 (A) 4.04 - 5.48 M/uL   Hemoglobin 10.7 (A) 12.2 - 16.2 g/dL   HCT, POC 31.4 (A) 37.7 - 47.9 %   MCV 81.9 80 - 97 fL   MCH, POC 28.0 27 - 31.2 pg   MCHC 34.2 31.8 - 35.4 g/dL   RDW, POC 14.4 %   Platelet Count, POC 359 142 - 424 K/uL   MPV 7.1 0 - 99.8 fL  POCT urinalysis dipstick  Result Value Ref Range   Color, UA yellow yellow   Clarity, UA clear clear   Glucose, UA negative negative   Bilirubin, UA negative negative   Ketones, POC UA negative negative   Spec Grav, UA 1.020    Blood, UA trace-intact (A) negative   pH, UA 5.5    Protein Ur, POC negative negative   Urobilinogen, UA 0.2    Nitrite, UA Negative Negative   Leukocytes, UA Trace (A) Negative  POCT Microscopic Urinalysis ([facility])  Result Value Ref Range   WBC,UR,HPF,POC Many (A) None WBC/hpf   RBC,UR,HPF,POC Few (A) None RBC/hpf   Bacteria Many (A) None, Too numerous to count   Mucus Present (A) Absent   Epithelial Cells, UR Per Microscopy Moderate (A) None, Too numerous to count cells/hpf  POCT glycosylated hemoglobin (Hb A1C)  Result Value Ref Range   Hemoglobin A1C 6.1     Dg Chest 2 View  02/21/2015  CLINICAL DATA:  Shortness of breath, history of asthma, nonsmoker. EXAM: CHEST  2 VIEW COMPARISON:  PA and  lateral chest x-ray of September 13, 2014 FINDINGS: The lungs are adequately inflated and clear. The heart and pulmonary vascularity are normal. The mediastinum is normal in width. There is no pleural effusion. The trachea is midline. The bony thorax exhibits no acute abnormality. IMPRESSION: There is no active cardiopulmonary disease. Electronically Signed   By: David  Martinique M.D.   On: 02/21/2015 10:40   Pt states after the GI cocktail and the albuterol Neb, her pain did not improve. She notes that she was trying very hard with the peak flow, and seems to understand the mechanism.     Assessment & Plan:    1. Chest pain, unspecified chest pain type - no improvement in office with alb neb, GI cocktail, or toradol. Sxs atypical for CAD but still needs cardiac r/o due to risk factors of metabolic syndrome (elevated BP, central obesity, glucose intolerance) so refer to cardiology for further eval.  Based upon assessment today I most suspect MSK/costochondritis as etiology so will cover with meloxicam qd but watch bp and d/c cox-2/nsaids after several days if pain does not respond or BP increases.  If she does not have any benefit from the toradol today, recommend NOT starting meloxicam or other nsaids until after cardiology eval.  2. Shortness of breath  - peak flow today drastically reduced and no improvement with albuterol neb - pt seemed to be performing technique correctly, could be obesity hypoventilation but does not correlate with the sudden onset pt is reporting -  refer to pulmonology  3. Floaters in visual field, unspecified laterality   4. Microscopic hematuria - improved; due to UTI today, recheck at f/u OV in 6 wks  5. Glucose intolerance (impaired glucose tolerance) - a1c 6.1 today up from 5.8 3 mos prior.  6. Hypothyroidism, unspecified hypothyroidism type - tsh elevated ->  increase levothyroxine from 50 to 75 mcg - recheck tsh in 6-8 wks before further refills.  7. Asthma, mild intermittent,  uncomplicated  8. Anemia, unspecified anemia type - increase iron in diet as anemia worsening. Ferritin at 8 so start once daily iron sup.  9.    UTI - incidentally found but may be contributing to pt's feeling of unwellness - Clx P - treat with cipro 250 qd x 3d. ADDENDUM:  Uclx is resistant to all oral antibiotics and rocephin other than macrobid - treat with macrobid x 1 wk but recheck urine culture in 1 week to ensure clearance since this is ineffective for pyelo and pt is having systemic sxs.  If sxs worsen at all, pt needs to go to the ER for recheck as she will need IV antibiotics. 10.    Metrorrhagia - at end of visit pt reported irregular periods for quiet some time, and recurrent hematuria. Advised to discuss this in a follow up visit in 4-6 weeks. Hopefully this will regulate once levothyroxine is adjusted. 11.  Arthralgias/myalgias - mildly elevated esr/crp - likely due to acute abnormalities of UTI, underdosed levothyroxine, iron deficiency so recheck at f/u visit and do additional autoimmune w/u if persists at that time.  Orders Placed This Encounter  Procedures  . Urine culture  . DG Chest 2 View    Standing Status: Future     Number of Occurrences: 1     Standing Expiration Date: 02/21/2016    Order Specific Question:  Reason for Exam (SYMPTOM  OR DIAGNOSIS REQUIRED)    Answer:  shortness of breath, chest pain radiating to back, pain over mid-thoracic spine    Order Specific Question:  Is the patient pregnant?    Answer:  No    Order Specific Question:  Preferred imaging location?    Answer:  External  . Comprehensive metabolic panel    Order Specific Question:  Has the patient fasted?    Answer:  Yes  . C-reactive protein  . CK  . Lipid panel    Order Specific Question:  Has the patient fasted?    Answer:  Yes  . Thyroid Panel With TSH  . Troponin I  . Ferritin  . Ambulatory referral to Cardiology    Referral Priority:  Routine    Referral Type:  Consultation     Referral Reason:  Specialty Services Required    Requested Specialty:  Cardiology    Number of Visits Requested:  1  . Ambulatory referral to Pulmonology    Referral Priority:  Routine    Referral Type:  Consultation    Referral Reason:  Specialty Services Required    Requested Specialty:  Pulmonary Disease    Number of Visits Requested:  1  . Peak flow    Standing Status: Standing     Number of Occurrences: 1     Standing Expiration Date:     Order Specific Question:  Pre and post Neb    Answer:  Yes  . POCT CBC  . POCT SEDIMENTATION RATE  . POCT urinalysis dipstick  . POCT Microscopic Urinalysis ([facility])  . POCT glycosylated hemoglobin (Hb A1C)  . EKG 12-Lead    Meds ordered this encounter  Medications  . gi cocktail (Maalox,Lidocaine,Donnatal)    Sig:   . albuterol (PROVENTIL) (2.5 MG/3ML) 0.083% nebulizer solution 2.5 mg    Sig:   . albuterol (PROVENTIL HFA;VENTOLIN HFA) 108 (90 Base) MCG/ACT inhaler    Sig: Inhale 2 puffs  into the lungs every 4 (four) hours as needed for wheezing or shortness of breath (cough, shortness of breath or wheezing.).    Dispense:  1 Inhaler    Refill:  1  . meloxicam (MOBIC) 15 MG tablet    Sig: Take 1 tablet (15 mg total) by mouth daily.    Dispense:  30 tablet    Refill:  0  . ketorolac (TORADOL) injection 60 mg    Sig:   . levothyroxine (SYNTHROID, LEVOTHROID) 75 MCG tablet    Sig: Take 1 tablet (75 mcg total) by mouth daily.    Dispense:  90 tablet    Refill:  0  . ciprofloxacin (CIPRO) 250 MG tablet    Sig: Take 1 tablet (250 mg total) by mouth 2 (two) times daily.    Dispense:  6 tablet    Refill:  0   Over 40 min spent in face-to-face evaluation of and consultation with patient and coordination of care.  Over 50% of this time was spent counseling this patient.  I personally performed the services described in this documentation, which was scribed in my presence. The recorded information has been reviewed and considered, and  addended by me as needed.  Kathy Cheadle, MD MPH

## 2015-02-21 NOTE — Patient Instructions (Addendum)
.Because you received an x-ray today, you will receive an invoice from [facility]. Please contact [facility] at [phone] with questions or concerns regarding your invoice. Our billing staff will not be able to assist you with those questions.  Start an iron supplement once a day - I recommend ferrous sulfate 383m (639mof elemental iron).  Take this on an empty stomach 30 minutes before eating or 2 hours after a meal.  Take it with a vitamin C supplement or a small glass of orange juice.  You need to drink WAY WAY more water.  Take the meloxicam every morning. Do not use with any other otc pain medication other than tylenol/acetaminophen - so no aleve, ibuprofen, motrin, advil, etc.  Whenever you feel short of breath, use the inhaler. If your symptoms continue, come back immediately.   Costochondritis Costochondritis, sometimes called Tietze syndrome, is a swelling and irritation (inflammation) of the tissue (cartilage) that connects your ribs with your breastbone (sternum). It causes pain in the chest and rib area. Costochondritis usually goes away on its own over time. It can take up to 6 weeks or longer to get better, especially if you are unable to limit your activities. CAUSES  Some cases of costochondritis have no known cause. Possible causes include:  Injury (trauma).  Exercise or activity such as lifting.  Severe coughing. SIGNS AND SYMPTOMS  Pain and tenderness in the chest and rib area.  Pain that gets worse when coughing or taking deep breaths.  Pain that gets worse with specific movements. DIAGNOSIS  Your health care provider will do a physical exam and ask about your symptoms. Chest X-rays or other tests may be done to rule out other problems. TREATMENT  Costochondritis usually goes away on its own over time. Your health care provider may prescribe medicine to help relieve pain. HOME CARE INSTRUCTIONS   Avoid exhausting physical activity. Try not  to strain your ribs during normal activity. This would include any activities using chest, abdominal, and side muscles, especially if heavy weights are used.  Apply ice to the affected area for the first 2 days after the pain begins.  Put ice in a plastic bag.  Place a towel between your skin and the bag.  Leave the ice on for 20 minutes, 2-3 times a day.  Only take over-the-counter or prescription medicines as directed by your health care provider. SEEK MEDICAL CARE IF:  You have redness or swelling at the rib joints. These are signs of infection.  Your pain does not go away despite rest or medicine. SEEK IMMEDIATE MEDICAL CARE IF:   Your pain increases or you are very uncomfortable.  You have shortness of breath or difficulty breathing.  You cough up blood.  You have worse chest pains, sweating, or vomiting.  You have a fever or persistent symptoms for more than 2-3 days.  You have a fever and your symptoms suddenly get worse. MAKE SURE YOU:   Understand these instructions.  Will watch your condition.  Will get help right away if you are not doing well or get worse.   This information is not intended to replace advice given to you by your health care provider. Make sure you discuss any questions you have with your health care provider.   Document Released: 10/24/2004 Document Revised: 11/04/2012 Document Reviewed: 08/18/2012 Elsevier Interactive Patient Education 2016 [employer] Iron-Rich Diet Iron is a mineral that helps your body to produce hemoglobin. Hemoglobin is a protein in your  red blood cells that carries oxygen to your body's tissues. Eating too little iron may cause you to feel weak and tired, and it can increase your risk for infection. Eating enough iron is necessary for your body's metabolism, muscle function, and nervous system. Iron is naturally found in many foods. It can also be added to foods or fortified in foods. There are two types of dietary  iron:  Heme iron. Heme iron is absorbed by the body more easily than nonheme iron. Heme iron is found in meat, poultry, and fish.  Nonheme iron. Nonheme iron is found in dietary supplements, iron-fortified grains, beans, and vegetables. You may need to follow an iron-rich diet if:  You have been diagnosed with iron deficiency or iron-deficiency anemia.  You have a condition that prevents you from absorbing dietary iron, such as:  Infection in your intestines.  Celiac disease. This involves long-lasting (chronic) inflammation of your intestines.  You do not eat enough iron.  You eat a diet that is high in foods that impair iron absorption.  You have lost a lot of blood.  You have heavy bleeding during your menstrual cycle.  You are pregnant. WHAT IS MY PLAN? Your health care provider may help you to determine how much iron you need per day based on your condition. Generally, when a person consumes sufficient amounts of iron in the diet, the following iron needs are met:  Men.  8-3 years old: 11 mg per day.  53-34 years old: 8 mg per day.  Women.   61-53 years old: 15 mg per day.  5-65 years old: 18 mg per day.  Over 79 years old: 8 mg per day.  Pregnant women: 27 mg per day.  Breastfeeding women: 9 mg per day. WHAT DO I NEED TO KNOW ABOUT AN IRON-RICH DIET?  Eat fresh fruits and vegetables that are high in vitamin C along with foods that are high in iron. This will help increase the amount of iron that your body absorbs from food, especially with foods containing nonheme iron. Foods that are high in vitamin C include oranges, peppers, tomatoes, and mango.  Take iron supplements only as directed by your health care provider. Overdose of iron can be life-threatening. If you were prescribed iron supplements, take them with orange juice or a vitamin C supplement.  Cook foods in pots and pans that are made from iron.   Eat nonheme iron-containing foods alongside  foods that are high in heme iron. This helps to improve your iron absorption.   Certain foods and drinks contain compounds that impair iron absorption. Avoid eating these foods in the same meal as iron-rich foods or with iron supplements. These include:  Coffee, black tea, and red wine.  Milk, dairy products, and foods that are high in calcium.  Beans, soybeans, and peas.  Whole grains.  When eating foods that contain both nonheme iron and compounds that impair iron absorption, follow these tips to absorb iron better.   Soak beans overnight before cooking.  Soak whole grains overnight and drain them before using.  Ferment flours before baking, such as using yeast in bread dough. WHAT FOODS CAN I EAT? Grains Iron-fortified breakfast cereal. Iron-fortified whole-wheat bread. Enriched rice. Sprouted grains. Vegetables Spinach. Potatoes with skin. Green peas. Broccoli. Red and green bell peppers. Fermented vegetables. Fruits Prunes. Raisins. Oranges. Strawberries. Mango. Grapefruit. Meats and Other Protein Sources Beef liver. Oysters. Beef. Shrimp. [place]. Chicken. [place]. Sardines. Chickpeas. Nuts. Tofu. Beverages Tomato juice. Fresh orange  juice. Prune juice. Hibiscus tea. Fortified instant breakfast shakes. Condiments Tahini. Fermented soy sauce. Sweets and Desserts Black-strap molasses.  Other Wheat germ. The items listed above may not be a complete list of recommended foods or beverages. Contact your dietitian for more options. WHAT FOODS ARE NOT RECOMMENDED? Grains Whole grains. Bran cereal. Bran flour. Oats. Vegetables Artichokes. Brussels sprouts. Kale. Fruits Blueberries. Raspberries. Strawberries. Figs. Meats and Other Protein Sources Soybeans. Products made from soy protein. Dairy Milk. Cream. Cheese. Yogurt. Cottage cheese. Beverages Coffee. Black tea. Red wine. Sweets and Desserts Cocoa. Chocolate. Ice cream. Other Basil. Oregano.  Parsley. The items listed above may not be a complete list of foods and beverages to avoid. Contact your dietitian for more information.   This information is not intended to replace advice given to you by your health care provider. Make sure you discuss any questions you have with your health care provider.   Document Released: 08/28/2004 Document Revised: 02/04/2014 Document Reviewed: 08/11/2013 Elsevier Interactive Patient Education [employer].

## 2015-02-22 NOTE — Telephone Encounter (Signed)
meloxicam is on the $4 list at [facility].  If it is more expensive on her insurance than just have the pharmacy send the rx over the [facility] and have them NOT run it through her insurance - just pay out of pocket for it.

## 2015-02-22 NOTE — Telephone Encounter (Signed)
?    The shot was more to help her chest pain - did it?  If it did, then we suspect it is musculoskeletal and she can take a meloxicam every morning which was sent in to her pharmacy yesterday and as needed tylenol throughout the day - do not use > 3-4g of acetaminophen daily.    If she continues to have pain, needs to come back into clinic for further evaluation

## 2015-02-22 NOTE — Telephone Encounter (Signed)
SPoke with pt, she states it helped the pain a little bit. She cannot afford the meloxicam that was sent. I advised her to RTC if pain persist. He also asked about her lab results.

## 2015-02-23 LAB — URINE CULTURE: Colony Count: >=100000

## 2015-02-23 NOTE — Telephone Encounter (Signed)
Pt advised.

## 2015-02-24 ENCOUNTER — Telehealth: Payer: Self-pay

## 2015-02-24 DIAGNOSIS — N921 Excessive and frequent menstruation with irregular cycle: Secondary | ICD-10-CM | POA: Insufficient documentation

## 2015-02-24 DIAGNOSIS — E8881 Metabolic syndrome: Secondary | ICD-10-CM | POA: Insufficient documentation

## 2015-02-24 DIAGNOSIS — E669 Obesity, unspecified: Secondary | ICD-10-CM | POA: Insufficient documentation

## 2015-02-24 DIAGNOSIS — D509 Iron deficiency anemia, unspecified: Secondary | ICD-10-CM | POA: Insufficient documentation

## 2015-02-24 MED ORDER — NITROFURANTOIN MONOHYD MACRO 100 MG PO CAPS: 100.0000 mg | ORAL_CAPSULE | Freq: Two times a day (BID) | ORAL | Status: DC

## 2015-02-24 MED ORDER — CIPROFLOXACIN HCL 250 MG PO TABS: 250.0000 mg | ORAL_TABLET | Freq: Two times a day (BID) | ORAL | Status: DC

## 2015-02-24 MED ORDER — LEVOTHYROXINE SODIUM 75 MCG PO TABS: 75.0000 ug | ORAL_TABLET | Freq: Every day | ORAL | Status: DC

## 2015-02-24 NOTE — Addendum Note (Signed)
Addended by: [name] on: 02/24/2015 11:16 AM   Modules accepted: Orders, Medications

## 2015-02-24 NOTE — Telephone Encounter (Signed)
Sppke with pt, I advised her the message from Dr. Brigitte Pulse. She wants Dr. Brigitte Pulse to call her. She is unable to come in due to financial reasons and states she can pick the antibiotic up tomorrow. I expressed the importance of getting all the medications today. Can you call pt?

## 2015-02-24 NOTE — Telephone Encounter (Signed)
Ok printed

## 2015-02-24 NOTE — Telephone Encounter (Addendum)
There is a string of lab messages on same topic so please address both notes at once. Called pt and LVM to call back and get to pharmacy to pick up meds asap.   She has a UTI that is only responsive to nitrofurantoin and if the infection has gone up to her kidneys before she starts treatment, then she will need to be hospitalized for IV antibiotics so start nitrofurantoin antibiotic immediately.  If she is not feeling better within 2-3d or at any point becomes worse with fever/chills/abd pain/flank pain/n/v, etc needs to go to the ER immed for IV abx. Hold off on the meloxicam or any other nsaids for the time being - so no aleve, ibuprofen, motrin, advil, etc.   I have referred pt to pulmonology and cardiology for further evaluation but if her symptoms are worsening, she needs to come back in.  These can be very serious - even life threatening problems - and heart and lung symptoms need to be re-evaluated in the office with a physical exam.   Please let pt know that in addition to the UTI, her levothyroxine dose was to low - both of these things can cause systemic symptoms and could lead to muscle aches/joint pains all over - go to the pharmacy to pick up the new higher levothyroxine (75 mcg) and the antibiotic but if her sxs cont or worsen, she has to come back in - it is to dangerous to treat without a complete picture of the problem when her symptoms could be warning sign of a heart attack or respiratory or heart failure or other. Also needs to start an iron supp - iron level is very low and causing anemia which can also make chest pain, shortness of breath, and fatigue worse.  As long as she is feeling better on the nitrofurantoin, then she can recheck in 1 wk to ensure infection cleared and then in 6 wks to ensure she is on the correct levothyoxine dose.

## 2015-02-24 NOTE — Telephone Encounter (Signed)
Left message for pt to call back  °

## 2015-02-24 NOTE — Telephone Encounter (Signed)
Patient of Dr. Brigitte Pulse. She was seen on 02/21/15 for pain. She still has pain in her chest and back. She says it's a dull, achy pain. It's hard for her to stand at work; she gets winded and has to take deeper breaths. She says she can't keep paying the 25% so she can't come back in to be seen. The medications she was prescribed are not working. She wants to know what Dr. Brigitte Pulse recommends. Cb# [phone].

## 2015-02-24 NOTE — Telephone Encounter (Signed)
Talked with pt and her mother for over 10 min- they will pick up the antibiotic now. She will increase her levothyroxine and start an iron supp. Understands that it is possible her CP and shortness of breath could be from her heart and that while these sxs might improve with above treatments, if she cont to have CP/SHoB she needs to come back in immediately for further eval. Understands needs for recheck if f/c/n/v/flank/abd pain.  Pt requests note to stay out of work today - please write for pt pick-up

## 2015-02-27 ENCOUNTER — Telehealth: Payer: Self-pay

## 2015-02-27 NOTE — Telephone Encounter (Signed)
Pt is having back and chest pain, the same pain that she was having at her last visit.

## 2015-02-27 NOTE — Telephone Encounter (Signed)
Patient states pain meds is not working.  [employer] [facility] on [address]   [phone]

## 2015-02-28 NOTE — Telephone Encounter (Signed)
Than she is going to need to be seen again and try to put a rush on cardiology and pulm referrals

## 2015-03-01 ENCOUNTER — Telehealth: Payer: Self-pay

## 2015-03-01 ENCOUNTER — Ambulatory Visit (INDEPENDENT_AMBULATORY_CARE_PROVIDER_SITE_OTHER): Payer: BLUE CROSS/BLUE SHIELD | Admitting: Family Medicine

## 2015-03-01 VITALS — BP 120/80 | HR 66 | Temp 98.5°F | Resp 20 | Ht 61.81 in | Wt 205.4 lb

## 2015-03-01 DIAGNOSIS — N3000 Acute cystitis without hematuria: Secondary | ICD-10-CM

## 2015-03-01 DIAGNOSIS — M791 Myalgia, unspecified site: Secondary | ICD-10-CM

## 2015-03-01 DIAGNOSIS — M546 Pain in thoracic spine: Secondary | ICD-10-CM | POA: Diagnosis not present

## 2015-03-01 DIAGNOSIS — R5383 Other fatigue: Secondary | ICD-10-CM | POA: Diagnosis not present

## 2015-03-01 DIAGNOSIS — R079 Chest pain, unspecified: Secondary | ICD-10-CM

## 2015-03-01 LAB — POCT SEDIMENTATION RATE: POCT SED RATE: 55 mm/hr — AB (ref 0–22)

## 2015-03-01 LAB — POCT URINALYSIS DIP (MANUAL ENTRY)
BILIRUBIN UA: NEGATIVE
BILIRUBIN UA: NEGATIVE
GLUCOSE UA: NEGATIVE
LEUKOCYTES UA: NEGATIVE
Nitrite, UA: NEGATIVE
PH UA: 6
Protein Ur, POC: NEGATIVE
RBC UA: NEGATIVE
Spec Grav, UA: 1.025
Urobilinogen, UA: 0.2

## 2015-03-01 LAB — POCT CBC
Granulocyte percent: 67.1 %G (ref 37–80)
HEMATOCRIT: 33.8 % — AB (ref 37.7–47.9)
HEMOGLOBIN: 11.1 g/dL — AB (ref 12.2–16.2)
LYMPH, POC: 2.4 (ref 0.6–3.4)
MCH, POC: 27 pg (ref 27–31.2)
MCHC: 32.9 g/dL (ref 31.8–35.4)
MCV: 81.9 fL (ref 80–97)
MID (CBC): 0.3 (ref 0–0.9)
MPV: 6.8 fL (ref 0–99.8)
POC GRANULOCYTE: 5.5 (ref 2–6.9)
POC LYMPH %: 29.2 % (ref 10–50)
POC MID %: 3.7 % (ref 0–12)
Platelet Count, POC: 401 10*3/uL (ref 142–424)
RBC: 4.13 M/uL (ref 4.04–5.48)
RDW, POC: 14.8 %
WBC: 8.2 10*3/uL (ref 4.6–10.2)

## 2015-03-01 LAB — POC MICROSCOPIC URINALYSIS (UMFC)

## 2015-03-01 MED ORDER — DICLOFENAC SODIUM 75 MG PO TBEC: 75.0000 mg | DELAYED_RELEASE_TABLET | Freq: Two times a day (BID) | ORAL | Status: DC

## 2015-03-01 MED ORDER — CYCLOBENZAPRINE HCL 10 MG PO TABS: 10.0000 mg | ORAL_TABLET | Freq: Three times a day (TID) | ORAL | Status: DC | PRN

## 2015-03-01 MED ORDER — OMEPRAZOLE MAGNESIUM 20 MG PO TBEC: 40.0000 mg | DELAYED_RELEASE_TABLET | Freq: Every day | ORAL | Status: DC

## 2015-03-01 NOTE — Patient Instructions (Signed)
I recommend keeping the diclofenac on board - esp taking before and during work. Then when you come home, take a muscle relaxant flexeril followed by 15 minutes of heat followed by gentle stretching. Try to do this regiment 3-4 times a day on days you aren't working.  If you are still having pain in 4 to 6 wks, come back to clinic for further eval. RTC immed if symptoms worsen or you develop any other concerning symptoms below.   Back Exercises If you have pain in your back, do these exercises 2-3 times each day or as told by your doctor. When the pain goes away, do the exercises once each day, but repeat the steps more times for each exercise (do more repetitions). If you do not have pain in your back, do these exercises once each day or as told by your doctor. EXERCISES Single Knee to Chest Do these steps 3-5 times in a row for each leg: 1. Lie on your back on a firm bed or the floor with your legs stretched out. 2. Bring one knee to your chest. 3. Hold your knee to your chest by grabbing your knee or thigh. 4. Pull on your knee until you feel a gentle stretch in your lower back. 5. Keep doing the stretch for 10-30 seconds. 6. Slowly let go of your leg and straighten it. Pelvic Tilt Do these steps 5-10 times in a row: 1. Lie on your back on a firm bed or the floor with your legs stretched out. 2. Bend your knees so they point up to the ceiling. Your feet should be flat on the floor. 3. Tighten your lower belly (abdomen) muscles to press your lower back against the floor. This will make your tailbone point up to the ceiling instead of pointing down to your feet or the floor. 4. Stay in this position for 5-10 seconds while you gently tighten your muscles and breathe evenly. Cat-Cow Do these steps until your lower back bends more easily: 1. Get on your hands and knees on a firm surface. Keep your hands under your shoulders, and keep your knees under your hips. You may put padding under your  knees. 2. Let your head hang down, and make your tailbone point down to the floor so your lower back is round like the back of a cat. 3. Stay in this position for 5 seconds. 4. Slowly lift your head and make your tailbone point up to the ceiling so your back hangs low (sags) like the back of a cow. 5. Stay in this position for 5 seconds. Press-Ups Do these steps 5-10 times in a row: 1. Lie on your belly (face-down) on the floor. 2. Place your hands near your head, about shoulder-width apart. 3. While you keep your back relaxed and keep your hips on the floor, slowly straighten your arms to raise the top half of your body and lift your shoulders. Do not use your back muscles. To make yourself more comfortable, you may change where you place your hands. 4. Stay in this position for 5 seconds. 5. Slowly return to lying flat on the floor. Bridges Do these steps 10 times in a row: 1. Lie on your back on a firm surface. 2. Bend your knees so they point up to the ceiling. Your feet should be flat on the floor. 3. Tighten your butt muscles and lift your butt off of the floor until your waist is almost as high as your knees. If you  do not feel the muscles working in your butt and the back of your thighs, slide your feet 1-2 inches farther away from your butt. 4. Stay in this position for 3-5 seconds. 5. Slowly lower your butt to the floor, and let your butt muscles relax. If this exercise is too easy, try doing it with your arms crossed over your chest. Belly Crunches Do these steps 5-10 times in a row: 1. Lie on your back on a firm bed or the floor with your legs stretched out. 2. Bend your knees so they point up to the ceiling. Your feet should be flat on the floor. 3. Cross your arms over your chest. 4. Tip your chin a little bit toward your chest but do not bend your neck. 5. Tighten your belly muscles and slowly raise your chest just enough to lift your shoulder blades a tiny bit off of the  floor. 6. Slowly lower your chest and your head to the floor. Back Lifts Do these steps 5-10 times in a row: 1. Lie on your belly (face-down) with your arms at your sides, and rest your forehead on the floor. 2. Tighten the muscles in your legs and your butt. 3. Slowly lift your chest off of the floor while you keep your hips on the floor. Keep the back of your head in line with the curve in your back. Look at the floor while you do this. 4. Stay in this position for 3-5 seconds. 5. Slowly lower your chest and your face to the floor. GET HELP IF:  Your back pain gets a lot worse when you do an exercise.  Your back pain does not lessen 2 hours after you exercise. If you have any of these problems, stop doing the exercises. Do not do them again unless your doctor says it is okay. GET HELP RIGHT AWAY IF:  You have sudden, very bad back pain. If this happens, stop doing the exercises. Do not do them again unless your doctor says it is okay.   This information is not intended to replace advice given to you by your health care provider. Make sure you discuss any questions you have with your health care provider.   Document Released: 02/16/2010 Document Revised: 10/05/2014 Document Reviewed: 03/10/2014 Elsevier Interactive Patient Education [employer].
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
## 2015-03-01 NOTE — Telephone Encounter (Signed)
Pt seen in office today.

## 2015-03-03 LAB — URINE CULTURE: Colony Count: 2000

## 2015-03-09 ENCOUNTER — Encounter: Payer: Self-pay | Admitting: *Deleted

## 2015-03-15 ENCOUNTER — Institutional Professional Consult (permissible substitution): Payer: BLUE CROSS/BLUE SHIELD | Admitting: Pulmonary Disease

## 2015-04-10 ENCOUNTER — Emergency Department (HOSPITAL_COMMUNITY)
Admission: EM | Admit: 2015-04-10 | Discharge: 2015-04-10 | Disposition: A | Discharge status: Home / Self Care | Payer: BLUE CROSS/BLUE SHIELD | Attending: Emergency Medicine | Admitting: Emergency Medicine

## 2015-04-10 ENCOUNTER — Encounter (HOSPITAL_COMMUNITY): Payer: Self-pay | Admitting: Family Medicine

## 2015-04-10 ENCOUNTER — Institutional Professional Consult (permissible substitution): Payer: BLUE CROSS/BLUE SHIELD | Admitting: Pulmonary Disease

## 2015-04-10 DIAGNOSIS — G8929 Other chronic pain: Secondary | ICD-10-CM | POA: Diagnosis not present

## 2015-04-10 DIAGNOSIS — Z79899 Other long term (current) drug therapy: Secondary | ICD-10-CM | POA: Insufficient documentation

## 2015-04-10 DIAGNOSIS — Z880 Allergy status to penicillin: Secondary | ICD-10-CM | POA: Diagnosis not present

## 2015-04-10 DIAGNOSIS — R509 Fever, unspecified: Secondary | ICD-10-CM | POA: Diagnosis present

## 2015-04-10 DIAGNOSIS — E079 Disorder of thyroid, unspecified: Secondary | ICD-10-CM | POA: Insufficient documentation

## 2015-04-10 DIAGNOSIS — E119 Type 2 diabetes mellitus without complications: Secondary | ICD-10-CM | POA: Insufficient documentation

## 2015-04-10 DIAGNOSIS — J45901 Unspecified asthma with (acute) exacerbation: Secondary | ICD-10-CM | POA: Diagnosis not present

## 2015-04-10 DIAGNOSIS — M79602 Pain in left arm: Secondary | ICD-10-CM | POA: Insufficient documentation

## 2015-04-10 DIAGNOSIS — B349 Viral infection, unspecified: Secondary | ICD-10-CM | POA: Diagnosis not present

## 2015-04-10 DIAGNOSIS — Z9889 Other specified postprocedural states: Secondary | ICD-10-CM | POA: Diagnosis not present

## 2015-04-10 DIAGNOSIS — Z791 Long term (current) use of non-steroidal anti-inflammatories (NSAID): Secondary | ICD-10-CM | POA: Insufficient documentation

## 2015-04-10 DIAGNOSIS — Z9851 Tubal ligation status: Secondary | ICD-10-CM | POA: Diagnosis not present

## 2015-04-10 NOTE — ED Provider Notes (Signed)
CSN: [id number]     Arrival date & time 04/10/15  1030 History  By signing my name below, I, Dora Sims, attest that this documentation has been prepared under the direction and in the presence of non-physician practitioner, Clyda Greener, PA-C. Electronically Signed: Dora Sims, Scribe. 04/10/2015. 1:13 PM.     Chief Complaint  Patient presents with  . Arm Pain  . URI    The history is provided by the patient. No language interpreter was used.     HPI Comments: [name] is a 45 y.o. female with PMHx of thyroid disease, asthma, and DM who presents to the Emergency Department complaining of sudden onset, constant, URI for the past three days. She has been experiencing SOB at night, mild fever, dry cough, mild rhinorrhea, sore throat, and chest tightness. She notes that she has difficulty talking when she wakes up due to her cold symptoms. She states that people were sick around her home but their symptoms improved. She has been taking Nyquil and Dayquil for her symptoms with no relief. Pt denies pressure with palpation to her face. She denies sinus congestion, chest pain, abdominal pain, nausea, vomiting, diarrhea, ear pain or any other associated symptoms.  Pt additionally complains of left arm pain from her left elbow to her left wrist. 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 She denies any recent injury to her left arm. Pt denies left elbow/arm pain at rest but endorses 7/10 left elbow pain with lifting. Pt is able to bend her left elbow without pain. She has taken ibuprofen 800 mg with no relief. She denies numbness or any other associated symptoms.  Past Medical History  Diagnosis Date  . Diabetes mellitus without complication ([place])   . Asthma   . Thyroid disease   . Chronic daily headache   . Chronic back pain   . Sciatica   . Chronic neck pain    Past  Surgical History  Procedure Laterality Date  . Tubal ligation    . Cholecystectomy    . Cesarean section     Family History  Problem Relation Age of Onset  . Diabetes Mother   . Hypertension Mother   . Thyroid disease Mother    Social History  Substance Use Topics  . Smoking status: Never Smoker   . Smokeless tobacco: None  . Alcohol Use: No   OB History    Gravida Para Term Preterm AB TAB SAB Ectopic Multiple Living   6 4 3 1 2  2   3      Review of Systems   A complete 10 system review of systems was obtained and all systems are negative except as noted in the HPI and PMH.    Allergies  Bactrim and Penicillins  Home Medications   Prior to Admission medications   Medication Sig Start Date End Date Taking? Authorizing Provider  acetaminophen (TYLENOL) 500 MG tablet Take 1,000 mg by mouth every 6 (six) hours as needed for moderate pain.    Historical Provider, MD  albuterol (PROVENTIL HFA;VENTOLIN HFA) 108 (90 Base) MCG/ACT inhaler Inhale 2 puffs into the lungs every 4 (four) hours as needed for wheezing or shortness of breath (cough, shortness of breath or wheezing.). 02/21/15   Shawnee Knapp, MD  cyclobenzaprine (FLEXERIL) 10 MG tablet Take 1 tablet (10 mg total) by mouth 3 (three) times  daily as needed for muscle spasms. When not at work. On work days, just take 1 tab before bed 03/01/15   Shawnee Knapp, MD  diclofenac (VOLTAREN) 75 MG EC tablet Take 1 tablet (75 mg total) by mouth 2 (two) times daily. 03/01/15   Shawnee Knapp, MD  levothyroxine (SYNTHROID, LEVOTHROID) 75 MCG tablet Take 1 tablet (75 mcg total) by mouth daily. 02/24/15   Shawnee Knapp, MD  nitrofurantoin, macrocrystal-monohydrate, (MACROBID) 100 MG capsule Take 1 capsule (100 mg total) by mouth 2 (two) times daily. 02/24/15   Shawnee Knapp, MD  omeprazole (PRILOSEC OTC) 20 MG tablet Take 2 tablets (40 mg total) by mouth daily. 30 minutes before diiner 03/01/15   Shawnee Knapp, MD   BP 138/89 mmHg  Pulse 73  Temp(Src) 98.4 F (36.9  C) (Oral)  Resp 18  SpO2 100%  LMP 03/02/2015   Physical Exam  Constitutional: She is oriented to person, place, and time. She appears well-developed and well-nourished. No distress.  HENT:  Head: Normocephalic and atraumatic.  Eyes: Conjunctivae and EOM are normal. Pupils are equal, round, and reactive to light.  Neck: Normal range of motion. Neck supple. No tracheal deviation present.  Cardiovascular: Normal rate, regular rhythm and normal heart sounds.   Pulmonary/Chest: Effort normal and breath sounds normal. No respiratory distress. She has no wheezes. She has no rales. She exhibits no tenderness.  Musculoskeletal: Normal range of motion.  Left Upper Extremity: - No atrophy - Skin: No abrasions, no lacerations, no ecchymosis - Motor: Full ROM at shoulder, elbow, wrist; 5/5 wrist flexion/extension, thumb, IP joint flexion/extension (AIN/PIN), abduction/adduction (ulnar)   - Sensation intact to median/ulnar/radial nerves - 2+ radial pulse, <2 sec cap refill x 5 digits  Neurological: She is alert and oriented to person, place, and time.  Skin: Skin is warm and dry.  Psychiatric: She has a normal mood and affect. Her behavior is normal.  Nursing note and vitals reviewed.   ED Course  Procedures (including critical care time)  DIAGNOSTIC STUDIES: Oxygen Saturation is 100% on RA, normal by my interpretation.    COORDINATION OF CARE: 1:14 PM Discussed treatment plan with pt at bedside and pt agreed to plan.  Labs Review Labs Reviewed - No data to display  Imaging Review No results found. I have personally reviewed and evaluated these images and lab results as part of my medical decision-making.   EKG Interpretation None      MDM  I have reviewed the relevant previous healthcare records. I obtained HPI from historian.  ED Course:  Assessment: Pt is a 44yF presents with URI symptoms x 2-3 days. As well as left arm pain. On exam, pt in NAD. VSS. Afebrile. Lungs CTA,  Heart RRR. Abdomen nontender/soft. Full ROM of LUE. Non TTP. Pain on lateral aspect of forearm. Most likely muscle strain from lifting heavy objects. Patients symptoms are consistent with URI, likely viral etiology. Discussed that antibiotics are not indicated for viral infections. Pt will be discharged with symptomatic treatment.  Verbalizes understanding and is agreeable with plan. Pt is hemodynamically stable & in NAD prior to dc.  Disposition/Plan:  DC Home Additional Verbal discharge instructions given and discussed with patient.  Pt Instructed to f/u with PCP in the next week for evaluation and treatment of symptoms. Return precautions given Pt acknowledges and agrees with plan  Supervising Physician Orlie Dakin, MD   Final diagnoses:  Viral syndrome  Pain of left upper extremity  I personally performed the services described in this documentation, which was scribed in my presence. The recorded information has been reviewed and is accurate.      Shary Decamp, PA-C 04/10/15 [place], MD 04/10/15 1753

## 2015-04-10 NOTE — ED Notes (Signed)
Declined W/C at D/C and was escorted to lobby by RN. 

## 2015-04-10 NOTE — Discharge Instructions (Signed)
Please read and follow all provided instructions.  Your diagnoses today include:  1. Viral syndrome    You appear to have an upper respiratory infection (URI). An upper respiratory tract infection, or cold, is a viral infection of the air passages leading to the lungs. It should improve gradually after 5-7 days. You may have a lingering cough that lasts for 2- 4 weeks after the infection.  Tests performed today include:  Vital signs. See below for your results today.   Medications prescribed:   Take any prescribed medications only as directed. Treatment for your infection is aimed at treating the symptoms. There are no medications, such as antibiotics, that will cure your infection.   Home care instructions:  Follow any educational materials contained in this packet.   Your illness is contagious and can be spread to others, especially during the first 3 or 4 days. It cannot be cured by antibiotics or other medicines. Take basic precautions such as washing your hands often, covering your mouth when you cough or sneeze, and avoiding public places where you could spread your illness to others.   Please continue drinking plenty of fluids.  Use over-the-counter medicines as needed as directed on packaging for symptom relief.  You may also use ibuprofen or tylenol as directed on packaging for pain or fever.  Do not take multiple medicines containing Tylenol or acetaminophen to avoid taking too much of this medication.  Follow-up instructions: Please follow-up with your primary care provider in the next 3 days for further evaluation of your symptoms if you are not feeling better.   Return instructions:   Please return to the Emergency Department if you experience worsening symptoms.   RETURN IMMEDIATELY IF you develop shortness of breath, confusion or altered mental status, a new rash, become dizzy, faint, or poorly responsive, or are unable to be cared for at home.  Please return if you have  persistent vomiting and cannot keep down fluids or develop a fever that is not controlled by tylenol or motrin.    Please return if you have any other emergent concerns.  Additional Information:  Your vital signs today were: BP 138/89 mmHg   Pulse 73   Temp(Src) 98.4 F (36.9 C) (Oral)   Resp 18   SpO2 100%   LMP 03/02/2015 If your blood pressure (BP) was elevated above 135/85 this visit, please have this repeated by your doctor within one month. --------------

## 2015-04-10 NOTE — ED Notes (Signed)
Pt here for left arm pain from elbow to wrist area. sts hurts to grip or pick anything up. sts also URI symptoms.

## 2015-05-25 ENCOUNTER — Emergency Department (HOSPITAL_COMMUNITY)
Admission: EM | Admit: 2015-05-25 | Discharge: 2015-05-25 | Disposition: A | Discharge status: Home / Self Care | Payer: BLUE CROSS/BLUE SHIELD | Attending: Emergency Medicine | Admitting: Emergency Medicine

## 2015-05-25 ENCOUNTER — Encounter (HOSPITAL_COMMUNITY): Payer: Self-pay | Admitting: Emergency Medicine

## 2015-05-25 DIAGNOSIS — J45909 Unspecified asthma, uncomplicated: Secondary | ICD-10-CM | POA: Diagnosis not present

## 2015-05-25 DIAGNOSIS — E119 Type 2 diabetes mellitus without complications: Secondary | ICD-10-CM | POA: Insufficient documentation

## 2015-05-25 DIAGNOSIS — E669 Obesity, unspecified: Secondary | ICD-10-CM | POA: Diagnosis not present

## 2015-05-25 DIAGNOSIS — J069 Acute upper respiratory infection, unspecified: Secondary | ICD-10-CM | POA: Diagnosis not present

## 2015-05-25 DIAGNOSIS — R05 Cough: Secondary | ICD-10-CM | POA: Diagnosis present

## 2015-05-25 DIAGNOSIS — G8929 Other chronic pain: Secondary | ICD-10-CM | POA: Diagnosis not present

## 2015-05-25 DIAGNOSIS — Z880 Allergy status to penicillin: Secondary | ICD-10-CM | POA: Diagnosis not present

## 2015-05-25 DIAGNOSIS — M5430 Sciatica, unspecified side: Secondary | ICD-10-CM | POA: Diagnosis not present

## 2015-05-25 MED ORDER — ACETAMINOPHEN 500 MG PO TABS: 1000.0000 mg | ORAL_TABLET | Freq: Four times a day (QID) | ORAL | Status: DC | PRN

## 2015-05-25 MED ORDER — ALBUTEROL SULFATE HFA 108 (90 BASE) MCG/ACT IN AERS
2.0000 | INHALATION_SPRAY | Freq: Once | RESPIRATORY_TRACT | Status: AC
  Administered 2015-05-25: 2 via RESPIRATORY_TRACT
  Filled 2015-05-25: qty 6.7

## 2015-05-25 NOTE — ED Notes (Signed)
Patient here with complaints of cough, nasal congestion, URI symptoms x3 days.

## 2015-05-25 NOTE — Discharge Instructions (Signed)
Upper Respiratory Infection, Adult Most upper respiratory infections (URIs) are a viral infection of the air passages leading to the lungs. A URI affects the nose, throat, and upper air passages. The most common type of URI is nasopharyngitis and is typically referred to as "the common cold." URIs run their course and usually go away on their own. Most of the time, a URI does not require medical attention, but sometimes a bacterial infection in the upper airways can follow a viral infection. This is called a secondary infection. Sinus and middle ear infections are common types of secondary upper respiratory infections. Bacterial pneumonia can also complicate a URI. A URI can worsen asthma and chronic obstructive pulmonary disease (COPD). Sometimes, these complications can require emergency medical care and may be life threatening.  CAUSES Almost all URIs are caused by viruses. A virus is a type of germ and can spread from one person to another.  RISKS FACTORS You may be at risk for a URI if:   You smoke.   You have chronic heart or lung disease.  You have a weakened defense (immune) system.   You are very young or very old.   You have nasal allergies or asthma.  You work in crowded or poorly ventilated areas.  You work in health care facilities or schools. SIGNS AND SYMPTOMS  Symptoms typically develop 2-3 days after you come in contact with a cold virus. Most viral URIs last 7-10 days. However, viral URIs from the influenza virus (flu virus) can last 14-18 days and are typically more severe. Symptoms may include:   Runny or stuffy (congested) nose.   Sneezing.   Cough.   Sore throat.   Headache.   Fatigue.   Fever.   Loss of appetite.   Pain in your forehead, behind your eyes, and over your cheekbones (sinus pain).  Muscle aches.  DIAGNOSIS  Your health care provider may diagnose a URI by:  Physical exam.  Tests to check that your symptoms are not due to  another condition such as:  Strep throat.  Sinusitis.  Pneumonia.  Asthma. TREATMENT  A URI goes away on its own with time. It cannot be cured with medicines, but medicines may be prescribed or recommended to relieve symptoms. Medicines may help:  Reduce your fever.  Reduce your cough.  Relieve nasal congestion. HOME CARE INSTRUCTIONS   Take medicines only as directed by your health care provider.   Gargle warm saltwater or take cough drops to comfort your throat as directed by your health care provider.  Use a warm mist humidifier or inhale steam from a shower to increase air moisture. This may make it easier to breathe.  Drink enough fluid to keep your urine clear or pale yellow.   Eat soups and other clear broths and maintain good nutrition.   Rest as needed.   Return to work when your temperature has returned to normal or as your health care provider advises. You may need to stay home longer to avoid infecting others. You can also use a face mask and careful hand washing to prevent spread of the virus.  Increase the usage of your inhaler if you have asthma.   Do not use any tobacco products, including cigarettes, chewing tobacco, or electronic cigarettes. If you need help quitting, ask your health care provider. PREVENTION  The best way to protect yourself from getting a cold is to practice good hygiene.   Avoid oral or hand contact with people with cold   symptoms.   Wash your hands often if contact occurs.  There is no clear evidence that vitamin C, vitamin E, echinacea, or exercise reduces the chance of developing a cold. However, it is always recommended to get plenty of rest, exercise, and practice good nutrition.  SEEK MEDICAL CARE IF:   You are getting worse rather than better.   Your symptoms are not controlled by medicine.   You have chills.  You have worsening shortness of breath.  You have brown or red mucus.  You have yellow or brown nasal  discharge.  You have pain in your face, especially when you bend forward.  You have a fever.  You have swollen neck glands.  You have pain while swallowing.  You have white areas in the back of your throat. SEEK IMMEDIATE MEDICAL CARE IF:   You have severe or persistent:  Headache.  Ear pain.  Sinus pain.  Chest pain.  You have chronic lung disease and any of the following:  Wheezing.  Prolonged cough.  Coughing up blood.  A change in your usual mucus.  You have a stiff neck.  You have changes in your:  Vision.  Hearing.  Thinking.  Mood. MAKE SURE YOU:   Understand these instructions.  Will watch your condition.  Will get help right away if you are not doing well or get worse.   This information is not intended to replace advice given to you by your health care provider. Make sure you discuss any questions you have with your health care provider.   Document Released: 07/10/2000 Document Revised: 05/31/2014 Document Reviewed: 04/21/2013 Elsevier Interactive Patient Education 2016 [employer]  

## 2015-05-25 NOTE — ED Provider Notes (Signed)
CSN: [id number]     Arrival date & time 05/25/15  [id number] History   First MD Initiated Contact with Patient 05/25/15 0901     Chief Complaint  Patient presents with  . Cough  . Nasal Congestion     (Consider location/radiation/quality/duration/timing/severity/associated sxs/prior Treatment) HPI Patient has had sore throat and cough for 3 days. She reports nasal congestion. She reports she had hoarse voice this morning. She has tightness across her back and upper chest. Patient poor she has asthma but ran out of her inhalers. She denies smoking. Past Medical History  Diagnosis Date  . Diabetes mellitus without complication ([place])   . Asthma   . Thyroid disease   . Chronic daily headache   . Chronic back pain   . Sciatica   . Chronic neck pain    Past Surgical History  Procedure Laterality Date  . Tubal ligation    . Cholecystectomy    . Cesarean section     Family History  Problem Relation Age of Onset  . Diabetes Mother   . Hypertension Mother   . Thyroid disease Mother    Social History  Substance Use Topics  . Smoking status: Never Smoker   . Smokeless tobacco: None  . Alcohol Use: No   OB History    Gravida Para Term Preterm AB TAB SAB Ectopic Multiple Living   6 4 3 1 2  2   3      Review of Systems  10 Systems reviewed and are negative for acute change except as noted in the HPI.   Allergies  Bactrim and Penicillins  Home Medications   Prior to Admission medications   Medication Sig Start Date End Date Taking? Authorizing Provider  ibuprofen (ADVIL,MOTRIN) 200 MG tablet Take 800-1,000 mg by mouth every 6 (six) hours as needed for headache.   Yes Historical Provider, MD  acetaminophen (TYLENOL) 500 MG tablet Take 2 tablets (1,000 mg total) by mouth every 6 (six) hours as needed. 05/25/15   Charlesetta Shanks, MD  albuterol (PROVENTIL HFA;VENTOLIN HFA) 108 (90 Base) MCG/ACT inhaler Inhale 2 puffs into the lungs every 4 (four) hours as needed for wheezing or  shortness of breath (cough, shortness of breath or wheezing.). Patient not taking: Reported on 05/25/2015 02/21/15   Shawnee Knapp, MD  cyclobenzaprine (FLEXERIL) 10 MG tablet Take 1 tablet (10 mg total) by mouth 3 (three) times daily as needed for muscle spasms. When not at work. On work days, just take 1 tab before bed Patient not taking: Reported on 05/25/2015 03/01/15   Shawnee Knapp, MD  diclofenac (VOLTAREN) 75 MG EC tablet Take 1 tablet (75 mg total) by mouth 2 (two) times daily. Patient not taking: Reported on 05/25/2015 03/01/15   Shawnee Knapp, MD  levothyroxine (SYNTHROID, LEVOTHROID) 75 MCG tablet Take 1 tablet (75 mcg total) by mouth daily. Patient not taking: Reported on 05/25/2015 02/24/15   Shawnee Knapp, MD  nitrofurantoin, macrocrystal-monohydrate, (MACROBID) 100 MG capsule Take 1 capsule (100 mg total) by mouth 2 (two) times daily. Patient not taking: Reported on 05/25/2015 02/24/15   Shawnee Knapp, MD  omeprazole (PRILOSEC OTC) 20 MG tablet Take 2 tablets (40 mg total) by mouth daily. 30 minutes before diiner Patient not taking: Reported on 05/25/2015 03/01/15   Shawnee Knapp, MD   BP 132/71 mmHg  Pulse 75  Temp(Src) 98.9 F (37.2 C) (Oral)  Resp 19  SpO2 99% Physical Exam  Constitutional: She is oriented to person,  place, and time.  Patient is obese. She is alert and nontoxic. No respiratory distress. Well appearance.  HENT:  Head: Normocephalic and atraumatic.  Right Ear: External ear normal.  Left Ear: External ear normal.  Bilateral TMs are normal. Posterior oropharynx is widely patent. No erythema of the tonsillar pillars. No exudate. Normal posterior oropharynx.  Eyes: EOM are normal. Pupils are equal, round, and reactive to light.  Neck: Neck supple.  Cardiovascular: Normal rate, regular rhythm, normal heart sounds and intact distal pulses.   Pulmonary/Chest: Effort normal and breath sounds normal.  Occasional cough with deep inspiration. Normal airflow. No wheezes.  Abdominal: Soft. Bowel  sounds are normal. She exhibits no distension. There is no tenderness.  Musculoskeletal: Normal range of motion. She exhibits no edema or tenderness.  Lymphadenopathy:    She has no cervical adenopathy.  Neurological: She is alert and oriented to person, place, and time. She has normal strength. Coordination normal. GCS eye subscore is 4. GCS verbal subscore is 5. GCS motor subscore is 6.  Skin: Skin is warm, dry and intact.  Psychiatric: She has a normal mood and affect.    ED Course  Procedures (including critical care time) Labs Review Labs Reviewed - No data to display  Imaging Review No results found. I have personally reviewed and evaluated these images and lab results as part of my medical decision-making.   EKG Interpretation None      MDM   Final diagnoses:  URI (upper respiratory infection)   Patient presents with symptoms consistent with URI. Patient has history of asthma and is out of inhalers. At this time she does not appear to be having significant asthma exacerbation. There is no wheezing and no respiratory distress. Patient will be given an inhaler and advised to take acetaminophen for fever chills or body ache. Patient requests a 1 day work note.    Charlesetta Shanks, MD 05/25/15 [phone]

## 2015-08-04 ENCOUNTER — Emergency Department (HOSPITAL_COMMUNITY): Payer: BLUE CROSS/BLUE SHIELD

## 2015-08-04 ENCOUNTER — Encounter (HOSPITAL_COMMUNITY): Payer: Self-pay | Admitting: Emergency Medicine

## 2015-08-04 ENCOUNTER — Emergency Department (HOSPITAL_COMMUNITY)
Admission: EM | Admit: 2015-08-04 | Discharge: 2015-08-04 | Disposition: A | Discharge status: Home / Self Care | Payer: BLUE CROSS/BLUE SHIELD | Attending: Emergency Medicine | Admitting: Emergency Medicine

## 2015-08-04 DIAGNOSIS — J45909 Unspecified asthma, uncomplicated: Secondary | ICD-10-CM | POA: Diagnosis not present

## 2015-08-04 DIAGNOSIS — E119 Type 2 diabetes mellitus without complications: Secondary | ICD-10-CM | POA: Diagnosis not present

## 2015-08-04 DIAGNOSIS — Z7951 Long term (current) use of inhaled steroids: Secondary | ICD-10-CM | POA: Insufficient documentation

## 2015-08-04 DIAGNOSIS — R5382 Chronic fatigue, unspecified: Secondary | ICD-10-CM

## 2015-08-04 DIAGNOSIS — E039 Hypothyroidism, unspecified: Secondary | ICD-10-CM | POA: Diagnosis not present

## 2015-08-04 DIAGNOSIS — R5383 Other fatigue: Secondary | ICD-10-CM | POA: Diagnosis present

## 2015-08-04 LAB — COMPREHENSIVE METABOLIC PANEL
ALBUMIN: 3.2 g/dL — AB (ref 3.5–5.0)
ALK PHOS: 78 U/L (ref 38–126)
ALT: 14 U/L (ref 14–54)
ANION GAP: 6 (ref 5–15)
AST: 14 U/L — ABNORMAL LOW (ref 15–41)
BILIRUBIN TOTAL: 0.4 mg/dL (ref 0.3–1.2)
BUN: 14 mg/dL (ref 6–20)
CALCIUM: 7.9 mg/dL — AB (ref 8.9–10.3)
CO2: 25 mmol/L (ref 22–32)
Chloride: 105 mmol/L (ref 101–111)
Creatinine, Ser: 0.62 mg/dL (ref 0.44–1.00)
GFR calc non Af Amer: >60 mL/min (ref >60)
GLUCOSE: 127 mg/dL — AB (ref 65–99)
Potassium: 4 mmol/L (ref 3.5–5.1)
Sodium: 136 mmol/L (ref 135–145)
TOTAL PROTEIN: 7.2 g/dL (ref 6.5–8.1)

## 2015-08-04 LAB — CBC WITH DIFFERENTIAL/PLATELET
Basophils Absolute: 0 10*3/uL (ref 0.0–0.1)
Basophils Relative: 0 %
Eosinophils Absolute: 0.2 10*3/uL (ref 0.0–0.7)
Eosinophils Relative: 2 %
HEMATOCRIT: 31.5 % — AB (ref 36.0–46.0)
HEMOGLOBIN: 10.1 g/dL — AB (ref 12.0–15.0)
LYMPHS ABS: 2.5 10*3/uL (ref 0.7–4.0)
LYMPHS PCT: 29 %
MCH: 27 pg (ref 26.0–34.0)
MCHC: 32.1 g/dL (ref 30.0–36.0)
MCV: 84.2 fL (ref 78.0–100.0)
MONOS PCT: 9 %
Monocytes Absolute: 0.7 10*3/uL (ref 0.1–1.0)
NEUTROS ABS: 5.2 10*3/uL (ref 1.7–7.7)
NEUTROS PCT: 60 %
Platelets: 366 10*3/uL (ref 150–400)
RBC: 3.74 MIL/uL — ABNORMAL LOW (ref 3.87–5.11)
RDW: 15.4 % (ref 11.5–15.5)
WBC: 8.6 10*3/uL (ref 4.0–10.5)

## 2015-08-04 LAB — I-STAT BETA HCG BLOOD, ED (MC, WL, AP ONLY): I-stat hCG, quantitative: <5 m[IU]/mL (ref <5)

## 2015-08-04 LAB — TSH: TSH: 5.028 u[IU]/mL — ABNORMAL HIGH (ref 0.350–4.500)

## 2015-08-04 MED ORDER — LEVOTHYROXINE SODIUM 75 MCG PO TABS: 75.0000 ug | ORAL_TABLET | Freq: Every day | ORAL | Status: DC

## 2015-08-04 MED ORDER — SODIUM CHLORIDE 0.9 % IV BOLUS (SEPSIS)
1000.0000 mL | Freq: Once | INTRAVENOUS | Status: AC
  Administered 2015-08-04: 1000 mL via INTRAVENOUS

## 2015-08-04 NOTE — Discharge Instructions (Signed)

## 2015-08-04 NOTE — ED Notes (Signed)
1 failed attempt to collect blood.

## 2015-08-04 NOTE — ED Provider Notes (Signed)
CSN: [id number]     Arrival date & time 08/04/15  1029 History   First MD Initiated Contact with Patient 08/04/15 1041     Chief Complaint  Patient presents with  . Fatigue     (Consider location/radiation/quality/duration/timing/severity/associated sxs/prior Treatment) Patient is a 45 y.o. female presenting with general illness. The history is provided by the patient.  Illness Severity:  Mild Onset quality:  Gradual Duration:  1 month Timing:  Constant Progression:  Worsening Chronicity:  New Associated symptoms: fatigue and fever   Associated symptoms: no chest pain, no congestion, no headaches, no myalgias, no nausea, no rhinorrhea, no shortness of breath, no vomiting and no wheezing    45 yo F With generalized fatigue for a month. This is unchanged. She is here today because she had a fever yesterday of 103. She's had a mild cough but denies other symptoms. Is unable to further quantify her tiredness. She feels like she is sleeping all the time. She has not been taking her thyroid medication for at least a couple months. She has been without this because she has not followed up with her family doctor.  Past Medical History  Diagnosis Date  . Diabetes mellitus without complication ([place])   . Asthma   . Thyroid disease   . Chronic daily headache   . Chronic back pain   . Sciatica   . Chronic neck pain    Past Surgical History  Procedure Laterality Date  . Tubal ligation    . Cholecystectomy    . Cesarean section     Family History  Problem Relation Age of Onset  . Diabetes Mother   . Hypertension Mother   . Thyroid disease Mother    Social History  Substance Use Topics  . Smoking status: Never Smoker   . Smokeless tobacco: None  . Alcohol Use: No   OB History    Gravida Para Term Preterm AB TAB SAB Ectopic Multiple Living   6 4 3 1 2  2   3      Review of Systems  Constitutional: Positive for fever and fatigue. Negative for chills.  HENT: Negative for  congestion and rhinorrhea.   Eyes: Negative for redness and visual disturbance.  Respiratory: Negative for shortness of breath and wheezing.   Cardiovascular: Negative for chest pain and palpitations.  Gastrointestinal: Negative for nausea and vomiting.  Genitourinary: Negative for dysuria and urgency.  Musculoskeletal: Negative for myalgias and arthralgias.  Skin: Negative for pallor and wound.  Neurological: Negative for dizziness and headaches.      Allergies  Bactrim and Penicillins  Home Medications   Prior to Admission medications   Medication Sig Start Date End Date Taking? Authorizing Provider  acetaminophen (TYLENOL) 500 MG tablet Take 2 tablets (1,000 mg total) by mouth every 6 (six) hours as needed. Patient taking differently: Take 1,000 mg by mouth every 6 (six) hours as needed for moderate pain or headache.  05/25/15  Yes Charlesetta Shanks, MD  albuterol (PROVENTIL HFA;VENTOLIN HFA) 108 (90 Base) MCG/ACT inhaler Inhale 2 puffs into the lungs every 4 (four) hours as needed for wheezing or shortness of breath (cough, shortness of breath or wheezing.). 02/21/15  Yes Shawnee Knapp, MD  ibuprofen (ADVIL,MOTRIN) 200 MG tablet Take 800-1,000 mg by mouth every 6 (six) hours as needed for headache.   Yes Historical Provider, MD  cyclobenzaprine (FLEXERIL) 10 MG tablet Take 1 tablet (10 mg total) by mouth 3 (three) times daily as needed for muscle spasms.  When not at work. On work days, just take 1 tab before bed Patient not taking: Reported on 05/25/2015 03/01/15   Shawnee Knapp, MD  diclofenac (VOLTAREN) 75 MG EC tablet Take 1 tablet (75 mg total) by mouth 2 (two) times daily. Patient not taking: Reported on 05/25/2015 03/01/15   Shawnee Knapp, MD  levothyroxine (SYNTHROID, LEVOTHROID) 75 MCG tablet Take 1 tablet (75 mcg total) by mouth daily. 08/04/15   Deno Etienne, DO  omeprazole (PRILOSEC OTC) 20 MG tablet Take 2 tablets (40 mg total) by mouth daily. 30 minutes before diiner Patient not taking:  Reported on 05/25/2015 03/01/15   Shawnee Knapp, MD  triamterene-hydrochlorothiazide (MAXZIDE-25) 37.5-25 MG tablet Take 1 tablet by mouth daily. 07/20/15   Historical Provider, MD   BP 126/76 mmHg  Pulse 80  Temp(Src) 98.8 F (37.1 C) (Oral)  Resp 18  SpO2 100%  LMP 07/28/2015 Physical Exam  Constitutional: She is oriented to person, place, and time. She appears well-developed and well-nourished. No distress.  obese  HENT:  Head: Normocephalic and atraumatic.  Eyes: EOM are normal. Pupils are equal, round, and reactive to light.  Neck: Normal range of motion. Neck supple.  Cardiovascular: Normal rate and regular rhythm.  Exam reveals no gallop and no friction rub.   No murmur heard. Pulmonary/Chest: Effort normal. She has no wheezes. She has no rales.  Abdominal: Soft. She exhibits no distension. There is no tenderness.  Musculoskeletal: She exhibits no edema or tenderness.  Neurological: She is alert and oriented to person, place, and time.  Skin: Skin is warm and dry. She is not diaphoretic.  Psychiatric: She has a normal mood and affect. Her behavior is normal.  Nursing note and vitals reviewed.   ED Course  Procedures (including critical care time) Labs Review Labs Reviewed  CBC WITH DIFFERENTIAL/PLATELET - Abnormal; Notable for the following:    RBC 3.74 (*)    Hemoglobin 10.1 (*)    HCT 31.5 (*)    All other components within normal limits  COMPREHENSIVE METABOLIC PANEL - Abnormal; Notable for the following:    Glucose, Bld 127 (*)    Calcium 7.9 (*)    Albumin 3.2 (*)    AST 14 (*)    All other components within normal limits  TSH - Abnormal; Notable for the following:    TSH 5.028 (*)    All other components within normal limits  T4  I-STAT BETA HCG BLOOD, ED ([facility], [facility], [facility] ONLY)    Imaging Review Dg Chest 2 View  08/04/2015  CLINICAL DATA:  Cough.  Weakness.  Symptoms of 4 weeks duration. EXAM: CHEST  2 VIEW COMPARISON:  02/21/2015 FINDINGS: Heart size is normal.  Mediastinal shadows are normal. The left lung is clear. There is segmental bronchopneumonia in the right upper lobe. No collapse. No effusion. Ordinary degenerative changes affect the spine. IMPRESSION: Segmental bronchopneumonia in the right upper lobe. Electronically Signed   By: Nelson Chimes M.D.   On: 08/04/2015 12:14   I have personally reviewed and evaluated these images and lab results as part of my medical decision-making.   EKG Interpretation None      MDM   Final diagnoses:  Chronic fatigue  Hypothyroidism, unspecified hypothyroidism type    45 yo F with generalized fatigue. This been going on for the past month. This could be due to her not taking her thyroid medication. Patient was febrile yesterday but afebrile here in the ED. Will obtain a chest  x-ray to evaluate for possible pneumonia. Basic laboratory evaluation.  Hypothyroid.  Will start back on her medication.  PCP follow up.   1:29 PM:  I have discussed the diagnosis/risks/treatment options with the patient and family and believe the pt to be eligible for discharge home to follow-up with PCP. We also discussed returning to the ED immediately if new or worsening sx occur. We discussed the sx which are most concerning (e.g., sudden worsening pain, fever, inability to tolerate by mouth) that necessitate immediate return. Medications administered to the patient during their visit and any new prescriptions provided to the patient are listed below.  Medications given during this visit Medications  sodium chloride 0.9 % bolus 1,000 mL (0 mLs Intravenous Stopped 08/04/15 1256)    Discharge Medication List as of 08/04/2015 12:51 PM      The patient appears reasonably screen and/or stabilized for discharge and I doubt any other medical condition or other [facility] requiring further screening, evaluation, or treatment in the ED at this time prior to discharge.       Deno Etienne, DO 08/04/15 1329

## 2015-08-04 NOTE — ED Notes (Signed)
Pt c/o fatigue, dry cough and just "doesn't feel well" for a few weeks. Pt states she had a fever of 103 last night, took 800 mg of ibuprofen. Current temp 98.8. Pt adds she has not been taking her thyroid medication for "a good 6 months."

## 2015-08-05 ENCOUNTER — Encounter (HOSPITAL_COMMUNITY): Payer: Self-pay

## 2015-08-05 ENCOUNTER — Emergency Department (HOSPITAL_COMMUNITY): Payer: BLUE CROSS/BLUE SHIELD

## 2015-08-05 ENCOUNTER — Emergency Department (HOSPITAL_COMMUNITY)
Admission: EM | Admit: 2015-08-05 | Discharge: 2015-08-05 | Disposition: A | Discharge status: Home / Self Care | Payer: BLUE CROSS/BLUE SHIELD | Source: Home / Self Care | Attending: Emergency Medicine | Admitting: Emergency Medicine

## 2015-08-05 DIAGNOSIS — R509 Fever, unspecified: Secondary | ICD-10-CM | POA: Diagnosis not present

## 2015-08-05 DIAGNOSIS — A419 Sepsis, unspecified organism: Secondary | ICD-10-CM | POA: Diagnosis not present

## 2015-08-05 DIAGNOSIS — E039 Hypothyroidism, unspecified: Secondary | ICD-10-CM | POA: Insufficient documentation

## 2015-08-05 DIAGNOSIS — Z79899 Other long term (current) drug therapy: Secondary | ICD-10-CM | POA: Insufficient documentation

## 2015-08-05 DIAGNOSIS — J45909 Unspecified asthma, uncomplicated: Secondary | ICD-10-CM

## 2015-08-05 DIAGNOSIS — J189 Pneumonia, unspecified organism: Secondary | ICD-10-CM | POA: Insufficient documentation

## 2015-08-05 DIAGNOSIS — E119 Type 2 diabetes mellitus without complications: Secondary | ICD-10-CM | POA: Insufficient documentation

## 2015-08-05 LAB — T4: T4 TOTAL: 8.8 ug/dL (ref 4.5–12.0)

## 2015-08-05 MED ORDER — LEVOFLOXACIN 500 MG PO TABS
500.0000 mg | ORAL_TABLET | Freq: Once | ORAL | Status: AC
  Administered 2015-08-05: 500 mg via ORAL
  Filled 2015-08-05: qty 1

## 2015-08-05 MED ORDER — LEVOFLOXACIN 500 MG PO TABS: 500.0000 mg | ORAL_TABLET | Freq: Every day | ORAL | Status: AC

## 2015-08-05 MED ORDER — ACETAMINOPHEN 325 MG PO TABS
650.0000 mg | ORAL_TABLET | Freq: Once | ORAL | Status: AC | PRN
  Administered 2015-08-05: 650 mg via ORAL
  Filled 2015-08-05: qty 2

## 2015-08-05 NOTE — ED Provider Notes (Signed)
CSN: [id number]     Arrival date & time 08/05/15  0620 History   First MD Initiated Contact with Patient 08/05/15 0701     Chief Complaint  Patient presents with  . Fever  . Back Pain  . Shortness of Breath     (Consider location/radiation/quality/duration/timing/severity/associated sxs/prior Treatment) HPI Patient presents for the second time in 2 days with concern of fever, fatigue, back pain. Patient notes that since discharge she has had recurrent fever, has not taken any new medication for control of her fever. She also complains of ongoing pain between the scapula. Now she also complains of pain with inspiration, dyspnea with inspiration, as well as coughing. She denies new other changes, including urinary complaints, vomiting, confusion, disorientation. Patient was advised yesterday that she needs to restart her thyroid medication. She has not yet done so.  Past Medical History  Diagnosis Date  . Diabetes mellitus without complication ([place])   . Asthma   . Thyroid disease   . Chronic daily headache   . Chronic back pain   . Sciatica   . Chronic neck pain    Past Surgical History  Procedure Laterality Date  . Tubal ligation    . Cholecystectomy    . Cesarean section     Family History  Problem Relation Age of Onset  . Diabetes Mother   . Hypertension Mother   . Thyroid disease Mother    Social History  Substance Use Topics  . Smoking status: Never Smoker   . Smokeless tobacco: None  . Alcohol Use: No   OB History    Gravida Para Term Preterm AB TAB SAB Ectopic Multiple Living   6 4 3 1 2  2   3      Review of Systems  Constitutional:       Per HPI, otherwise negative  HENT:       Per HPI, otherwise negative  Respiratory:       Per HPI, otherwise negative  Cardiovascular:       Per HPI, otherwise negative  Gastrointestinal: Negative for vomiting.  Endocrine:       Negative aside from HPI  Genitourinary:       Neg aside from HPI   Musculoskeletal:        Per HPI, otherwise negative  Skin: Negative.   Neurological: Negative for syncope.      Allergies  Bactrim and Penicillins  Home Medications   Prior to Admission medications   Medication Sig Start Date End Date Taking? Authorizing Provider  acetaminophen (TYLENOL) 500 MG tablet Take 2 tablets (1,000 mg total) by mouth every 6 (six) hours as needed. Patient taking differently: Take 1,000 mg by mouth every 6 (six) hours as needed for moderate pain or headache.  05/25/15   Charlesetta Shanks, MD  albuterol (PROVENTIL HFA;VENTOLIN HFA) 108 (90 Base) MCG/ACT inhaler Inhale 2 puffs into the lungs every 4 (four) hours as needed for wheezing or shortness of breath (cough, shortness of breath or wheezing.). 02/21/15   Shawnee Knapp, MD  cyclobenzaprine (FLEXERIL) 10 MG tablet Take 1 tablet (10 mg total) by mouth 3 (three) times daily as needed for muscle spasms. When not at work. On work days, just take 1 tab before bed Patient not taking: Reported on 05/25/2015 03/01/15   Shawnee Knapp, MD  diclofenac (VOLTAREN) 75 MG EC tablet Take 1 tablet (75 mg total) by mouth 2 (two) times daily. Patient not taking: Reported on 05/25/2015 03/01/15   Laurey Arrow  Brigitte Pulse, MD  ibuprofen (ADVIL,MOTRIN) 200 MG tablet Take 800-1,000 mg by mouth every 6 (six) hours as needed for headache.    Historical Provider, MD  levothyroxine (SYNTHROID, LEVOTHROID) 75 MCG tablet Take 1 tablet (75 mcg total) by mouth daily. 08/04/15   Deno Etienne, DO  omeprazole (PRILOSEC OTC) 20 MG tablet Take 2 tablets (40 mg total) by mouth daily. 30 minutes before diiner Patient not taking: Reported on 05/25/2015 03/01/15   Shawnee Knapp, MD  triamterene-hydrochlorothiazide (MAXZIDE-25) 37.5-25 MG tablet Take 1 tablet by mouth daily. 07/20/15   Historical Provider, MD   BP 132/84 mmHg  Pulse 120  Temp(Src) 102.5 F (39.2 C) (Oral)  Resp 24  Ht 5\' 2"  (1.575 m)  Wt 212 lb (96.163 kg)  BMI 38.77 kg/m2  SpO2 97%  LMP 07/28/2015 Physical Exam  Constitutional: She is  oriented to person, place, and time. She appears well-developed and well-nourished. No distress.  HENT:  Head: Normocephalic and atraumatic.  Eyes: Conjunctivae and EOM are normal.  Cardiovascular: Regular rhythm.  Tachycardia present.   Pulmonary/Chest: Effort normal and breath sounds normal. No stridor. No respiratory distress.  Abdominal: She exhibits no distension.  Musculoskeletal: She exhibits no edema.  Neurological: She is alert and oriented to person, place, and time. No cranial nerve deficit.  Skin: Skin is warm and dry.  Psychiatric: She has a normal mood and affect.  Nursing note and vitals reviewed.   ED Course  Procedures (including critical care time)  Imaging Review Dg Chest 2 View  08/04/2015  CLINICAL DATA:  Cough.  Weakness.  Symptoms of 4 weeks duration. EXAM: CHEST  2 VIEW COMPARISON:  02/21/2015 FINDINGS: Heart size is normal. Mediastinal shadows are normal. The left lung is clear. There is segmental bronchopneumonia in the right upper lobe. No collapse. No effusion. Ordinary degenerative changes affect the spine. IMPRESSION: Segmental bronchopneumonia in the right upper lobe. Electronically Signed   By: Nelson Chimes M.D.   On: 08/04/2015 12:14   I have personally reviewed and evaluated these images and lab results as part of my medical decision-making.  I reviewed yesterday's emergency department visit notes, including largely reassuring labs, no fever in the emergency department, evidence for pneumonia noted on x-ray. Patient has not yet started antibiotics yet either.  On repeat exam the patient remains in similar condition. She is aware of all results. She has received initial antibiotics.   MDM   Patient presents with ongoing fever, weakness. Patient has history of hypothyroidism, no evidence for decompensated state. Patient has evidence for pneumonia on x-ray performed yesterday. With persistent fever, weakness, the patient received antibiotics here, was  discharged after labs were otherwise reassuring.   Carmin Muskrat, MD 08/05/15 [phone]

## 2015-08-05 NOTE — Discharge Instructions (Signed)

## 2015-08-05 NOTE — ED Notes (Addendum)
Patient states that is having pain in her back that stays there.  Pain began last night about 2300.  Patient states that thought she heard wheezing when she was laying in bed last night.   Patient states that has had a dry cough and running fever since yesterday.  Patient temp in triage 102.5.  Patient states that took ibuprofen at 2000 last night.   Patient rates pain 8/10.

## 2015-08-06 ENCOUNTER — Inpatient Hospital Stay (HOSPITAL_COMMUNITY)
Admission: EM | Admit: 2015-08-06 | Discharge: 2015-08-11 | DRG: 871 | Disposition: A | Discharge status: Home / Self Care | Payer: BLUE CROSS/BLUE SHIELD | Attending: Internal Medicine | Admitting: Internal Medicine

## 2015-08-06 ENCOUNTER — Encounter (HOSPITAL_COMMUNITY): Payer: Self-pay | Admitting: *Deleted

## 2015-08-06 DIAGNOSIS — Z79899 Other long term (current) drug therapy: Secondary | ICD-10-CM

## 2015-08-06 DIAGNOSIS — E119 Type 2 diabetes mellitus without complications: Secondary | ICD-10-CM | POA: Diagnosis present

## 2015-08-06 DIAGNOSIS — J189 Pneumonia, unspecified organism: Secondary | ICD-10-CM

## 2015-08-06 DIAGNOSIS — Z6838 Body mass index (BMI) 38.0-38.9, adult: Secondary | ICD-10-CM

## 2015-08-06 DIAGNOSIS — Z833 Family history of diabetes mellitus: Secondary | ICD-10-CM

## 2015-08-06 DIAGNOSIS — J45909 Unspecified asthma, uncomplicated: Secondary | ICD-10-CM | POA: Diagnosis present

## 2015-08-06 DIAGNOSIS — D509 Iron deficiency anemia, unspecified: Secondary | ICD-10-CM | POA: Diagnosis present

## 2015-08-06 DIAGNOSIS — R600 Localized edema: Secondary | ICD-10-CM | POA: Diagnosis present

## 2015-08-06 DIAGNOSIS — R000 Tachycardia, unspecified: Secondary | ICD-10-CM | POA: Diagnosis present

## 2015-08-06 DIAGNOSIS — D259 Leiomyoma of uterus, unspecified: Secondary | ICD-10-CM | POA: Diagnosis present

## 2015-08-06 DIAGNOSIS — E038 Other specified hypothyroidism: Secondary | ICD-10-CM | POA: Diagnosis not present

## 2015-08-06 DIAGNOSIS — A419 Sepsis, unspecified organism: Principal | ICD-10-CM | POA: Diagnosis present

## 2015-08-06 DIAGNOSIS — N920 Excessive and frequent menstruation with regular cycle: Secondary | ICD-10-CM

## 2015-08-06 DIAGNOSIS — G8929 Other chronic pain: Secondary | ICD-10-CM | POA: Diagnosis present

## 2015-08-06 DIAGNOSIS — I10 Essential (primary) hypertension: Secondary | ICD-10-CM | POA: Diagnosis not present

## 2015-08-06 DIAGNOSIS — R509 Fever, unspecified: Secondary | ICD-10-CM

## 2015-08-06 DIAGNOSIS — Z9049 Acquired absence of other specified parts of digestive tract: Secondary | ICD-10-CM

## 2015-08-06 DIAGNOSIS — M549 Dorsalgia, unspecified: Secondary | ICD-10-CM | POA: Diagnosis present

## 2015-08-06 DIAGNOSIS — E039 Hypothyroidism, unspecified: Secondary | ICD-10-CM

## 2015-08-06 DIAGNOSIS — M5430 Sciatica, unspecified side: Secondary | ICD-10-CM | POA: Diagnosis present

## 2015-08-06 DIAGNOSIS — R51 Headache: Secondary | ICD-10-CM | POA: Diagnosis present

## 2015-08-06 DIAGNOSIS — Z8249 Family history of ischemic heart disease and other diseases of the circulatory system: Secondary | ICD-10-CM

## 2015-08-06 DIAGNOSIS — E669 Obesity, unspecified: Secondary | ICD-10-CM | POA: Diagnosis present

## 2015-08-06 LAB — I-STAT CHEM 8, ED
BUN: 8 mg/dL (ref 6–20)
CALCIUM ION: 1.08 mmol/L — AB (ref 1.13–1.30)
CHLORIDE: 102 mmol/L (ref 101–111)
CREATININE: 0.7 mg/dL (ref 0.44–1.00)
GLUCOSE: 147 mg/dL — AB (ref 65–99)
HCT: 29 % — ABNORMAL LOW (ref 36.0–46.0)
HEMOGLOBIN: 9.9 g/dL — AB (ref 12.0–15.0)
POTASSIUM: 4 mmol/L (ref 3.5–5.1)
Sodium: 138 mmol/L (ref 135–145)
TCO2: 26 mmol/L (ref 0–100)

## 2015-08-06 LAB — CBC WITH DIFFERENTIAL/PLATELET
Basophils Absolute: 0 10*3/uL (ref 0.0–0.1)
Basophils Relative: 0 %
EOS PCT: 1 %
Eosinophils Absolute: 0.1 10*3/uL (ref 0.0–0.7)
HEMATOCRIT: 28.8 % — AB (ref 36.0–46.0)
Hemoglobin: 9.5 g/dL — ABNORMAL LOW (ref 12.0–15.0)
LYMPHS ABS: 1.5 10*3/uL (ref 0.7–4.0)
LYMPHS PCT: 20 %
MCH: 26.9 pg (ref 26.0–34.0)
MCHC: 33 g/dL (ref 30.0–36.0)
MCV: 81.6 fL (ref 78.0–100.0)
MONO ABS: 0.8 10*3/uL (ref 0.1–1.0)
Monocytes Relative: 10 %
NEUTROS ABS: 5.1 10*3/uL (ref 1.7–7.7)
Neutrophils Relative %: 69 %
PLATELETS: 358 10*3/uL (ref 150–400)
RBC: 3.53 MIL/uL — ABNORMAL LOW (ref 3.87–5.11)
RDW: 15.3 % (ref 11.5–15.5)
WBC: 7.4 10*3/uL (ref 4.0–10.5)

## 2015-08-06 LAB — I-STAT CG4 LACTIC ACID, ED: LACTIC ACID, VENOUS: 1.32 mmol/L (ref 0.5–1.9)

## 2015-08-06 MED ORDER — DEXTROSE 5 % IV SOLN
1.0000 g | Freq: Every day | INTRAVENOUS | Status: DC
  Administered 2015-08-07 – 2015-08-10 (×5): 1 g via INTRAVENOUS
  Filled 2015-08-06 (×6): qty 10

## 2015-08-06 MED ORDER — HYDRALAZINE HCL 20 MG/ML IJ SOLN: 10.0000 mg | INTRAMUSCULAR | Status: DC | PRN

## 2015-08-06 MED ORDER — LEVOTHYROXINE SODIUM 75 MCG PO TABS
75.0000 ug | ORAL_TABLET | Freq: Every day | ORAL | Status: DC
  Administered 2015-08-07 – 2015-08-11 (×5): 75 ug via ORAL
  Filled 2015-08-06 (×5): qty 1

## 2015-08-06 MED ORDER — ACETAMINOPHEN 325 MG PO TABS
650.0000 mg | ORAL_TABLET | Freq: Four times a day (QID) | ORAL | Status: DC | PRN
  Administered 2015-08-07 – 2015-08-10 (×5): 650 mg via ORAL
  Filled 2015-08-06 (×5): qty 2

## 2015-08-06 MED ORDER — AZITHROMYCIN 250 MG PO TABS
500.0000 mg | ORAL_TABLET | Freq: Every day | ORAL | Status: DC
  Administered 2015-08-07 – 2015-08-10 (×5): 500 mg via ORAL
  Filled 2015-08-06 (×5): qty 2

## 2015-08-06 MED ORDER — FENTANYL CITRATE (PF) 100 MCG/2ML IJ SOLN
100.0000 ug | Freq: Once | INTRAMUSCULAR | Status: AC
  Administered 2015-08-06: 100 ug via INTRAVENOUS
  Filled 2015-08-06: qty 2

## 2015-08-06 MED ORDER — HYDROCODONE-ACETAMINOPHEN 5-325 MG PO TABS
1.0000 | ORAL_TABLET | ORAL | Status: DC | PRN
  Administered 2015-08-07: 1 via ORAL
  Administered 2015-08-07: 2 via ORAL
  Filled 2015-08-06: qty 1
  Filled 2015-08-06: qty 2

## 2015-08-06 MED ORDER — ENOXAPARIN SODIUM 40 MG/0.4ML ~~LOC~~ SOLN
40.0000 mg | SUBCUTANEOUS | Status: DC
  Administered 2015-08-07 – 2015-08-09 (×3): 40 mg via SUBCUTANEOUS
  Filled 2015-08-06 (×4): qty 0.4

## 2015-08-06 MED ORDER — ONDANSETRON HCL 4 MG/2ML IJ SOLN: 4.0000 mg | Freq: Four times a day (QID) | INTRAMUSCULAR | Status: DC | PRN

## 2015-08-06 MED ORDER — SODIUM CHLORIDE 0.9 % IV BOLUS (SEPSIS)
1000.0000 mL | Freq: Once | INTRAVENOUS | Status: AC
  Administered 2015-08-06: 1000 mL via INTRAVENOUS

## 2015-08-06 MED ORDER — ALBUTEROL SULFATE (2.5 MG/3ML) 0.083% IN NEBU: 2.5000 mg | INHALATION_SOLUTION | RESPIRATORY_TRACT | Status: DC | PRN

## 2015-08-06 MED ORDER — SODIUM CHLORIDE 0.9 % IV SOLN
INTRAVENOUS | Status: AC
  Administered 2015-08-07: via INTRAVENOUS

## 2015-08-06 NOTE — ED Provider Notes (Signed)
CSN: [id number]     Arrival date & time 08/06/15  2143 History   First MD Initiated Contact with Patient 08/06/15 2154     Chief Complaint  Patient presents with  . Pneumonia      Patient is a 45 y.o. female presenting with pneumonia. The history is provided by the patient.  Pneumonia This is a new problem. Associated symptoms include chest pain and shortness of breath. Pertinent negatives include no abdominal pain and no headaches.  Patient returns with pain in her back and chest. Seen in the ER yesterday and today before. Diagnosed fatigue 2 days ago and pneumonia yesterday. She is on Levaquin. States she continues to have severe pain in her back. States she is also having some trouble breathing with it. She's had 2 doses of Levaquin. States she has had some mild nausea. States she has some accidents with urine when she coughs. No dysuria. States she's been unable to manage this at home.  Past Medical History  Diagnosis Date  . Diabetes mellitus without complication ([place])   . Asthma   . Thyroid disease   . Chronic daily headache   . Chronic back pain   . Sciatica   . Chronic neck pain    Past Surgical History  Procedure Laterality Date  . Tubal ligation    . Cholecystectomy    . Cesarean section     Family History  Problem Relation Age of Onset  . Diabetes Mother   . Hypertension Mother   . Thyroid disease Mother    Social History  Substance Use Topics  . Smoking status: Never Smoker   . Smokeless tobacco: None  . Alcohol Use: No   OB History    Gravida Para Term Preterm AB TAB SAB Ectopic Multiple Living   6 4 3 1 2  2   3      Review of Systems  Constitutional: Positive for fever and appetite change.  HENT: Negative for trouble swallowing.   Respiratory: Positive for cough and shortness of breath.   Cardiovascular: Positive for chest pain.  Gastrointestinal: Negative for abdominal pain.  Endocrine: Negative for polyuria.  Genitourinary: Negative for hematuria  and difficulty urinating.  Musculoskeletal: Positive for myalgias and back pain.  Neurological: Negative for headaches.  Psychiatric/Behavioral: Negative for agitation.      Allergies  Bactrim and Penicillins  Home Medications   Prior to Admission medications   Medication Sig Start Date End Date Taking? Authorizing Provider  acetaminophen (TYLENOL) 500 MG tablet Take 2 tablets (1,000 mg total) by mouth every 6 (six) hours as needed. Patient taking differently: Take 1,000 mg by mouth every 6 (six) hours as needed for moderate pain or headache.  05/25/15   Charlesetta Shanks, MD  albuterol (PROVENTIL HFA;VENTOLIN HFA) 108 (90 Base) MCG/ACT inhaler Inhale 2 puffs into the lungs every 4 (four) hours as needed for wheezing or shortness of breath (cough, shortness of breath or wheezing.). 02/21/15   Shawnee Knapp, MD  cyclobenzaprine (FLEXERIL) 10 MG tablet Take 1 tablet (10 mg total) by mouth 3 (three) times daily as needed for muscle spasms. When not at work. On work days, just take 1 tab before bed Patient not taking: Reported on 05/25/2015 03/01/15   Shawnee Knapp, MD  diclofenac (VOLTAREN) 75 MG EC tablet Take 1 tablet (75 mg total) by mouth 2 (two) times daily. Patient not taking: Reported on 05/25/2015 03/01/15   Shawnee Knapp, MD  ibuprofen (ADVIL,MOTRIN) 200 MG tablet Take  800-1,000 mg by mouth every 6 (six) hours as needed for headache.    Historical Provider, MD  levofloxacin (LEVAQUIN) 500 MG tablet Take 1 tablet (500 mg total) by mouth daily. 08/06/15 08/12/15  Carmin Muskrat, MD  levothyroxine (SYNTHROID, LEVOTHROID) 75 MCG tablet Take 1 tablet (75 mcg total) by mouth daily. 08/04/15   Deno Etienne, DO  omeprazole (PRILOSEC OTC) 20 MG tablet Take 2 tablets (40 mg total) by mouth daily. 30 minutes before diiner Patient not taking: Reported on 05/25/2015 03/01/15   Shawnee Knapp, MD  triamterene-hydrochlorothiazide (MAXZIDE-25) 37.5-25 MG tablet Take 1 tablet by mouth daily. 07/20/15   Historical Provider, MD   BP  144/108 mmHg  Pulse 116  Temp(Src) 101.9 F (38.8 C) (Oral)  Resp 18  SpO2 100%  LMP 07/28/2015 Physical Exam  Constitutional: She appears well-developed and well-nourished.  HENT:  Head: Atraumatic.  Neck: Neck supple.  Cardiovascular:  Tachycardia  Pulmonary/Chest: Effort normal.  Mildly harsh breath sounds in the upper right lung fields.  Abdominal: Soft. There is no tenderness.  Musculoskeletal: She exhibits no edema.  Neurological: She is alert.  Skin: Skin is warm.    ED Course  Procedures (including critical care time) Labs Review Labs Reviewed  CBC WITH DIFFERENTIAL/PLATELET - Abnormal; Notable for the following:    RBC 3.53 (*)    Hemoglobin 9.5 (*)    HCT 28.8 (*)    All other components within normal limits  I-STAT CHEM 8, ED - Abnormal; Notable for the following:    Glucose, Bld 147 (*)    Calcium, Ion 1.08 (*)    Hemoglobin 9.9 (*)    HCT 29.0 (*)    All other components within normal limits  I-STAT CG4 LACTIC ACID, ED    Imaging Review Dg Chest 2 View  08/05/2015  CLINICAL DATA:  Fever EXAM: CHEST  2 VIEW COMPARISON:  August 04, 2015 FINDINGS: There is persistent airspace consolidation in the posterior segment of the right upper lobe. Lungs elsewhere are clear. Heart size and pulmonary vascularity are normal. No adenopathy. No bone lesions. IMPRESSION: Persistent airspace consolidation consistent with pneumonia in the posterior segment of the right upper lobe. Lungs elsewhere clear. Followup PA and lateral chest radiographs recommended in 3-4 weeks following trial of antibiotic therapy to ensure resolution and exclude underlying malignancy. Electronically Signed   By: Lowella Grip III M.D.   On: 08/05/2015 07:46   I have personally reviewed and evaluated these images and lab results as part of my medical decision-making.   EKG Interpretation None      MDM   Final diagnoses:  CAP (community acquired pneumonia)    Patient with committee acquired  pneumonia diagnosed on x-ray yesterday and day before. Has been on antibiotics for 2 doses but still continues to feel bad. Uncontrolled pain. Continue tachycardia and fever. Will admit to internal medicine.    Davonna Belling, MD 08/06/15 2329

## 2015-08-06 NOTE — H&P (Signed)
History and Physical    [name] [id number] DOB: [birth year] DOA: 08/06/2015  PCP: [name], MD   Patient coming from: Home   Chief Complaint: Dyspnea, fever, cough   HPI: [name] is a 45 y.o. female with medical history significant for asthma, hypothyroidism, and chronic back pain who presents emergency department with fevers, cough, dyspnea, and pain with deep inspiration. Patient reports fatigue and malaise going back approximately 4 weeks and then developed fevers starting 08/03/2015. She was evaluated in the emergency department on 08/04/2015 for these complaints and chest x-ray was obtained which was suggestive of pneumonia. Patient was discharged home and return the following day with the same symptoms in addition to new pleuritic pain. Chest x-ray was repeated yesterday, again consistent with pneumonia, and the patient was discharged home with Levaquin. Despite taking Levaquin for the past 2 days, patient reports that she is becoming increasingly dyspneic and there is been no change in her fevers or pleuritic pain. She denies abdominal pain, nausea, vomiting, or diarrhea. She denies lightheadedness, vision or hearing change, loss of coordination, or confusion. She endorses a mild headache that is consistent with her chronic daily headaches. She denies dysuria but endorses a couple episodes of urinary incontinence with coughing.  ED Course: Upon arrival to the ED, patient is found to be febrile to 38.8 C, saturating 100% on room air, tachycardic in the 110s, and with vital signs otherwise stable. Chemistry panel is largely unremarkable and CBC is notable for a stable normocytic anemia with hemoglobin of 9.5. Lactic acid is reassuring at 1.32. A 1 L normal saline bolus was administered in the emergency department. Patient remained mildly tachycardic but with stable blood pressure. She will be observed on the medical surgical unit for ongoing evaluation and management of the aforementioned  complaints suspected secondary to community-acquired pneumonia which is worsened despite outpatient treatment with appropriate therapy.  Review of Systems:  All other systems reviewed and apart from HPI, are negative.  Past Medical History  Diagnosis Date  . Diabetes mellitus without complication ([place])   . Asthma   . Thyroid disease   . Chronic daily headache   . Chronic back pain   . Sciatica   . Chronic neck pain     Past Surgical History  Procedure Laterality Date  . Tubal ligation    . Cholecystectomy    . Cesarean section       reports that she has never smoked. She does not have any smokeless tobacco history on file. She reports that she does not drink alcohol or use illicit drugs.  Allergies  Allergen Reactions  . Bactrim [Sulfamethoxazole-Trimethoprim] Hives  . Penicillins Other (See Comments)    Has patient had a PCN reaction causing immediate rash, facial/tongue/throat swelling, SOB or lightheadedness with hypotension: No Has patient had a PCN reaction causing severe rash involving mucus membranes or skin necrosis: No Has patient had a PCN reaction that required hospitalization No Has patient had a PCN reaction occurring within the last 10 years:yes If all of the above answers are "NO", then may proceed with Cephalosporin use."when I pee it hurts"    Family History  Problem Relation Age of Onset  . Diabetes Mother   . Hypertension Mother   . Thyroid disease Mother      Prior to Admission medications   Medication Sig Start Date End Date Taking? Authorizing Provider  acetaminophen (TYLENOL) 500 MG tablet Take 2 tablets (1,000 mg total) by mouth every 6 (six) hours as needed.  Patient taking differently: Take 1,000 mg by mouth every 6 (six) hours as needed for moderate pain or headache.  05/25/15  Yes Charlesetta Shanks, MD  albuterol (PROVENTIL HFA;VENTOLIN HFA) 108 (90 Base) MCG/ACT inhaler Inhale 2 puffs into the lungs every 4 (four) hours as needed for wheezing or  shortness of breath (cough, shortness of breath or wheezing.). 02/21/15  Yes Shawnee Knapp, MD  ibuprofen (ADVIL,MOTRIN) 200 MG tablet Take 800-1,000 mg by mouth every 6 (six) hours as needed for headache.   Yes Historical Provider, MD  levofloxacin (LEVAQUIN) 500 MG tablet Take 1 tablet (500 mg total) by mouth daily. 08/06/15 08/12/15 Yes Carmin Muskrat, MD  levothyroxine (SYNTHROID, LEVOTHROID) 75 MCG tablet Take 1 tablet (75 mcg total) by mouth daily. 08/04/15  Yes Deno Etienne, DO  cyclobenzaprine (FLEXERIL) 10 MG tablet Take 1 tablet (10 mg total) by mouth 3 (three) times daily as needed for muscle spasms. When not at work. On work days, just take 1 tab before bed Patient not taking: Reported on 05/25/2015 03/01/15   Shawnee Knapp, MD  diclofenac (VOLTAREN) 75 MG EC tablet Take 1 tablet (75 mg total) by mouth 2 (two) times daily. Patient not taking: Reported on 05/25/2015 03/01/15   Shawnee Knapp, MD  omeprazole (PRILOSEC OTC) 20 MG tablet Take 2 tablets (40 mg total) by mouth daily. 30 minutes before diiner Patient not taking: Reported on 05/25/2015 03/01/15   Shawnee Knapp, MD    Physical Exam: Filed Vitals:   08/06/15 2151  BP: 144/108  Pulse: 116  Temp: 101.9 F (38.8 C)  TempSrc: Oral  Resp: 18  SpO2: 100%      Constitutional: NAD, calm, in apparent discomfort  Eyes: PERTLA, lids and conjunctivae normal ENMT: Mucous membranes are moist. Posterior pharynx clear of any exudate or lesions.   Neck: normal, supple, no masses, no thyromegaly Respiratory: Rhonchi at right upper and mid-lung fields. Scattered expiratory wheezes. Normal respiratory effort. No accessory muscle use.  Cardiovascular: Rate ~110 and regular with soft systolic murmur at apex. No carotid bruits. No significant JVD. Abdomen: No distension, no tenderness, no masses palpated. Bowel sounds normal.  Musculoskeletal: no clubbing / cyanosis. No joint deformity upper and lower extremities. Normal muscle tone.  Skin: no significant rashes,  lesions, ulcers. Warm, dry, well-perfused. Neurologic: CN 2-12 grossly intact. Sensation intact, DTR normal. Strength 5/5 in all 4 limbs.  Psychiatric: Normal judgment and insight. Alert and oriented x 3. Normal mood and affect.     Labs on Admission: I have personally reviewed following labs and imaging studies  CBC:  Recent Labs Lab 08/04/15 1117 08/06/15 2300 08/06/15 2308  WBC 8.6 7.4  --   NEUTROABS 5.2 5.1  --   HGB 10.1* 9.5* 9.9*  HCT 31.5* 28.8* 29.0*  MCV 84.2 81.6  --   PLT 366 358  --    Basic Metabolic Panel:  Recent Labs Lab 08/04/15 1117 08/06/15 2308  NA 136 138  K 4.0 4.0  CL 105 102  CO2 25  --   GLUCOSE 127* 147*  BUN 14 8  CREATININE 0.62 0.70  CALCIUM 7.9*  --    GFR: Estimated Creatinine Clearance: 96 mL/min (by C-G formula based on Cr of 0.7). Liver Function Tests:  Recent Labs Lab 08/04/15 1117  AST 14*  ALT 14  ALKPHOS 78  BILITOT 0.4  PROT 7.2  ALBUMIN 3.2*   No results for input(s): LIPASE, AMYLASE in the last 168 hours. No results for  input(s): AMMONIA in the last 168 hours. Coagulation Profile: No results for input(s): INR, PROTIME in the last 168 hours. Cardiac Enzymes: No results for input(s): CKTOTAL, CKMB, CKMBINDEX, TROPONINI in the last 168 hours. BNP (last 3 results) No results for input(s): PROBNP in the last 8760 hours. HbA1C: No results for input(s): HGBA1C in the last 72 hours. CBG: No results for input(s): GLUCAP in the last 168 hours. Lipid Profile: No results for input(s): CHOL, HDL, LDLCALC, TRIG, CHOLHDL, LDLDIRECT in the last 72 hours. Thyroid Function Tests:  Recent Labs  08/04/15 1117  TSH 5.028*  T4TOTAL 8.8   Anemia Panel: No results for input(s): VITAMINB12, FOLATE, FERRITIN, TIBC, IRON, RETICCTPCT in the last 72 hours. Urine analysis:    Component Value Date/Time   COLORURINE YELLOW 12/29/2013 0738   APPEARANCEUR TURBID* 12/29/2013 0738   LABSPEC 1.011 12/29/2013 0738   PHURINE 6.0  12/29/2013 0738   GLUCOSEU NEGATIVE 12/29/2013 0738   HGBUR LARGE* 12/29/2013 0738   BILIRUBINUR negative 03/01/2015 1515   BILIRUBINUR NEGATIVE 12/29/2013 0738   KETONESUR negative 03/01/2015 1515   KETONESUR NEGATIVE 12/29/2013 0738   PROTEINUR negative 03/01/2015 1515   PROTEINUR 100* 12/29/2013 0738   UROBILINOGEN 0.2 03/01/2015 1515   UROBILINOGEN 0.2 12/29/2013 0738   NITRITE Negative 03/01/2015 1515   NITRITE NEGATIVE 12/29/2013 0738   LEUKOCYTESUR Negative 03/01/2015 1515   Sepsis Labs: @LABRCNTIP (procalcitonin:4,lacticidven:4) )No results found for this or any previous visit (from the past 240 hour(s)).   Radiological Exams on Admission: Dg Chest 2 View  08/05/2015  CLINICAL DATA:  Fever EXAM: CHEST  2 VIEW COMPARISON:  August 04, 2015 FINDINGS: There is persistent airspace consolidation in the posterior segment of the right upper lobe. Lungs elsewhere are clear. Heart size and pulmonary vascularity are normal. No adenopathy. No bone lesions. IMPRESSION: Persistent airspace consolidation consistent with pneumonia in the posterior segment of the right upper lobe. Lungs elsewhere clear. Followup PA and lateral chest radiographs recommended in 3-4 weeks following trial of antibiotic therapy to ensure resolution and exclude underlying malignancy. Electronically Signed   By: Lowella Grip III M.D.   On: 08/05/2015 07:46    EKG: Not performed, will obtain as appropriate   Assessment/Plan  1. CAP, organism not yet identified  - Presents with fever, tachycardia, cough, dyspnea, and CXR findings consistent with RUL PNA  - Still febrile and tachycardic after 48 hrs treatment with Levaquin - Observe on med-surg for empiric treatment with Rocephin and azithromycin  - Albuterol neb q4h prn SOB or wheezing  - 1 liter NS given as bolus in ED, will continue IV hydration overnight  - Check blood and sputum cultures; check urine for antigens to strep pneumo and legionella   2.  Iron-deficiency anemia  - Hgb 9.5 on admission with normal MCV  - Stable relative to priors; no s/s of active blood-loss  - Labs from earlier this yr consistent with iron-deficiency, will supplement    3. Hypertension  - Diastolic pressures in [id number] while in ED; pleuritic pain may be contributing  - Treat prn for now with hydralazine IVP's  - Consider scheduling an agent if elevations persist    4. Hypothyroidism  - Check thyroid studies given wks of fatigue - Continue current-dose Synthroid for now    DVT prophylaxis: sq Lovenox  Code Status: Full  Family Communication: Discussed with patient  Disposition Plan: Observe on med-surg  Consults called: None Admission status: Observation     Vianne Bulls, MD [facility] Pager  [phone]  If 7PM-7AM, please contact night-coverage [email or web address] Password [email or web address]  08/06/2015, 11:52 PM

## 2015-08-06 NOTE — ED Notes (Signed)
Pt state that she was seen yesterday and diagnosed with Pneumonia; pt state that she has taken the pain medication but that it is not working; pt c/o back, ribs and lung pain; pt c/o increased pain with coughing

## 2015-08-07 DIAGNOSIS — E038 Other specified hypothyroidism: Secondary | ICD-10-CM | POA: Diagnosis not present

## 2015-08-07 DIAGNOSIS — J189 Pneumonia, unspecified organism: Secondary | ICD-10-CM | POA: Diagnosis not present

## 2015-08-07 DIAGNOSIS — I10 Essential (primary) hypertension: Secondary | ICD-10-CM | POA: Diagnosis not present

## 2015-08-07 LAB — TSH: TSH: 3.815 u[IU]/mL (ref 0.350–4.500)

## 2015-08-07 LAB — T4, FREE: FREE T4: 0.92 ng/dL (ref 0.61–1.12)

## 2015-08-07 LAB — STREP PNEUMONIAE URINARY ANTIGEN: Strep Pneumo Urinary Antigen: NEGATIVE

## 2015-08-07 LAB — PROCALCITONIN: Procalcitonin: <0.1 ng/mL

## 2015-08-07 MED ORDER — GUAIFENESIN-DM 100-10 MG/5ML PO SYRP
5.0000 mL | ORAL_SOLUTION | ORAL | Status: DC | PRN
  Administered 2015-08-07 – 2015-08-10 (×9): 5 mL via ORAL
  Filled 2015-08-07 (×9): qty 10

## 2015-08-07 MED ORDER — SODIUM CHLORIDE 0.9 % IV BOLUS (SEPSIS)
500.0000 mL | Freq: Once | INTRAVENOUS | Status: AC
  Administered 2015-08-07: 500 mL via INTRAVENOUS

## 2015-08-07 MED ORDER — SODIUM CHLORIDE 0.9 % IV SOLN
INTRAVENOUS | Status: DC
  Administered 2015-08-07 – 2015-08-09 (×3): via INTRAVENOUS

## 2015-08-07 MED ORDER — IBUPROFEN 200 MG PO TABS
400.0000 mg | ORAL_TABLET | Freq: Three times a day (TID) | ORAL | Status: DC | PRN
Start: 2015-08-07 — End: 2015-08-11
  Administered 2015-08-08 – 2015-08-09 (×4): 400 mg via ORAL
  Filled 2015-08-07 (×4): qty 2

## 2015-08-07 MED ORDER — FERROUS SULFATE 325 (65 FE) MG PO TABS
325.0000 mg | ORAL_TABLET | Freq: Two times a day (BID) | ORAL | Status: DC
  Administered 2015-08-07 – 2015-08-09 (×4): 325 mg via ORAL
  Filled 2015-08-07 (×4): qty 1

## 2015-08-07 NOTE — Progress Notes (Signed)
PROGRESS NOTE    Pami Favret  [id number] DOB: 03-18-1970 DOA: 08/06/2015 PCP: [name], MD   Brief Narrative: Community acquired pneumonia, failed outpatient therapy. 45 yo female with hypothyroidism. Started on IV antibiotic therapy.   Assessment & Plan:   Principal Problem:   CAP (community acquired pneumonia) Active Problems:   Hypothyroidism   Iron deficiency anemia   Hypertension   1. Cardiovascular. Sepsis. Will continue iv fluids with saline at 75 cc.hr, will follow a restrictive IV fluids strategy, blood pressure [id number] systolic and heart rate 97, patient with persistent intermittent fever. Will continue antibiotic therapy with ceftriaxone and azithromycin,   2. Pulmonary. Community acquired pneumonia. Will continue antibiotic therapy as above, oxymetry monitor and supplemental 02 per [place],. Chest film personally reviewed noted right upper lobe.   3. Nephrology. Renal function with cr at 0.7 with K at 4,0, will follow on renal panel in am, avoid hypotension and nephrotoxic medications, will continue with isotonic saline at 75 cc/hr.  4. Endocrinology. TSH 3,815, will continue levothyroxine po.   Patient at moderate risk for worsening pneumonia and sepsis.  DVT prophylaxis:  lovenox Code Status: full Family Communication:  Disposition Plan: .   Consultants:     Procedures:    Antimicrobials:  Ceftriaxone Azithromycin   Subjective: Patient with improved dyspnea but still present, moderate in intensity, worse with exertion no improvement factors, associated with chest pain. No nausea or vomiting, no fever or chills, positive malaise.   Objective: Filed Vitals:   08/07/15 0021 08/07/15 0102 08/07/15 0135 08/07/15 0519  BP: 140/70 124/68  117/65  Pulse: 106 110  97  Temp: 102.1 F (38.9 C) 103.1 F (39.5 C) 100.9 F (38.3 C) 100.2 F (37.9 C)  TempSrc: Oral Oral Oral Oral  Resp:  19  20  Height:  5\' 2"  (1.575 m)    Weight:  96.163 kg (212 lb)    SpO2:  97% 99%  97%    Intake/Output Summary (Last 24 hours) at 08/07/15 1247 Last data filed at 08/07/15 0600  Gross per 24 hour  Intake  657.5 ml  Output    400 ml  Net  257.5 ml   Filed Weights   08/07/15 0102  Weight: 96.163 kg (212 lb)    Examination:  General exam: Deconditioned E ENT: mild conjunctival pallor, oral mucosa dry Respiratory system: Bilateral rales, with decreased breath sounds at bases due to poor inspiratory effort, no wheezing, rales or rhonchi. Cardiovascular system: S1 & S2 heard, RRR. No JVD, murmurs, rubs, gallops or clicks. Non pitting pedal edema ++. Gastrointestinal system: Abdomen  protuberant but nondistended, soft and nontender. No organomegaly or masses felt. Normal bowel sounds heard. Central nervous system: Alert and oriented. No focal neurological deficits. Extremities: Symmetric 5 x 5 power. Skin: No rashes, lesions or ulcers .     Data Reviewed: I have personally reviewed following labs and imaging studies  CBC:  Recent Labs Lab 08/04/15 1117 08/06/15 2300 08/06/15 2308  WBC 8.6 7.4  --   NEUTROABS 5.2 5.1  --   HGB 10.1* 9.5* 9.9*  HCT 31.5* 28.8* 29.0*  MCV 84.2 81.6  --   PLT 366 358  --    Basic Metabolic Panel:  Recent Labs Lab 08/04/15 1117 08/06/15 2308  NA 136 138  K 4.0 4.0  CL 105 102  CO2 25  --   GLUCOSE 127* 147*  BUN 14 8  CREATININE 0.62 0.70  CALCIUM 7.9*  --    GFR: Estimated  Creatinine Clearance: 96 mL/min (by C-G formula based on Cr of 0.7). Liver Function Tests:  Recent Labs Lab 08/04/15 1117  AST 14*  ALT 14  ALKPHOS 78  BILITOT 0.4  PROT 7.2  ALBUMIN 3.2*   No results for input(s): LIPASE, AMYLASE in the last 168 hours. No results for input(s): AMMONIA in the last 168 hours. Coagulation Profile: No results for input(s): INR, PROTIME in the last 168 hours. Cardiac Enzymes: No results for input(s): CKTOTAL, CKMB, CKMBINDEX, TROPONINI in the last 168 hours. BNP (last 3 results) No  results for input(s): PROBNP in the last 8760 hours. HbA1C: No results for input(s): HGBA1C in the last 72 hours. CBG: No results for input(s): GLUCAP in the last 168 hours. Lipid Profile: No results for input(s): CHOL, HDL, LDLCALC, TRIG, CHOLHDL, LDLDIRECT in the last 72 hours. Thyroid Function Tests:  Recent Labs  08/07/15 0002  TSH 3.815  FREET4 0.92   Anemia Panel: No results for input(s): VITAMINB12, FOLATE, FERRITIN, TIBC, IRON, RETICCTPCT in the last 72 hours. Sepsis Labs:  Recent Labs Lab 08/06/15 2309 08/07/15 0002  PROCALCITON  --  <0.10  LATICACIDVEN 1.32  --     No results found for this or any previous visit (from the past 240 hour(s)).       Radiology Studies: No results found.      Scheduled Meds: . azithromycin  500 mg Oral QHS  . cefTRIAXone (ROCEPHIN)  IV  1 g Intravenous QHS  . enoxaparin (LOVENOX) injection  40 mg Subcutaneous Q24H  . ferrous sulfate  325 mg Oral BID WC  . levothyroxine  75 mcg Oral Daily   Continuous Infusions:           Tawni Millers, MD [facility] Pager [phone]  If 7PM-7AM, please contact night-coverage [email or web address] Password [facility] 08/07/2015, 12:47 PM

## 2015-08-07 NOTE — Progress Notes (Signed)
PHARMACY NOTE -  ANTIBIOTIC RENAL DOSE ADJUSTMENT    Request received for Pharmacy to assist with antibiotic renal dose adjustment.   Patient has been initiated on Rocephin and Azithromycin for CAP.  CrCl wnl/stable  Current dosage is appropriate and need for further dosage adjustment appears unlikely at present.  Will sign off at this time.  Please reconsult if a change in clinical status warrants re-evaluation of dosage.  Reuel Boom, PharmD, BCPS Pager: [phone] 08/07/2015, 10:45 AM

## 2015-08-08 ENCOUNTER — Observation Stay (HOSPITAL_COMMUNITY): Payer: BLUE CROSS/BLUE SHIELD

## 2015-08-08 DIAGNOSIS — M549 Dorsalgia, unspecified: Secondary | ICD-10-CM | POA: Diagnosis present

## 2015-08-08 DIAGNOSIS — E039 Hypothyroidism, unspecified: Secondary | ICD-10-CM | POA: Diagnosis present

## 2015-08-08 DIAGNOSIS — M5430 Sciatica, unspecified side: Secondary | ICD-10-CM | POA: Diagnosis present

## 2015-08-08 DIAGNOSIS — N920 Excessive and frequent menstruation with regular cycle: Secondary | ICD-10-CM | POA: Diagnosis present

## 2015-08-08 DIAGNOSIS — Z8249 Family history of ischemic heart disease and other diseases of the circulatory system: Secondary | ICD-10-CM | POA: Diagnosis not present

## 2015-08-08 DIAGNOSIS — G8929 Other chronic pain: Secondary | ICD-10-CM | POA: Diagnosis present

## 2015-08-08 DIAGNOSIS — Z6838 Body mass index (BMI) 38.0-38.9, adult: Secondary | ICD-10-CM | POA: Diagnosis not present

## 2015-08-08 DIAGNOSIS — E669 Obesity, unspecified: Secondary | ICD-10-CM | POA: Diagnosis present

## 2015-08-08 DIAGNOSIS — Z79899 Other long term (current) drug therapy: Secondary | ICD-10-CM | POA: Diagnosis not present

## 2015-08-08 DIAGNOSIS — R51 Headache: Secondary | ICD-10-CM | POA: Diagnosis present

## 2015-08-08 DIAGNOSIS — A419 Sepsis, unspecified organism: Secondary | ICD-10-CM | POA: Diagnosis present

## 2015-08-08 DIAGNOSIS — D509 Iron deficiency anemia, unspecified: Secondary | ICD-10-CM | POA: Diagnosis not present

## 2015-08-08 DIAGNOSIS — J45909 Unspecified asthma, uncomplicated: Secondary | ICD-10-CM | POA: Diagnosis present

## 2015-08-08 DIAGNOSIS — Z9049 Acquired absence of other specified parts of digestive tract: Secondary | ICD-10-CM | POA: Diagnosis not present

## 2015-08-08 DIAGNOSIS — E119 Type 2 diabetes mellitus without complications: Secondary | ICD-10-CM | POA: Diagnosis present

## 2015-08-08 DIAGNOSIS — I10 Essential (primary) hypertension: Secondary | ICD-10-CM | POA: Diagnosis not present

## 2015-08-08 DIAGNOSIS — R000 Tachycardia, unspecified: Secondary | ICD-10-CM | POA: Diagnosis present

## 2015-08-08 DIAGNOSIS — Z833 Family history of diabetes mellitus: Secondary | ICD-10-CM | POA: Diagnosis not present

## 2015-08-08 DIAGNOSIS — J189 Pneumonia, unspecified organism: Secondary | ICD-10-CM | POA: Diagnosis not present

## 2015-08-08 DIAGNOSIS — R600 Localized edema: Secondary | ICD-10-CM | POA: Diagnosis present

## 2015-08-08 DIAGNOSIS — E038 Other specified hypothyroidism: Secondary | ICD-10-CM | POA: Diagnosis not present

## 2015-08-08 DIAGNOSIS — D500 Iron deficiency anemia secondary to blood loss (chronic): Secondary | ICD-10-CM | POA: Diagnosis not present

## 2015-08-08 DIAGNOSIS — R509 Fever, unspecified: Secondary | ICD-10-CM | POA: Diagnosis present

## 2015-08-08 DIAGNOSIS — D259 Leiomyoma of uterus, unspecified: Secondary | ICD-10-CM | POA: Diagnosis present

## 2015-08-08 LAB — CBC WITH DIFFERENTIAL/PLATELET
BASOS PCT: 0 %
Basophils Absolute: 0 10*3/uL (ref 0.0–0.1)
EOS ABS: 0.2 10*3/uL (ref 0.0–0.7)
Eosinophils Relative: 2 %
HCT: 26.7 % — ABNORMAL LOW (ref 36.0–46.0)
HEMOGLOBIN: 8.5 g/dL — AB (ref 12.0–15.0)
LYMPHS ABS: 2.2 10*3/uL (ref 0.7–4.0)
Lymphocytes Relative: 31 %
MCH: 26.6 pg (ref 26.0–34.0)
MCHC: 31.8 g/dL (ref 30.0–36.0)
MCV: 83.7 fL (ref 78.0–100.0)
Monocytes Absolute: 0.6 10*3/uL (ref 0.1–1.0)
Monocytes Relative: 9 %
NEUTROS PCT: 58 %
Neutro Abs: 4 10*3/uL (ref 1.7–7.7)
Platelets: 340 10*3/uL (ref 150–400)
RBC: 3.19 MIL/uL — AB (ref 3.87–5.11)
RDW: 15.3 % (ref 11.5–15.5)
WBC: 7 10*3/uL (ref 4.0–10.5)

## 2015-08-08 LAB — HIV ANTIBODY (ROUTINE TESTING W REFLEX): HIV Screen 4th Generation wRfx: NONREACTIVE

## 2015-08-08 LAB — BASIC METABOLIC PANEL
ANION GAP: 5 (ref 5–15)
BUN: 7 mg/dL (ref 6–20)
CHLORIDE: 108 mmol/L (ref 101–111)
CO2: 23 mmol/L (ref 22–32)
Calcium: 7.9 mg/dL — ABNORMAL LOW (ref 8.9–10.3)
Creatinine, Ser: 0.49 mg/dL (ref 0.44–1.00)
GFR calc non Af Amer: >60 mL/min (ref >60)
Glucose, Bld: 126 mg/dL — ABNORMAL HIGH (ref 65–99)
POTASSIUM: 3.8 mmol/L (ref 3.5–5.1)
SODIUM: 136 mmol/L (ref 135–145)

## 2015-08-08 LAB — LEGIONELLA PNEUMOPHILA SEROGP 1 UR AG: L. PNEUMOPHILA SEROGP 1 UR AG: NEGATIVE

## 2015-08-08 LAB — PROCALCITONIN: Procalcitonin: <0.1 ng/mL

## 2015-08-08 NOTE — Progress Notes (Signed)
PROGRESS NOTE    Tanylah Bieschke  [id number] DOB: [birth year] DOA: 08/06/2015 PCP: [name], MD   Brief Narrative: Community acquired pneumonia, failed outpatient therapy. 45 yo female with hypothyroidism. Started on IV antibiotic therapy  Assessment & Plan:   Principal Problem:   CAP (community acquired pneumonia) Active Problems:   Hypothyroidism   Iron deficiency anemia   Hypertension  1. Cardiovascular. Sepsis. Will continue iv fluids with saline at 60 cc.hr, will follow a restrictive IV fluids strategy. Patient with persistent intermittent fever. Will continue antibiotic therapy with ceftriaxone and azithromycin, will order x blood cultures and follow chest film.  2. Pulmonary. Community acquired pneumonia. Will continue antibiotic therapy with ceftriaxone and azithromycin, oxymetry monitor and supplemental 02 per [place], follow chest film personally reviewed noted new infiltrate on left upper lobe.   3. Nephrology. Renal function with cr at 0.49 with K at 3,8, will follow on renal panel in am, avoid hypotension and nephrotoxic medications, will continue with isotonic saline at 60 cc/hr.  4. Endocrinology. TSH 3,815, will continue levothyroxine po.   Patient at moderate risk for worsening pneumonia and sepsis.    DVT prophylaxis: lovenox Code Status: full Family Communication:  Disposition Plan:  Consultants:     Procedures:   Antimicrobials:  Subjective: Patient with persistent malaise and chest pain. Pain is moderate in intensity, worse with deep inspiration, no radiation, 8-10 intensity, mild improve with pain medications. No radiation, associated with poor oral intake and fevers. Positive cough.   Objective: Filed Vitals:   08/07/15 1447 08/07/15 2127 08/08/15 0044 08/08/15 0521  BP: 115/66 122/69  121/55  Pulse: 97 100  89  Temp: 98.6 F (37 C) 100.2 F (37.9 C) 100.2 F (37.9 C) 99 F (37.2 C)  TempSrc: Oral Oral Oral Oral  Resp: 20 18  19   Height:        Weight:      SpO2: 97% 99%  99%    Intake/Output Summary (Last 24 hours) at 08/08/15 1005 Last data filed at 08/08/15 [id number]  Gross per 24 hour  Intake 1620.83 ml  Output    700 ml  Net 920.83 ml   Filed Weights   08/07/15 0102  Weight: 96.163 kg (212 lb)    Examination:  General exam: deconditioned and ill looking appearing E ENT: no conjunctival pallor, oral mucosa moist. Respiratory system: bilateral rales, more predominant on the right, with no wheezing but scattered rhonochi.  Cardiovascular system: S1 & S2 heard, RRR. No JVD, murmurs, rubs, gallops or clicks. No pedal edema. Gastrointestinal system: Abdomen is nondistended, soft and nontender. No organomegaly or masses felt. Normal bowel sounds heard. Central nervous system: Alert and oriented. No focal neurological deficits. Extremities: Symmetric 5 x 5 power. Skin: No rashes, lesions or ulcers      Data Reviewed: I have personally reviewed following labs and imaging studies  CBC:  Recent Labs Lab 08/04/15 1117 08/06/15 2300 08/06/15 2308 08/08/15 0529  WBC 8.6 7.4  --  7.0  NEUTROABS 5.2 5.1  --  4.0  HGB 10.1* 9.5* 9.9* 8.5*  HCT 31.5* 28.8* 29.0* 26.7*  MCV 84.2 81.6  --  83.7  PLT 366 358  --  [id number]   Basic Metabolic Panel:  Recent Labs Lab 08/04/15 1117 08/06/15 2308 08/08/15 0529  NA 136 138 136  K 4.0 4.0 3.8  CL 105 102 108  CO2 25  --  23  GLUCOSE 127* 147* 126*  BUN 14 8 7   CREATININE 0.62 0.70 0.49  CALCIUM 7.9*  --  7.9*   GFR: Estimated Creatinine Clearance: 96 mL/min (by C-G formula based on Cr of 0.49). Liver Function Tests:  Recent Labs Lab 08/04/15 1117  AST 14*  ALT 14  ALKPHOS 78  BILITOT 0.4  PROT 7.2  ALBUMIN 3.2*   No results for input(s): LIPASE, AMYLASE in the last 168 hours. No results for input(s): AMMONIA in the last 168 hours. Coagulation Profile: No results for input(s): INR, PROTIME in the last 168 hours. Cardiac Enzymes: No results for input(s):  CKTOTAL, CKMB, CKMBINDEX, TROPONINI in the last 168 hours. BNP (last 3 results) No results for input(s): PROBNP in the last 8760 hours. HbA1C: No results for input(s): HGBA1C in the last 72 hours. CBG: No results for input(s): GLUCAP in the last 168 hours. Lipid Profile: No results for input(s): CHOL, HDL, LDLCALC, TRIG, CHOLHDL, LDLDIRECT in the last 72 hours. Thyroid Function Tests:  Recent Labs  08/07/15 0002  TSH 3.815  FREET4 0.92   Anemia Panel: No results for input(s): VITAMINB12, FOLATE, FERRITIN, TIBC, IRON, RETICCTPCT in the last 72 hours. Sepsis Labs:  Recent Labs Lab 08/06/15 2309 08/07/15 0002 08/08/15 0529  PROCALCITON  --  <0.10 <0.10  LATICACIDVEN 1.32  --   --     No results found for this or any previous visit (from the past 240 hour(s)).       Radiology Studies: No results found.      Scheduled Meds: . azithromycin  500 mg Oral QHS  . cefTRIAXone (ROCEPHIN)  IV  1 g Intravenous QHS  . enoxaparin (LOVENOX) injection  40 mg Subcutaneous Q24H  . ferrous sulfate  325 mg Oral BID WC  . levothyroxine  75 mcg Oral Daily   Continuous Infusions: . sodium chloride 100 mL/hr at 08/07/15 2334            Jahara Dail Gerome Apley, MD [facility] Pager [phone]  If 7PM-7AM, please contact night-coverage [email or web address] Password [email or web address] 08/08/2015, 10:05 AM

## 2015-08-09 ENCOUNTER — Inpatient Hospital Stay (HOSPITAL_COMMUNITY): Payer: BLUE CROSS/BLUE SHIELD

## 2015-08-09 DIAGNOSIS — E038 Other specified hypothyroidism: Secondary | ICD-10-CM

## 2015-08-09 DIAGNOSIS — D509 Iron deficiency anemia, unspecified: Secondary | ICD-10-CM

## 2015-08-09 DIAGNOSIS — I10 Essential (primary) hypertension: Secondary | ICD-10-CM

## 2015-08-09 LAB — IRON AND TIBC
IRON: 53 ug/dL (ref 28–170)
SATURATION RATIOS: 14 % (ref 10.4–31.8)
TIBC: 371 ug/dL (ref 250–450)
UIBC: 318 ug/dL

## 2015-08-09 LAB — CBC WITH DIFFERENTIAL/PLATELET
Basophils Absolute: 0 10*3/uL (ref 0.0–0.1)
Basophils Relative: 0 %
Eosinophils Absolute: 0.2 10*3/uL (ref 0.0–0.7)
Eosinophils Relative: 3 %
HEMATOCRIT: 25.5 % — AB (ref 36.0–46.0)
HEMOGLOBIN: 8.2 g/dL — AB (ref 12.0–15.0)
LYMPHS ABS: 1.9 10*3/uL (ref 0.7–4.0)
Lymphocytes Relative: 33 %
MCH: 26.9 pg (ref 26.0–34.0)
MCHC: 32.2 g/dL (ref 30.0–36.0)
MCV: 83.6 fL (ref 78.0–100.0)
MONOS PCT: 8 %
Monocytes Absolute: 0.4 10*3/uL (ref 0.1–1.0)
NEUTROS ABS: 3.2 10*3/uL (ref 1.7–7.7)
NEUTROS PCT: 56 %
Platelets: 346 10*3/uL (ref 150–400)
RBC: 3.05 MIL/uL — ABNORMAL LOW (ref 3.87–5.11)
RDW: 15.5 % (ref 11.5–15.5)
WBC: 5.8 10*3/uL (ref 4.0–10.5)

## 2015-08-09 LAB — BASIC METABOLIC PANEL
Anion gap: 5 (ref 5–15)
BUN: 8 mg/dL (ref 6–20)
CHLORIDE: 109 mmol/L (ref 101–111)
CO2: 25 mmol/L (ref 22–32)
CREATININE: 0.48 mg/dL (ref 0.44–1.00)
Calcium: 8 mg/dL — ABNORMAL LOW (ref 8.9–10.3)
GFR calc Af Amer: >60 mL/min (ref >60)
GFR calc non Af Amer: >60 mL/min (ref >60)
GLUCOSE: 112 mg/dL — AB (ref 65–99)
Potassium: 3.3 mmol/L — ABNORMAL LOW (ref 3.5–5.1)
Sodium: 139 mmol/L (ref 135–145)

## 2015-08-09 LAB — ABO/RH: ABO/RH(D): O POS

## 2015-08-09 LAB — FOLATE: Folate: 24.9 ng/mL (ref >5.9)

## 2015-08-09 LAB — OCCULT BLOOD X 1 CARD TO LAB, STOOL: Fecal Occult Bld: NEGATIVE

## 2015-08-09 LAB — PREPARE RBC (CROSSMATCH)

## 2015-08-09 LAB — VITAMIN B12: Vitamin B-12: 556 pg/mL (ref 180–914)

## 2015-08-09 MED ORDER — GUAIFENESIN ER 600 MG PO TB12
600.0000 mg | ORAL_TABLET | Freq: Two times a day (BID) | ORAL | Status: DC
  Administered 2015-08-09 – 2015-08-11 (×5): 600 mg via ORAL
  Filled 2015-08-09 (×5): qty 1

## 2015-08-09 MED ORDER — CYCLOBENZAPRINE HCL 5 MG PO TABS: 5.0000 mg | ORAL_TABLET | Freq: Three times a day (TID) | ORAL | Status: DC | PRN

## 2015-08-09 MED ORDER — SODIUM CHLORIDE 0.9 % IV SOLN
Freq: Once | INTRAVENOUS | Status: AC
  Administered 2015-08-09: 13:00:00 via INTRAVENOUS

## 2015-08-09 MED ORDER — IPRATROPIUM-ALBUTEROL 0.5-2.5 (3) MG/3ML IN SOLN
3.0000 mL | Freq: Once | RESPIRATORY_TRACT | Status: AC
  Administered 2015-08-09: 3 mL via RESPIRATORY_TRACT
  Filled 2015-08-09: qty 3

## 2015-08-09 MED ORDER — FERROUS SULFATE 325 (65 FE) MG PO TABS
325.0000 mg | ORAL_TABLET | ORAL | Status: DC
  Administered 2015-08-11: 325 mg via ORAL
  Filled 2015-08-09: qty 1

## 2015-08-09 MED ORDER — POTASSIUM CHLORIDE CRYS ER 20 MEQ PO TBCR
40.0000 meq | EXTENDED_RELEASE_TABLET | Freq: Once | ORAL | Status: AC
  Administered 2015-08-09: 40 meq via ORAL
  Filled 2015-08-09: qty 2

## 2015-08-09 NOTE — Progress Notes (Signed)
PROGRESS NOTE  Sandeep Forestier [id number] DOB: 07-28-1970 DOA: 08/06/2015 PCP: [name], MD  HPI/Recap of past 24 hours:  Report feeling tired, still has intermittent dry cough, c/o chronic  back pain  Assessment/Plan: Principal Problem:   CAP (community acquired pneumonia) Active Problems:   Hypothyroidism   Iron deficiency anemia   Hypertension   1. CAP, organism not yet identified  - Presents with fever, tachycardia, cough, dyspnea, and CXR findings consistent with RUL PNA  - Still febrile and tachycardic after 48 hrs treatment with Levaquin - empiric treatment with Rocephin and azithromycin  - blood culture no growth, sputum culture not collected;  urine negative for antigens to strep pneumo and legionella  -consider chest ct if persistent symptom  2. Iron-deficiency anemia , symptomatic anemia - Hgb 9.5 on admission with normal MCV , hgb 8.2 after hydration - report irregular and very heavy periods, with menstrual cramping, will get pelvic US, also get one unit of prbc for symptomatic anemia  3. Hypertension  - Diastolic pressures in [id number] while in ED; pleuritic pain may be contributing  - Treat prn for now with hydralazine IVP's  - Consider HCTZ if bp elevations persist , she also reported intermittent bilateral lower extremity edema, no edema today on exam. Will get echocardiogram  4. Hypothyroidism  -reported has run out of her medicine for several months - tsh 3.8, report feeling fatigue - restart home dose Synthroid    DVT prophylaxis: sq Lovenox  Code Status: Full  Family Communication: Discussed with patient  Disposition Plan: home in 1-2 days Consults called: None  Procedures:  prbc transfusion x1 on 7/12  Antibiotics:  Rocephin and zithromax from admission   Objective: BP 147/92 mmHg  Pulse 88  Temp(Src) 99 F (37.2 C) (Oral)  Resp 18  Ht 5\' 2"  (1.575 m)  Wt 96.163 kg (212 lb)  BMI 38.77 kg/m2  SpO2 100%  LMP  07/28/2015  Intake/Output Summary (Last 24 hours) at 08/09/15 0943 Last data filed at 08/09/15 [id number]  Gross per 24 hour  Intake    240 ml  Output   1050 ml  Net   -810 ml   Filed Weights   08/07/15 0102  Weight: 96.163 kg (212 lb)    Exam:   General:  Look tired, flat affect,   Cardiovascular: RRR  Respiratory: CTABL  Abdomen: Soft/ND/NT, positive BS  Musculoskeletal: No Edema  Neuro: aaox3  Psych: flat affect, psychomotor retardation? Though she denies depression  Data Reviewed: Basic Metabolic Panel:  Recent Labs Lab 08/04/15 1117 08/06/15 2308 08/08/15 0529 08/09/15 0527  NA 136 138 136 139  K 4.0 4.0 3.8 3.3*  CL 105 102 108 109  CO2 25  --  23 25  GLUCOSE 127* 147* 126* 112*  BUN 14 8 7 8   CREATININE 0.62 0.70 0.49 0.48  CALCIUM 7.9*  --  7.9* 8.0*   Liver Function Tests:  Recent Labs Lab 08/04/15 1117  AST 14*  ALT 14  ALKPHOS 78  BILITOT 0.4  PROT 7.2  ALBUMIN 3.2*   No results for input(s): LIPASE, AMYLASE in the last 168 hours. No results for input(s): AMMONIA in the last 168 hours. CBC:  Recent Labs Lab 08/04/15 1117 08/06/15 2300 08/06/15 2308 08/08/15 0529 08/09/15 0527  WBC 8.6 7.4  --  7.0 5.8  NEUTROABS 5.2 5.1  --  4.0 3.2  HGB 10.1* 9.5* 9.9* 8.5* 8.2*  HCT 31.5* 28.8* 29.0* 26.7* 25.5*  MCV 84.2 81.6  --  83.7 83.6  PLT 366 358  --  340 346   Cardiac Enzymes:   No results for input(s): CKTOTAL, CKMB, CKMBINDEX, TROPONINI in the last 168 hours. BNP (last 3 results) No results for input(s): BNP in the last 8760 hours.  ProBNP (last 3 results) No results for input(s): PROBNP in the last 8760 hours.  CBG: No results for input(s): GLUCAP in the last 168 hours.  Recent Results (from the past 240 hour(s))  Culture, blood (routine x 2) Call MD if unable to obtain prior to antibiotics being given     Status: None (Preliminary result)   Collection Time: 08/07/15 12:10 AM  Result Value Ref Range Status   Specimen  Description BLOOD RIGHT ANTECUBITAL  Final   Special Requests BOTTLES DRAWN AEROBIC AND ANAEROBIC 5ML  Final   Culture   Final    NO GROWTH 1 DAY Performed at [facility]    Report Status PENDING  Incomplete  Culture, blood (routine x 2) Call MD if unable to obtain prior to antibiotics being given     Status: None (Preliminary result)   Collection Time: 08/07/15  5:04 AM  Result Value Ref Range Status   Specimen Description BLOOD RIGHT HAND  Final   Special Requests IN PEDIATRIC BOTTLE 4CC  Final   Culture   Final    NO GROWTH 1 DAY Performed at [facility]    Report Status PENDING  Incomplete     Studies: Dg Chest 2 View  08/08/2015  CLINICAL DATA:  Subsequent encounter.  Follow-up pneumonia. EXAM: CHEST  2 VIEW COMPARISON:  08/05/2015 FINDINGS: Right upper lobe consolidation has shown no significant change from the prior study. There is subtle hazy airspace opacity now noted in the left upper lobe. There is also right medial lung base opacity accentuated by low lung volumes. This latter area of opacity may be due to atelectasis. No pleural effusion.  No pneumothorax. Cardiac silhouette is normal in size. No mediastinal or hilar masses. Skeletal structures are unremarkable. IMPRESSION: 1. No significant change in the right upper lobe pneumonia. 2. New area of airspace opacity in the left upper lobe is likely an area of new pneumonia. 3. Increased opacity in the medial right lung base may reflect additional pneumonia, atelectasis or a combination. Electronically Signed   By: Lajean Manes M.D.   On: 08/08/2015 11:04    Scheduled Meds: . sodium chloride   Intravenous Once  . azithromycin  500 mg Oral QHS  . cefTRIAXone (ROCEPHIN)  IV  1 g Intravenous QHS  . enoxaparin (LOVENOX) injection  40 mg Subcutaneous Q24H  . ferrous sulfate  325 mg Oral BID WC  . levothyroxine  75 mcg Oral Daily  . potassium chloride  40 mEq Oral Once    Continuous Infusions: . sodium chloride  60 mL/hr at 08/08/15 1726     Time spent: 21mins  Aurelius Gildersleeve MD, PhD  [facility] Pager [phone]. If 7PM-7AM, please contact night-coverage at [email or web address], password [facility] 08/09/2015, 9:43 AM  LOS: 1 day

## 2015-08-10 ENCOUNTER — Inpatient Hospital Stay (HOSPITAL_COMMUNITY): Payer: BLUE CROSS/BLUE SHIELD

## 2015-08-10 LAB — TYPE AND SCREEN
ABO/RH(D): O POS
ANTIBODY SCREEN: NEGATIVE
Unit division: 0

## 2015-08-10 LAB — BASIC METABOLIC PANEL
ANION GAP: 6 (ref 5–15)
BUN: 11 mg/dL (ref 6–20)
CHLORIDE: 110 mmol/L (ref 101–111)
CO2: 23 mmol/L (ref 22–32)
Calcium: 8.4 mg/dL — ABNORMAL LOW (ref 8.9–10.3)
Creatinine, Ser: 0.61 mg/dL (ref 0.44–1.00)
GFR calc non Af Amer: >60 mL/min (ref >60)
Glucose, Bld: 112 mg/dL — ABNORMAL HIGH (ref 65–99)
Potassium: 3.9 mmol/L (ref 3.5–5.1)
SODIUM: 139 mmol/L (ref 135–145)

## 2015-08-10 LAB — HEMOGLOBIN A1C
Hgb A1c MFr Bld: 6.1 % — ABNORMAL HIGH (ref 4.8–5.6)
MEAN PLASMA GLUCOSE: 128 mg/dL

## 2015-08-10 LAB — CBC
HEMATOCRIT: 28.2 % — AB (ref 36.0–46.0)
HEMOGLOBIN: 9.1 g/dL — AB (ref 12.0–15.0)
MCH: 27.1 pg (ref 26.0–34.0)
MCHC: 32.3 g/dL (ref 30.0–36.0)
MCV: 83.9 fL (ref 78.0–100.0)
Platelets: 364 10*3/uL (ref 150–400)
RBC: 3.36 MIL/uL — ABNORMAL LOW (ref 3.87–5.11)
RDW: 15.3 % (ref 11.5–15.5)
WBC: 6.2 10*3/uL (ref 4.0–10.5)

## 2015-08-10 LAB — MAGNESIUM: MAGNESIUM: 2 mg/dL (ref 1.7–2.4)

## 2015-08-10 LAB — ECHOCARDIOGRAM COMPLETE
Height: 62 in
Weight: 3392 oz

## 2015-08-10 LAB — PROCALCITONIN: Procalcitonin: <0.1 ng/mL

## 2015-08-10 MED ORDER — IPRATROPIUM-ALBUTEROL 0.5-2.5 (3) MG/3ML IN SOLN
3.0000 mL | RESPIRATORY_TRACT | Status: DC
  Administered 2015-08-10 (×2): 3 mL via RESPIRATORY_TRACT
  Filled 2015-08-10 (×2): qty 3

## 2015-08-10 MED ORDER — IPRATROPIUM-ALBUTEROL 0.5-2.5 (3) MG/3ML IN SOLN
3.0000 mL | Freq: Three times a day (TID) | RESPIRATORY_TRACT | Status: DC
  Administered 2015-08-11: 3 mL via RESPIRATORY_TRACT
  Filled 2015-08-10 (×2): qty 3

## 2015-08-10 MED ORDER — IPRATROPIUM-ALBUTEROL 0.5-2.5 (3) MG/3ML IN SOLN
3.0000 mL | RESPIRATORY_TRACT | Status: DC
  Administered 2015-08-10: 3 mL via RESPIRATORY_TRACT
  Filled 2015-08-10: qty 3

## 2015-08-10 MED ORDER — PERFLUTREN LIPID MICROSPHERE
1.0000 mL | INTRAVENOUS | Status: AC | PRN
  Administered 2015-08-10: 2 mL via INTRAVENOUS
  Filled 2015-08-10: qty 10

## 2015-08-10 MED ORDER — PERFLUTREN LIPID MICROSPHERE
INTRAVENOUS | Status: AC
  Filled 2015-08-10: qty 10

## 2015-08-10 NOTE — Progress Notes (Signed)
PROGRESS NOTE  [name] [id number] DOB: [birth year] DOA: 08/06/2015 PCP: [name], MD  HPI/Recap of past 24 hours:  Fever subsided, still has intermittent dry cough, reported nebs and mucinex helped. Report start to have lower extremity edema  Assessment/Plan: Principal Problem:   CAP (community acquired pneumonia) Active Problems:   Hypothyroidism   Iron deficiency anemia   Hypertension   1. CAP, failed outpatient treatment - Presents with fever, tachycardia, cough, dyspnea, and CXR findings consistent with RUL PNA  - Still febrile and tachycardic after 48 hrs treatment with Levaquin - empiric treatment with Rocephin and azithromycin  - blood culture no growth, sputum culture not collected;  urine negative for antigens to strep pneumo and legionella , HIV negative -chest ct  On 7/13 due to persistent symptom  2. Iron-deficiency anemia , symptomatic anemia - Hgb 9.5 on admission with normal MCV , hgb 8.2 after hydration -s/p prbc transfusion x1 on 7/12, hgb with appropriate increase -start oral iron supplement   3.irregular and very heavy periods, with menstrual cramping, need to gyn follow up  transvaginal us,IMPRESSION: 1. Small posterior uterine fibroid is stable compared with previous exam. 2. Normal appearance of the endometrial stripe. If bleeding remains unresponsive to hormonal or medical therapy, sonohysterogram should be considered for focal lesion work-up. (Ref: Radiological Reasoning: Algorithmic Workup of Abnormal Vaginal Bleeding with Endovaginal Sonography and Sonohysterography. AJR 2008; [id number]) 3. Nonvisualized ovaries. No adnexal mass identified.  4. Hypertension , not on meds previously - Diastolic pressures in [id number] while in ED; pleuritic pain may be contributing  - Treat prn for now with hydralazine IVP's  - Consider HCTZ if bp elevations persist , she also reported intermittent bilateral lower extremity edema, no edema today on exam.  Will get echocardiogram  5. Hypothyroidism  -reported has run out of her medicine for several months - tsh 3.8, report feeling fatigue - restart home dose Synthroid   6 lower extremity edema, d/c ivf, compression stocking, elevate legs, consider hctz at discharge, she report history of intermittent lower extremity edema   DVT prophylaxis: sq Lovenox  Code Status: Full  Family Communication: Discussed with patient  Disposition Plan: home in 1-2 days Consults called: None  Procedures:  prbc transfusion x1 on 7/12  Antibiotics:  Rocephin and zithromax from admission   Objective: BP 123/80 mmHg  Pulse 70  Temp(Src) 97.9 F (36.6 C) (Oral)  Resp 17  Ht 5\' 2"  (1.575 m)  Wt 96.163 kg (212 lb)  BMI 38.77 kg/m2  SpO2 98%  LMP 07/28/2015  Intake/Output Summary (Last 24 hours) at 08/10/15 1251 Last data filed at 08/10/15 1045  Gross per 24 hour  Intake   2268 ml  Output    425 ml  Net   1843 ml   Filed Weights   08/07/15 0102  Weight: 96.163 kg (212 lb)    Exam:   General:  Look tired, flat affect,   Cardiovascular: RRR  Respiratory: CTABL  Abdomen: Soft/ND/NT, positive BS  Musculoskeletal: No Edema  Neuro: aaox3  Psych: flat affect, psychomotor retardation? Though she denies depression  Data Reviewed: Basic Metabolic Panel:  Recent Labs Lab 08/04/15 1117 08/06/15 2308 08/08/15 0529 08/09/15 0527 08/10/15 0521  NA 136 138 136 139 139  K 4.0 4.0 3.8 3.3* 3.9  CL 105 102 108 109 110  CO2 25  --  23 25 23   GLUCOSE 127* 147* 126* 112* 112*  BUN 14 8 7 8 11   CREATININE 0.62 0.70 0.49 0.48 0.61  CALCIUM 7.9*  --  7.9* 8.0* 8.4*  MG  --   --   --   --  2.0   Liver Function Tests:  Recent Labs Lab 08/04/15 1117  AST 14*  ALT 14  ALKPHOS 78  BILITOT 0.4  PROT 7.2  ALBUMIN 3.2*   No results for input(s): LIPASE, AMYLASE in the last 168 hours. No results for input(s): AMMONIA in the last 168 hours. CBC:  Recent Labs Lab 08/04/15 1117  08/06/15 2300 08/06/15 2308 08/08/15 0529 August 13, 2015 0527 08/10/15 0521  WBC 8.6 7.4  --  7.0 5.8 6.2  NEUTROABS 5.2 5.1  --  4.0 3.2  --   HGB 10.1* 9.5* 9.9* 8.5* 8.2* 9.1*  HCT 31.5* 28.8* 29.0* 26.7* 25.5* 28.2*  MCV 84.2 81.6  --  83.7 83.6 83.9  PLT 366 358  --  340 346 364   Cardiac Enzymes:   No results for input(s): CKTOTAL, CKMB, CKMBINDEX, TROPONINI in the last 168 hours. BNP (last 3 results) No results for input(s): BNP in the last 8760 hours.  ProBNP (last 3 results) No results for input(s): PROBNP in the last 8760 hours.  CBG: No results for input(s): GLUCAP in the last 168 hours.  Recent Results (from the past 240 hour(s))  Culture, blood (routine x 2) Call MD if unable to obtain prior to antibiotics being given     Status: None (Preliminary result)   Collection Time: 08/07/15 12:10 AM  Result Value Ref Range Status   Specimen Description BLOOD RIGHT ANTECUBITAL  Final   Special Requests BOTTLES DRAWN AEROBIC AND ANAEROBIC 5ML  Final   Culture   Final    NO GROWTH 2 DAYS Performed at [facility]    Report Status PENDING  Incomplete  Culture, blood (routine x 2) Call MD if unable to obtain prior to antibiotics being given     Status: None (Preliminary result)   Collection Time: 08/07/15  5:04 AM  Result Value Ref Range Status   Specimen Description BLOOD RIGHT HAND  Final   Special Requests IN PEDIATRIC BOTTLE 4CC  Final   Culture   Final    NO GROWTH 2 DAYS Performed at [facility]    Report Status PENDING  Incomplete     Studies: US Transvaginal Non-ob  08/13/15  CLINICAL DATA:  Menorrhagia.  Gravida 4 para 3.  LMP 07/28/2015. EXAM: TRANSABDOMINAL AND TRANSVAGINAL ULTRASOUND OF PELVIS TECHNIQUE: Both transabdominal and transvaginal ultrasound examinations of the pelvis were performed. Transabdominal technique was performed for global imaging of the pelvis including uterus, ovaries, adnexal regions, and pelvic cul-de-sac. It was  necessary to proceed with endovaginal exam following the transabdominal exam to visualize the uterus, endometrium, ovaries. COMPARISON:  12/29/2013 FINDINGS: Uterus Measurements: 7.8 x 5.2 x 4.5 cm. Posterior fibroid is 1.8 x 1.8 x 1.4 cm. Endometrium Thickness: 6.4 mm.  No focal abnormality visualized. Right ovary Measurements: The ovary is not visualized, either absent or obscured . No adnexal mass. Left ovary Measurements: The ovary is not visualized, either absent or obscured . No adnexal mass. Other findings No abnormal free fluid. IMPRESSION: 1. Small posterior uterine fibroid is stable compared with previous exam. 2. Normal appearance of the endometrial stripe. If bleeding remains unresponsive to hormonal or medical therapy, sonohysterogram should be considered for focal lesion work-up. (Ref: Radiological Reasoning: Algorithmic Workup of Abnormal Vaginal Bleeding with Endovaginal Sonography and Sonohysterography. AJR 2008; [id number]) 3. Nonvisualized ovaries.  No adnexal mass identified. Electronically Signed  By: Nolon Nations M.D.   On: 08/09/2015 16:06   US Pelvis Complete  08/09/2015  CLINICAL DATA:  Menorrhagia.  Gravida 4 para 3.  LMP 07/28/2015. EXAM: TRANSABDOMINAL AND TRANSVAGINAL ULTRASOUND OF PELVIS TECHNIQUE: Both transabdominal and transvaginal ultrasound examinations of the pelvis were performed. Transabdominal technique was performed for global imaging of the pelvis including uterus, ovaries, adnexal regions, and pelvic cul-de-sac. It was necessary to proceed with endovaginal exam following the transabdominal exam to visualize the uterus, endometrium, ovaries. COMPARISON:  12/29/2013 FINDINGS: Uterus Measurements: 7.8 x 5.2 x 4.5 cm. Posterior fibroid is 1.8 x 1.8 x 1.4 cm. Endometrium Thickness: 6.4 mm.  No focal abnormality visualized. Right ovary Measurements: The ovary is not visualized, either absent or obscured . No adnexal mass. Left ovary Measurements: The ovary is not  visualized, either absent or obscured . No adnexal mass. Other findings No abnormal free fluid. IMPRESSION: 1. Small posterior uterine fibroid is stable compared with previous exam. 2. Normal appearance of the endometrial stripe. If bleeding remains unresponsive to hormonal or medical therapy, sonohysterogram should be considered for focal lesion work-up. (Ref: Radiological Reasoning: Algorithmic Workup of Abnormal Vaginal Bleeding with Endovaginal Sonography and Sonohysterography. AJR 2008; [id number]) 3. Nonvisualized ovaries.  No adnexal mass identified. Electronically Signed   By: Nolon Nations M.D.   On: 08/09/2015 16:06    Scheduled Meds: . azithromycin  500 mg Oral QHS  . cefTRIAXone (ROCEPHIN)  IV  1 g Intravenous QHS  . enoxaparin (LOVENOX) injection  40 mg Subcutaneous Q24H  . [START ON 08/11/2015] ferrous sulfate  325 mg Oral QODAY  . guaiFENesin  600 mg Oral BID  . ipratropium-albuterol  3 mL Nebulization Q4H WA  . levothyroxine  75 mcg Oral Daily    Continuous Infusions:     Time spent: 3mins  Laysha Childers MD, PhD  [facility] Pager [phone]. If 7PM-7AM, please contact night-coverage at [email or web address], password [facility] 08/10/2015, 12:51 PM  LOS: 2 days

## 2015-08-10 NOTE — Progress Notes (Signed)
  Echocardiogram 2D Echocardiogram has been performed.  [name] M 08/10/2015, 2:35 PM

## 2015-08-10 NOTE — Clinical Documentation Improvement (Signed)
Hospitalist  Please update your documentation within the medical record to reflect your response to this query. Thank you  Can the diagnosis of Sepsis be further specified and if present on admission carried through the medical record?   Etiology: due to local infection (specify infection), due to device/implant/prosthesis or other medical or surgical care (specify), postoperative  Organism: pneumococcal, staphylococcal, other bacteria (specify), drug resistance  Status: sepsis ruled in, sepsis is resolving, sepsis ruled out  Present on Admission: present on admission, not present on admission  Other  Clinically Undetermined  Supporting Information: 08/08/15 progr note.Marland Kitchen"Community acquired pneumonia, failed outpatient therapy.Marland KitchenMarland KitchenCardiovascular. Sepsis. Will continue iv fluids with saline at 60 cc.hr, will follow a restrictive IV fluids strategy. Patient with persistent intermittent fever. Will continue antibiotic therapy with ceftriaxone and azithromycin, will order x blood cultures and follow chest film."  Please exercise your independent, professional judgment when responding. A specific answer is not anticipated or expected.  Thank You,  Ermelinda Das, RN, BSN, [place] Certified Clinical Documentation Specialist [place]: Health Information Management [phone]

## 2015-08-11 DIAGNOSIS — J189 Pneumonia, unspecified organism: Secondary | ICD-10-CM

## 2015-08-11 DIAGNOSIS — D500 Iron deficiency anemia secondary to blood loss (chronic): Secondary | ICD-10-CM

## 2015-08-11 MED ORDER — FERROUS SULFATE 325 (65 FE) MG PO TABS: 325.0000 mg | ORAL_TABLET | ORAL | Status: DC

## 2015-08-11 MED ORDER — LEVOTHYROXINE SODIUM 75 MCG PO TABS: 75.0000 ug | ORAL_TABLET | Freq: Every day | ORAL | Status: DC

## 2015-08-11 MED ORDER — GUAIFENESIN ER 600 MG PO TB12: 600.0000 mg | ORAL_TABLET | Freq: Two times a day (BID) | ORAL | Status: DC

## 2015-08-11 NOTE — Progress Notes (Signed)
Pt discharged from the unit via wheelchair. Discharge instructions were reviewed with the pt. No questions or concerns at this time.  Kaelynn Igo W Vaibhav Fogleman, RN 

## 2015-08-11 NOTE — Discharge Summary (Addendum)
Discharge Summary  [name] [id number] DOB: 1970/02/08  PCP: [name], MD  Admit date: 08/06/2015 Discharge date: 08/11/2015  Time spent:> 68mins  Recommendations for Outpatient Follow-up:  1. F/u with PMD within a week  for hospital discharge follow up, repeat cbc/bmp at follow up 2. F/u with pulmonology, repeat CT chest in one month  Discharge Diagnoses:  Active Hospital Problems   Diagnosis Date Noted  . CAP (community acquired pneumonia) 08/06/2015  . Hypertension 08/06/2015  . Iron deficiency anemia 02/24/2015  . Hypothyroidism 11/24/2014    Resolved Hospital Problems   Diagnosis Date Noted Date Resolved  No resolved problems to display.    Discharge Condition: stable  Diet recommendation: heart healthy/carb modified  Filed Weights   08/07/15 0102  Weight: 96.163 kg (212 lb)    History of present illness:  Patient coming from: Home   Chief Complaint: Dyspnea, fever, cough   HPI: [name] is a 45 y.o. female with medical history significant for asthma, hypothyroidism, and chronic back pain who presents emergency department with fevers, cough, dyspnea, and pain with deep inspiration. Patient reports fatigue and malaise going back approximately 4 weeks and then developed fevers starting 08/03/2015. She was evaluated in the emergency department on 08/04/2015 for these complaints and chest x-ray was obtained which was suggestive of pneumonia. Patient was discharged home and return the following day with the same symptoms in addition to new pleuritic pain. Chest x-ray was repeated yesterday, again consistent with pneumonia, and the patient was discharged home with Levaquin. Despite taking Levaquin for the past 2 days, patient reports that she is becoming increasingly dyspneic and there is been no change in her fevers or pleuritic pain. She denies abdominal pain, nausea, vomiting, or diarrhea. She denies lightheadedness, vision or hearing change, loss of coordination, or  confusion. She endorses a mild headache that is consistent with her chronic daily headaches. She denies dysuria but endorses a couple episodes of urinary incontinence with coughing.  ED Course: Upon arrival to the ED, patient is found to be febrile to 38.8 C, saturating 100% on room air, tachycardic in the 110s, and with vital signs otherwise stable. Chemistry panel is largely unremarkable and CBC is notable for a stable normocytic anemia with hemoglobin of 9.5. Lactic acid is reassuring at 1.32. A 1 L normal saline bolus was administered in the emergency department. Patient remained mildly tachycardic but with stable blood pressure. She will be observed on the medical surgical unit for ongoing evaluation and management of the aforementioned complaints suspected secondary to community-acquired pneumonia which is worsened despite outpatient treatment with appropriate therapy.  Hospital Course:  Principal Problem:   CAP (community acquired pneumonia) Active Problems:   Hypothyroidism   Iron deficiency anemia   Hypertension   CAP, failed outpatient treatment - Presents with fever, tachycardia, cough, dyspnea, and CXR findings consistent with RUL PNA , sepsis presented on admission. - Still febrile and tachycardic after 48 hrs treatment with Levaquin - empiric treatment with Rocephin and azithromycin  - blood culture no growth, sputum culture not collected; urine negative for antigens to strep pneumo and legionella , HIV negative -chest ct On 7/13 due to persistent symptom showed Dense mass like pneumonia involving the right upper lobe with nodular opacities throughout both lungs pulmonology consulted who recommended continue levaquin to finish total of 7 day abx course and outpatient follow up with pulmonology to have repeat CT chest Pulmonology also recommended sputum AFB and MAI testing however, patient does not have productive cough.  2. Iron-deficiency anemia , symptomatic anemia - Hgb  9.5 on admission with normal MCV , hgb 8.2 after hydration -s/p prbc transfusion x1 on 7/12, hgb with appropriate increase -started oral iron supplement, outpatient gyn follow up  3.irregular and very heavy periods, with menstrual cramping, need to gyn follow up transvaginal us,IMPRESSION: 1. Small posterior uterine fibroid is stable compared with previous exam. 2. Normal appearance of the endometrial stripe. If bleeding remains unresponsive to hormonal or medical therapy, sonohysterogram should be considered for focal lesion work-up. (Ref: Radiological Reasoning: Algorithmic Workup of Abnormal Vaginal Bleeding with Endovaginal Sonography and Sonohysterography. AJR 2008; [id number]) 3. Nonvisualized ovaries. No adnexal mass identified.  4. Hypertension , not on meds previously - Diastolic pressures in [id number] while in ED; pleuritic pain may be contributing  - Treat prn for now with hydralazine IVP's  - she also reported intermittent bilateral lower extremity edema, no edema today on exam. May benefit from hctz, to be determined by pmd. Ted hose provided at discharge Echocardiogram : Left ventricle: The cavity size was normal. Systolic function was normal. The estimated ejection fraction was in the range of 55%  to 60%. Wall motion was normal; there were no regional wall  motion abnormalities.  5. Hypothyroidism  -reported has run out of her medicine for several months - tsh 3.8, report feeling fatigue - restart home dose Synthroid   Obesity:  Body mass index is 38.77 kg/(m^2). life style changes    DVT prophylaxis in the hospital: sq Lovenox  Code Status: Full  Family Communication: Discussed with patient  Disposition Plan: home , work letter provided Consults called: pulmonology  Procedures:  prbc transfusion x1 on 7/12  Antibiotics:  Rocephin and zithromax from admission   Discharge Exam: BP 104/55 mmHg  Pulse 72  Temp(Src) 98.5 F (36.9 C) (Oral)  Resp  18  Ht 5\' 2"  (1.575 m)  Wt 96.163 kg (212 lb)  BMI 38.77 kg/m2  SpO2 98%  LMP 07/28/2015   General: NAD,  more pleasant today  Cardiovascular: RRR  Respiratory: CTABL  Abdomen: Soft/ND/NT, positive BS  Musculoskeletal: No Edema  Neuro: aaox3  Psych: more pleasant today   Discharge Instructions You were cared for by a hospitalist during your hospital stay. If you have any questions about your discharge medications or the care you received while you were in the hospital after you are discharged, you can call the unit and asked to speak with the hospitalist on call if the hospitalist that took care of you is not available. Once you are discharged, your primary care physician will handle any further medical issues. Please note that NO REFILLS for any discharge medications will be authorized once you are discharged, as it is imperative that you return to your primary care physician (or establish a relationship with a primary care physician if you do not have one) for your aftercare needs so that they can reassess your need for medications and monitor your lab values.      Discharge Instructions    Diet - low sodium heart healthy    Complete by:  As directed      Increase activity slowly    Complete by:  As directed             Medication List    STOP taking these medications        diclofenac 75 MG EC tablet  Commonly known as:  VOLTAREN     ibuprofen 200 MG tablet  Commonly known as:  ADVIL,MOTRIN     omeprazole 20 MG tablet  Commonly known as:  PRILOSEC OTC      TAKE these medications        acetaminophen 500 MG tablet  Commonly known as:  TYLENOL  Take 2 tablets (1,000 mg total) by mouth every 6 (six) hours as needed.     albuterol 108 (90 Base) MCG/ACT inhaler  Commonly known as:  PROVENTIL HFA;VENTOLIN HFA  Inhale 2 puffs into the lungs every 4 (four) hours as needed for wheezing or shortness of breath (cough, shortness of breath or wheezing.).      cyclobenzaprine 10 MG tablet  Commonly known as:  FLEXERIL  Take 1 tablet (10 mg total) by mouth 3 (three) times daily as needed for muscle spasms. When not at work. On work days, just take 1 tab before bed     ferrous sulfate 325 (65 FE) MG tablet  Take 1 tablet (325 mg total) by mouth every other day.     guaiFENesin 600 MG 12 hr tablet  Commonly known as:  MUCINEX  Take 1 tablet (600 mg total) by mouth 2 (two) times daily.     levofloxacin 500 MG tablet  Commonly known as:  LEVAQUIN  Take 1 tablet (500 mg total) by mouth daily.     levothyroxine 75 MCG tablet  Commonly known as:  SYNTHROID, LEVOTHROID  Take 1 tablet (75 mcg total) by mouth daily.       Allergies  Allergen Reactions  . Bactrim [Sulfamethoxazole-Trimethoprim] Hives  . Penicillins Other (See Comments)    Has patient had a PCN reaction causing immediate rash, facial/tongue/throat swelling, SOB or lightheadedness with hypotension: No Has patient had a PCN reaction causing severe rash involving mucus membranes or skin necrosis: No Has patient had a PCN reaction that required hospitalization No Has patient had a PCN reaction occurring within the last 10 years:yes If all of the above answers are "NO", then may proceed with Cephalosporin use."when I pee it hurts"   Follow-up Information    Follow up with [facility] In 1 week.   Why:  hospital discharge follow up for pna, anemia   Contact information:   [place] [phone]      Follow up with WENZEL, JULIE N, PA-C In 2 weeks.   Specialty:  Obstetrics and Gynecology   Why:  heavy menses, anemia   Contact information:   [place] [address] [phone]       Follow up with Noe Gens, NP On 08/30/2015.   Specialty:  Pulmonary Disease   Why:  Appt at 12:00 PM at [facility].  VERY IMPORTANT that you have a repeat CT of the Chest in approximately one  month for repeat evaluation of chest.   Contact information:   [place] [address] [phone]        The results of significant diagnostics from this hospitalization (including imaging, microbiology, ancillary and laboratory) are listed below for reference.    Significant Diagnostic Studies: Dg Chest 2 View  08/08/2015  CLINICAL DATA:  Subsequent encounter.  Follow-up pneumonia. EXAM: CHEST  2 VIEW COMPARISON:  08/05/2015 FINDINGS: Right upper lobe consolidation has shown no significant change from the prior study. There is subtle hazy airspace opacity now noted in the left upper lobe. There is also right medial lung base opacity accentuated by low lung volumes. This latter area of opacity may be due  to atelectasis. No pleural effusion.  No pneumothorax. Cardiac silhouette is normal in size. No mediastinal or hilar masses. Skeletal structures are unremarkable. IMPRESSION: 1. No significant change in the right upper lobe pneumonia. 2. New area of airspace opacity in the left upper lobe is likely an area of new pneumonia. 3. Increased opacity in the medial right lung base may reflect additional pneumonia, atelectasis or a combination. Electronically Signed   By: Lajean Manes M.D.   On: 08/08/2015 11:04   Dg Chest 2 View  08/05/2015  CLINICAL DATA:  Fever EXAM: CHEST  2 VIEW COMPARISON:  August 04, 2015 FINDINGS: There is persistent airspace consolidation in the posterior segment of the right upper lobe. Lungs elsewhere are clear. Heart size and pulmonary vascularity are normal. No adenopathy. No bone lesions. IMPRESSION: Persistent airspace consolidation consistent with pneumonia in the posterior segment of the right upper lobe. Lungs elsewhere clear. Followup PA and lateral chest radiographs recommended in 3-4 weeks following trial of antibiotic therapy to ensure resolution and exclude underlying malignancy. Electronically Signed   By: Lowella Grip III M.D.   On: 08/05/2015 07:46     Dg Chest 2 View  08/04/2015  CLINICAL DATA:  Cough.  Weakness.  Symptoms of 4 weeks duration. EXAM: CHEST  2 VIEW COMPARISON:  02/21/2015 FINDINGS: Heart size is normal. Mediastinal shadows are normal. The left lung is clear. There is segmental bronchopneumonia in the right upper lobe. No collapse. No effusion. Ordinary degenerative changes affect the spine. IMPRESSION: Segmental bronchopneumonia in the right upper lobe. Electronically Signed   By: Nelson Chimes M.D.   On: 08/04/2015 12:14   Ct Chest Wo Contrast  08/10/2015  CLINICAL DATA:  45 year old admitted 2 days ago with right upper lobe pneumonia, with persistent fever, tachycardia, cough and shortness of breath despite antibiotic therapy. EXAM: CT CHEST WITHOUT CONTRAST TECHNIQUE: Multidetector CT imaging of the chest was performed following the standard protocol without IV contrast. COMPARISON:  No prior CT.  Chest x-rays 08/08/2015 and earlier. FINDINGS: Cardiovascular: Heart size upper normal. No significant pericardial effusion. No visible coronary atherosclerosis. No visible thoracic or upper abdominal aortic atherosclerosis. Mediastinum/Nodes: Numerous normal-sized mediastinal lymph nodes. No pathologically enlarged mediastinal, hilar or axillary lymph nodes. No mediastinal masses. Normal-appearing esophagus. Visualized thyroid gland unremarkable. Lungs/Pleura: Dense masslike consolidation in the right upper lobe. Nodules throughout both lungs, many of which have an acinar airspace appearance, some of which appear as standard small nodules. Small right pleural effusion. No left pleural effusion. Central airways patent. Upper Abdomen: Hepatosplenomegaly. Visualized upper abdomen otherwise unremarkable for the unenhanced technique. Musculoskeletal: Mild degenerative changes involving the mid thoracic spine. No acute abnormalities. IMPRESSION: 1. Dense mass like pneumonia involving the right upper lobe with nodular opacities throughout both lungs.  The findings most likely represent an atypical infection such as mycobacterium avium intracellulare. Malignancy can have this appearance though this is felt less likely. 2. Hepatosplenomegaly. Electronically Signed   By: Evangeline Dakin M.D.   On: 08/10/2015 21:20   US Transvaginal Non-ob  08/09/2015  CLINICAL DATA:  Menorrhagia.  Gravida 4 para 3.  LMP 07/28/2015. EXAM: TRANSABDOMINAL AND TRANSVAGINAL ULTRASOUND OF PELVIS TECHNIQUE: Both transabdominal and transvaginal ultrasound examinations of the pelvis were performed. Transabdominal technique was performed for global imaging of the pelvis including uterus, ovaries, adnexal regions, and pelvic cul-de-sac. It was necessary to proceed with endovaginal exam following the transabdominal exam to visualize the uterus, endometrium, ovaries. COMPARISON:  12/29/2013 FINDINGS: Uterus Measurements: 7.8 x 5.2 x 4.5 cm.  Posterior fibroid is 1.8 x 1.8 x 1.4 cm. Endometrium Thickness: 6.4 mm.  No focal abnormality visualized. Right ovary Measurements: The ovary is not visualized, either absent or obscured . No adnexal mass. Left ovary Measurements: The ovary is not visualized, either absent or obscured . No adnexal mass. Other findings No abnormal free fluid. IMPRESSION: 1. Small posterior uterine fibroid is stable compared with previous exam. 2. Normal appearance of the endometrial stripe. If bleeding remains unresponsive to hormonal or medical therapy, sonohysterogram should be considered for focal lesion work-up. (Ref: Radiological Reasoning: Algorithmic Workup of Abnormal Vaginal Bleeding with Endovaginal Sonography and Sonohysterography. AJR 2008; [id number]) 3. Nonvisualized ovaries.  No adnexal mass identified. Electronically Signed   By: Nolon Nations M.D.   On: 08/09/2015 16:06   US Pelvis Complete  08/09/2015  CLINICAL DATA:  Menorrhagia.  Gravida 4 para 3.  LMP 07/28/2015. EXAM: TRANSABDOMINAL AND TRANSVAGINAL ULTRASOUND OF PELVIS TECHNIQUE: Both  transabdominal and transvaginal ultrasound examinations of the pelvis were performed. Transabdominal technique was performed for global imaging of the pelvis including uterus, ovaries, adnexal regions, and pelvic cul-de-sac. It was necessary to proceed with endovaginal exam following the transabdominal exam to visualize the uterus, endometrium, ovaries. COMPARISON:  12/29/2013 FINDINGS: Uterus Measurements: 7.8 x 5.2 x 4.5 cm. Posterior fibroid is 1.8 x 1.8 x 1.4 cm. Endometrium Thickness: 6.4 mm.  No focal abnormality visualized. Right ovary Measurements: The ovary is not visualized, either absent or obscured . No adnexal mass. Left ovary Measurements: The ovary is not visualized, either absent or obscured . No adnexal mass. Other findings No abnormal free fluid. IMPRESSION: 1. Small posterior uterine fibroid is stable compared with previous exam. 2. Normal appearance of the endometrial stripe. If bleeding remains unresponsive to hormonal or medical therapy, sonohysterogram should be considered for focal lesion work-up. (Ref: Radiological Reasoning: Algorithmic Workup of Abnormal Vaginal Bleeding with Endovaginal Sonography and Sonohysterography. AJR 2008; [id number]) 3. Nonvisualized ovaries.  No adnexal mass identified. Electronically Signed   By: Nolon Nations M.D.   On: 08/09/2015 16:06    Microbiology: Recent Results (from the past 240 hour(s))  Culture, blood (routine x 2) Call MD if unable to obtain prior to antibiotics being given     Status: None (Preliminary result)   Collection Time: 08/07/15 12:10 AM  Result Value Ref Range Status   Specimen Description BLOOD RIGHT ANTECUBITAL  Final   Special Requests BOTTLES DRAWN AEROBIC AND ANAEROBIC 5ML  Final   Culture   Final    NO GROWTH 3 DAYS Performed at [facility]    Report Status PENDING  Incomplete  Culture, blood (routine x 2) Call MD if unable to obtain prior to antibiotics being given     Status: None (Preliminary result)    Collection Time: 08/07/15  5:04 AM  Result Value Ref Range Status   Specimen Description BLOOD RIGHT HAND  Final   Special Requests IN PEDIATRIC BOTTLE 4CC  Final   Culture   Final    NO GROWTH 3 DAYS Performed at [facility]    Report Status PENDING  Incomplete     Labs: Basic Metabolic Panel:  Recent Labs Lab 08/06/15 2308 08/08/15 0529 08/09/15 0527 08/10/15 0521  NA 138 136 139 139  K 4.0 3.8 3.3* 3.9  CL 102 108 109 110  CO2  --  23 25 23   GLUCOSE 147* 126* 112* 112*  BUN 8 7 8 11   CREATININE 0.70 0.49 0.48 0.61  CALCIUM  --  7.9* 8.0* 8.4*  MG  --   --   --  2.0   Liver Function Tests: No results for input(s): AST, ALT, ALKPHOS, BILITOT, PROT, ALBUMIN in the last 168 hours. No results for input(s): LIPASE, AMYLASE in the last 168 hours. No results for input(s): AMMONIA in the last 168 hours. CBC:  Recent Labs Lab 08/06/15 2300 08/06/15 2308 08/08/15 0529 08/09/15 0527 08/10/15 0521  WBC 7.4  --  7.0 5.8 6.2  NEUTROABS 5.1  --  4.0 3.2  --   HGB 9.5* 9.9* 8.5* 8.2* 9.1*  HCT 28.8* 29.0* 26.7* 25.5* 28.2*  MCV 81.6  --  83.7 83.6 83.9  PLT 358  --  340 346 364   Cardiac Enzymes: No results for input(s): CKTOTAL, CKMB, CKMBINDEX, TROPONINI in the last 168 hours. BNP: BNP (last 3 results) No results for input(s): BNP in the last 8760 hours.  ProBNP (last 3 results) No results for input(s): PROBNP in the last 8760 hours.  CBG: No results for input(s): GLUCAP in the last 168 hours.     SignedFlorencia Reasons MD, PhD  [facility] 08/11/2015, 2:51 PM

## 2015-08-11 NOTE — Consult Note (Signed)
Name: Manar Funches MRN: [id number] DOB: 07/18/1970    ADMISSION DATE:  08/06/2015 CONSULTATION DATE:  08/11/15  REFERRING MD :  Dr. Erlinda Hong  CHIEF COMPLAINT:  Pneumonia   HISTORY OF PRESENT ILLNESS:  45 y/o F with a past medical history of diabetes mellitus, thyroid disease (noncompliant with replacement therapy), chronic back pain, chronic headaches, cholecystectomy and asthma who was admitted 7/9 to [facility] with concerns for pneumonia.  On admission, the patient is a 4 week/malaise and then she began developing fevers on 7/6. She was evaluated in the emergency room on 7/7 for malaise and fatigue.  She had not been taking her thyroid medication 6 months. She was restarted back on her Synthroid and discharged home (which she did not do as of 7/8 ER note).    She returned on 7/8 with the same symptoms with the addition of pleuritic chest pain.  Chest x-ray at that time was concerning for pneumonia and fever to 102.5. She was discharged home on Levaquin.  ER notes reflect she had not started the antibiotics after 7/8 ER visit.  She was treated with antibiotics in the ER discharged home.   She returned again (third visit ) on 7/9 with complaints of chest pain and increased shortness of breath. Initial evaluation on 7/9 concerning for fever, tachycardia and chest x-ray concerning for pneumonia.  She was admitted per [facility].  Antibiotics were started on 7/10 - rocephin / azithromycin (doubt outpatient abx failure as she had not taken the medications).  She continued to complain of pleuritic chest pain and cough.  CT of the chest was evaluated 7/13 for symptoms which showed a dense mass-like pneumonia in the RUL with nodular opacities throughout both lungs, hepatosplenomegaly.    PCCM consulted for evaluation of symptoms.    The patient reports she has had difficulty obtaining medications due to copay costs at MD office.  She has been out of her thyroid medication > 31months.  She  recently established with a new PCP - Butch Penny at [place] / [place] on [facility].  She reports she has not had a PAP in >13 years.  Denies rectal bleeding.  Reports heavy menses.    PAST MEDICAL HISTORY :   has a past medical history of Diabetes mellitus without complication ([place]); Asthma; Thyroid disease; Chronic daily headache; Chronic back pain; Sciatica; and Chronic neck pain.  has past surgical history that includes Tubal ligation; Cholecystectomy; and Cesarean section.    Prior to Admission medications   Medication Sig Start Date End Date Taking? Authorizing Provider  acetaminophen (TYLENOL) 500 MG tablet Take 2 tablets (1,000 mg total) by mouth every 6 (six) hours as needed. Patient taking differently: Take 1,000 mg by mouth every 6 (six) hours as needed for moderate pain or headache.  05/25/15  Yes Charlesetta Shanks, MD  albuterol (PROVENTIL HFA;VENTOLIN HFA) 108 (90 Base) MCG/ACT inhaler Inhale 2 puffs into the lungs every 4 (four) hours as needed for wheezing or shortness of breath (cough, shortness of breath or wheezing.). 02/21/15  Yes Shawnee Knapp, MD  ibuprofen (ADVIL,MOTRIN) 200 MG tablet Take 800-1,000 mg by mouth every 6 (six) hours as needed for headache.   Yes Historical Provider, MD  levofloxacin (LEVAQUIN) 500 MG tablet Take 1 tablet (500 mg total) by mouth daily. 08/06/15 08/12/15 Yes Carmin Muskrat, MD  levothyroxine (SYNTHROID, LEVOTHROID) 75 MCG tablet Take 1 tablet (75 mcg total) by mouth daily. 08/04/15  Yes Deno Etienne, DO  cyclobenzaprine (FLEXERIL)  10 MG tablet Take 1 tablet (10 mg total) by mouth 3 (three) times daily as needed for muscle spasms. When not at work. On work days, just take 1 tab before bed Patient not taking: Reported on 05/25/2015 03/01/15   Shawnee Knapp, MD  diclofenac (VOLTAREN) 75 MG EC tablet Take 1 tablet (75 mg total) by mouth 2 (two) times daily. Patient not taking: Reported on 05/25/2015 03/01/15   Shawnee Knapp, MD  omeprazole (PRILOSEC OTC) 20 MG  tablet Take 2 tablets (40 mg total) by mouth daily. 30 minutes before diiner Patient not taking: Reported on 05/25/2015 03/01/15   Shawnee Knapp, MD   Allergies  Allergen Reactions  . Bactrim [Sulfamethoxazole-Trimethoprim] Hives  . Penicillins Other (See Comments)    Has patient had a PCN reaction causing immediate rash, facial/tongue/throat swelling, SOB or lightheadedness with hypotension: No Has patient had a PCN reaction causing severe rash involving mucus membranes or skin necrosis: No Has patient had a PCN reaction that required hospitalization No Has patient had a PCN reaction occurring within the last 10 years:yes If all of the above answers are "NO", then may proceed with Cephalosporin use."when I pee it hurts"    FAMILY HISTORY:  family history includes Diabetes in her mother; Hypertension in her mother; Thyroid disease in her mother.   SOCIAL HISTORY:  reports that she has never smoked. She does not have any smokeless tobacco history on file. She reports that she does not drink alcohol or use illicit drugs.  REVIEW OF SYSTEMS:  POSITIVES IN BOLD  Constitutional: Negative for fever, chills, weight loss, malaise/fatigue and diaphoresis.  HENT: Negative for hearing loss, ear pain, nosebleeds, congestion, sore throat, neck pain, tinnitus and ear discharge.   Eyes: Negative for blurred vision, double vision, photophobia, pain, discharge and redness.  Respiratory: Negative for cough, pleuritic chest pain, hemoptysis, sputum production, shortness of breath, wheezing and stridor.  Cardiovascular: Negative for chest pain, palpitations, orthopnea, claudication, leg swelling and PND.  Gastrointestinal: Negative for heartburn, nausea, vomiting, abdominal pain, diarrhea, constipation, blood in stool and melena.  Genitourinary: Negative for dysuria, urgency, frequency, hematuria and flank pain.  Musculoskeletal: Negative for myalgias, back pain, joint pain and falls.  Skin: Negative for itching  and rash.  Neurological: Negative for dizziness, tingling, tremors, sensory change, speech change, focal weakness, seizures, loss of consciousness, weakness and headaches.  Endo/Heme/Allergies: Negative for environmental allergies and polydipsia. Does not bruise/bleed easily. GYN:  Heavy menses  SUBJECTIVE:   VITAL SIGNS: Temp:  [98.5 F (36.9 C)-98.7 F (37.1 C)] 98.5 F (36.9 C) (07/14 0517) Pulse Rate:  [72-85] 72 (07/14 0517) Resp:  [17-18] 18 (07/14 0517) BP: (104-124)/(55-76) 104/55 mmHg (07/14 0517) SpO2:  [98 %-100 %] 98 % (07/14 0739)  PHYSICAL EXAMINATION: General:  Chronically ill appearing female in NAD Neuro:  AAOx4, speech clear, MAE  HEENT:  MM pink/moist, no jvd Cardiovascular:  s1s2 rrr, no m/r/g  Lungs:  Even/non-labored, lungs bilaterally coarse Abdomen:  Obese/soft, bsx4 active  Musculoskeletal:  No acute deformities Skin:  Warm/dry, no edema    Recent Labs Lab 08/08/15 0529 08/09/15 0527 08/10/15 0521  NA 136 139 139  K 3.8 3.3* 3.9  CL 108 109 110  CO2 23 25 23   BUN 7 8 11   CREATININE 0.49 0.48 0.61  GLUCOSE 126* 112* 112*    Recent Labs Lab 08/08/15 0529 08/09/15 0527 08/10/15 0521  HGB 8.5* 8.2* 9.1*  HCT 26.7* 25.5* 28.2*  WBC 7.0 5.8 6.2  PLT 340 346 364   Ct Chest Wo Contrast  08/10/2015  CLINICAL DATA:  45 year old admitted 2 days ago with right upper lobe pneumonia, with persistent fever, tachycardia, cough and shortness of breath despite antibiotic therapy. EXAM: CT CHEST WITHOUT CONTRAST TECHNIQUE: Multidetector CT imaging of the chest was performed following the standard protocol without IV contrast. COMPARISON:  No prior CT.  Chest x-rays 08/08/2015 and earlier. FINDINGS: Cardiovascular: Heart size upper normal. No significant pericardial effusion. No visible coronary atherosclerosis. No visible thoracic or upper abdominal aortic atherosclerosis. Mediastinum/Nodes: Numerous normal-sized mediastinal lymph nodes. No pathologically  enlarged mediastinal, hilar or axillary lymph nodes. No mediastinal masses. Normal-appearing esophagus. Visualized thyroid gland unremarkable. Lungs/Pleura: Dense masslike consolidation in the right upper lobe. Nodules throughout both lungs, many of which have an acinar airspace appearance, some of which appear as standard small nodules. Small right pleural effusion. No left pleural effusion. Central airways patent. Upper Abdomen: Hepatosplenomegaly. Visualized upper abdomen otherwise unremarkable for the unenhanced technique. Musculoskeletal: Mild degenerative changes involving the mid thoracic spine. No acute abnormalities. IMPRESSION: 1. Dense mass like pneumonia involving the right upper lobe with nodular opacities throughout both lungs. The findings most likely represent an atypical infection such as mycobacterium avium intracellulare. Malignancy can have this appearance though this is felt less likely. 2. Hepatosplenomegaly. Electronically Signed   By: Evangeline Dakin M.D.   On: 08/10/2015 21:20   US Transvaginal Non-ob  08/09/2015  CLINICAL DATA:  Menorrhagia.  Gravida 4 para 3.  LMP 07/28/2015. EXAM: TRANSABDOMINAL AND TRANSVAGINAL ULTRASOUND OF PELVIS TECHNIQUE: Both transabdominal and transvaginal ultrasound examinations of the pelvis were performed. Transabdominal technique was performed for global imaging of the pelvis including uterus, ovaries, adnexal regions, and pelvic cul-de-sac. It was necessary to proceed with endovaginal exam following the transabdominal exam to visualize the uterus, endometrium, ovaries. COMPARISON:  12/29/2013 FINDINGS: Uterus Measurements: 7.8 x 5.2 x 4.5 cm. Posterior fibroid is 1.8 x 1.8 x 1.4 cm. Endometrium Thickness: 6.4 mm.  No focal abnormality visualized. Right ovary Measurements: The ovary is not visualized, either absent or obscured . No adnexal mass. Left ovary Measurements: The ovary is not visualized, either absent or obscured . No adnexal mass. Other findings  No abnormal free fluid. IMPRESSION: 1. Small posterior uterine fibroid is stable compared with previous exam. 2. Normal appearance of the endometrial stripe. If bleeding remains unresponsive to hormonal or medical therapy, sonohysterogram should be considered for focal lesion work-up. (Ref: Radiological Reasoning: Algorithmic Workup of Abnormal Vaginal Bleeding with Endovaginal Sonography and Sonohysterography. AJR 2008; [id number]) 3. Nonvisualized ovaries.  No adnexal mass identified. Electronically Signed   By: Nolon Nations M.D.   On: 08/09/2015 16:06   US Pelvis Complete  08/09/2015  CLINICAL DATA:  Menorrhagia.  Gravida 4 para 3.  LMP 07/28/2015. EXAM: TRANSABDOMINAL AND TRANSVAGINAL ULTRASOUND OF PELVIS TECHNIQUE: Both transabdominal and transvaginal ultrasound examinations of the pelvis were performed. Transabdominal technique was performed for global imaging of the pelvis including uterus, ovaries, adnexal regions, and pelvic cul-de-sac. It was necessary to proceed with endovaginal exam following the transabdominal exam to visualize the uterus, endometrium, ovaries. COMPARISON:  12/29/2013 FINDINGS: Uterus Measurements: 7.8 x 5.2 x 4.5 cm. Posterior fibroid is 1.8 x 1.8 x 1.4 cm. Endometrium Thickness: 6.4 mm.  No focal abnormality visualized. Right ovary Measurements: The ovary is not visualized, either absent or obscured . No adnexal mass. Left ovary Measurements: The ovary is not visualized, either absent or obscured . No adnexal mass. Other findings No abnormal free fluid.  IMPRESSION: 1. Small posterior uterine fibroid is stable compared with previous exam. 2. Normal appearance of the endometrial stripe. If bleeding remains unresponsive to hormonal or medical therapy, sonohysterogram should be considered for focal lesion work-up. (Ref: Radiological Reasoning: Algorithmic Workup of Abnormal Vaginal Bleeding with Endovaginal Sonography and Sonohysterography. AJR 2008; [id number]) 3. Nonvisualized  ovaries.  No adnexal mass identified. Electronically Signed   By: Nolon Nations M.D.   On: 08/09/2015 16:06    SIGNIFICANT EVENTS  7/07  ER Visit >> d/c'd with rx for thyroid replacement in setting of fatigue / malaise 7/08  ER Visit >> Dx with PNA, treated with abx, d/c on levaquin (did not take) 7/09  ER Visit with admit for PNA  STUDIES:  CT Chest 7/13 >> dense mass-like pneumonia in the RUL with nodular opacities throughout both lungs, hepatosplenomegaly.     ASSESSMENT / PLAN:  Community Acquired PNA - doubt outpatient failure as   Nodular Opacities Bilaterally, Dense R Mass-like PNA   Plan: Complete 7 days total antibiotics. She already has levaquin at home.  Can complete therapy with this as was not outpatient failure. Will need repeat CT imaging approximately on month after completion of abx Pulmonary hygiene - IS, mobilize Pulmonary follow up arranged for discharge.  Monitor fever curve / WBC Assess sputum for AFB, culture.  R/O MAI    Noe Gens, NP-C [facility] Pgr: [phone] or if no answer [phone] 08/11/2015, 10:19 AM   Attending note: I have seen and examined the patient with nurse practitioner/resident and agree with the note. History, labs and imaging reviewed.  45 Y/O with PMH of DM, thyroid disease, asthma. Admitted with fevers, malaise. Images of the CT of head reviewd > shows bilateral nodular opacities.  This may be secondary to CAP but the radiographic appearance if some what atypical. Agree with treating with abx Repeat CT scan as follow up Check Sputum for AFB, MAI  Marshell Garfinkel MD [facility] Pager [phone] If no answer or after 3pm call: [phone] 08/11/2015, 1:33 PM

## 2015-08-12 LAB — CULTURE, BLOOD (ROUTINE X 2)
Culture: NO GROWTH
Culture: NO GROWTH

## 2015-08-28 ENCOUNTER — Encounter (HOSPITAL_COMMUNITY): Payer: Self-pay | Admitting: Emergency Medicine

## 2015-08-28 ENCOUNTER — Emergency Department (HOSPITAL_COMMUNITY)
Admission: EM | Admit: 2015-08-28 | Discharge: 2015-08-28 | Disposition: A | Discharge status: Home / Self Care | Payer: BLUE CROSS/BLUE SHIELD | Attending: Emergency Medicine | Admitting: Emergency Medicine

## 2015-08-28 ENCOUNTER — Emergency Department (HOSPITAL_COMMUNITY): Payer: BLUE CROSS/BLUE SHIELD

## 2015-08-28 DIAGNOSIS — R059 Cough, unspecified: Secondary | ICD-10-CM

## 2015-08-28 DIAGNOSIS — I10 Essential (primary) hypertension: Secondary | ICD-10-CM | POA: Insufficient documentation

## 2015-08-28 DIAGNOSIS — Z79899 Other long term (current) drug therapy: Secondary | ICD-10-CM | POA: Diagnosis not present

## 2015-08-28 DIAGNOSIS — R05 Cough: Secondary | ICD-10-CM | POA: Insufficient documentation

## 2015-08-28 DIAGNOSIS — R0602 Shortness of breath: Secondary | ICD-10-CM | POA: Diagnosis not present

## 2015-08-28 DIAGNOSIS — E039 Hypothyroidism, unspecified: Secondary | ICD-10-CM | POA: Insufficient documentation

## 2015-08-28 DIAGNOSIS — J45909 Unspecified asthma, uncomplicated: Secondary | ICD-10-CM | POA: Insufficient documentation

## 2015-08-28 DIAGNOSIS — E119 Type 2 diabetes mellitus without complications: Secondary | ICD-10-CM | POA: Insufficient documentation

## 2015-08-28 LAB — BASIC METABOLIC PANEL
Anion gap: 6 (ref 5–15)
BUN: 18 mg/dL (ref 6–20)
CO2: 24 mmol/L (ref 22–32)
Calcium: 8.5 mg/dL — ABNORMAL LOW (ref 8.9–10.3)
Chloride: 108 mmol/L (ref 101–111)
Creatinine, Ser: 0.59 mg/dL (ref 0.44–1.00)
GFR calc non Af Amer: >60 mL/min (ref >60)
Glucose, Bld: 120 mg/dL — ABNORMAL HIGH (ref 65–99)
POTASSIUM: 4.2 mmol/L (ref 3.5–5.1)
SODIUM: 138 mmol/L (ref 135–145)

## 2015-08-28 LAB — CBC WITH DIFFERENTIAL/PLATELET
BASOS PCT: 0 %
Basophils Absolute: 0 10*3/uL (ref 0.0–0.1)
EOS ABS: 0.2 10*3/uL (ref 0.0–0.7)
Eosinophils Relative: 4 %
HCT: 34.2 % — ABNORMAL LOW (ref 36.0–46.0)
HEMOGLOBIN: 10.9 g/dL — AB (ref 12.0–15.0)
LYMPHS ABS: 2.3 10*3/uL (ref 0.7–4.0)
Lymphocytes Relative: 41 %
MCH: 27.5 pg (ref 26.0–34.0)
MCHC: 31.9 g/dL (ref 30.0–36.0)
MCV: 86.4 fL (ref 78.0–100.0)
Monocytes Absolute: 0.5 10*3/uL (ref 0.1–1.0)
Monocytes Relative: 8 %
NEUTROS PCT: 47 %
Neutro Abs: 2.6 10*3/uL (ref 1.7–7.7)
Platelets: 321 10*3/uL (ref 150–400)
RBC: 3.96 MIL/uL (ref 3.87–5.11)
RDW: 15.8 % — ABNORMAL HIGH (ref 11.5–15.5)
WBC: 5.5 10*3/uL (ref 4.0–10.5)

## 2015-08-28 LAB — TROPONIN I

## 2015-08-28 MED ORDER — BENZONATATE 100 MG PO CAPS
100.0000 mg | ORAL_CAPSULE | Freq: Three times a day (TID) | ORAL | 0 refills | Status: DC | PRN
Start: 2015-08-28 — End: 2016-11-03

## 2015-08-28 NOTE — ED Notes (Signed)
Pt refuses further lab draws. PA informed

## 2015-08-28 NOTE — ED Notes (Signed)
PA at bedside.

## 2015-08-28 NOTE — ED Notes (Signed)
Unable to collect labs PT in xray 

## 2015-08-28 NOTE — ED Provider Notes (Signed)
[facility] DEPT Provider Note   CSN: [id number] Arrival date & time: 08/28/15  [id number]  First Provider Contact:  First MD Initiated Contact with Patient 08/28/15 [phone]        History   Chief Complaint Chief Complaint  Patient presents with  . Cough    HPI Ura Kephart is a 45 y.o. female.  The history is provided by the patient and medical records.  Cough    45 year old female with history of asthma, chronic back pain, diabetes, thyroid disease, presenting to the ED for cough. Patient was hospitalized earlier this month for community-acquired pneumonia and fatigue. She was found to be anemic as well. She was here for several days on IV antibiotics and was discharged home on Levaquin. She states she completed her course of antibiotics but does not feel any better. She states she continues to have a dry cough and some mild shortness of breath. She continues to have some pain in her back in her chest with coughing. States she has somewhat of a heavy sensation in her arms as well.  She denies any fever or chills. States she continues to feel fatigued. She states this is worse if she has to stand up or move around for long periods throughout the day. States she has been taking her thyroid medication as directed.   Past Medical History:  Diagnosis Date  . Asthma   . Chronic back pain   . Chronic daily headache   . Chronic neck pain   . Diabetes mellitus without complication ([place])   . Sciatica   . Thyroid disease     Patient Active Problem List   Diagnosis Date Noted  . CAP (community acquired pneumonia) 08/06/2015  . Hypertension 08/06/2015  . Metabolic syndrome [id number]  . Obesity 02/24/2015  . Metrorrhagia 02/24/2015  . Iron deficiency anemia 02/24/2015  . Glucose intolerance (impaired glucose tolerance) 02/21/2015  . Hypothyroidism 11/24/2014    Past Surgical History:  Procedure Laterality Date  . CESAREAN SECTION    . CHOLECYSTECTOMY    . TUBAL LIGATION      OB  History    Gravida Para Term Preterm AB Living   6 4 3 1 2 3    SAB TAB Ectopic Multiple Live Births   2       4       Home Medications    Prior to Admission medications   Medication Sig Start Date End Date Taking? Authorizing Provider  albuterol (PROVENTIL HFA;VENTOLIN HFA) 108 (90 Base) MCG/ACT inhaler Inhale 2 puffs into the lungs every 4 (four) hours as needed for wheezing or shortness of breath (cough, shortness of breath or wheezing.). 02/21/15  Yes Shawnee Knapp, MD  ferrous sulfate 325 (65 FE) MG tablet Take 1 tablet (325 mg total) by mouth every other day. 08/11/15  Yes Florencia Reasons, MD  ibuprofen (ADVIL,MOTRIN) 200 MG tablet Take 400 mg by mouth every 6 (six) hours as needed for headache, mild pain or moderate pain.   Yes Historical Provider, MD  levothyroxine (SYNTHROID, LEVOTHROID) 75 MCG tablet Take 1 tablet (75 mcg total) by mouth daily. 08/11/15  Yes Florencia Reasons, MD  acetaminophen (TYLENOL) 500 MG tablet Take 2 tablets (1,000 mg total) by mouth every 6 (six) hours as needed. Patient not taking: Reported on 08/28/2015 05/25/15   Charlesetta Shanks, MD  cyclobenzaprine (FLEXERIL) 10 MG tablet Take 1 tablet (10 mg total) by mouth 3 (three) times daily as needed for muscle spasms. When not at work. On  work days, just take 1 tab before bed Patient not taking: Reported on 05/25/2015 03/01/15   Shawnee Knapp, MD  guaiFENesin (MUCINEX) 600 MG 12 hr tablet Take 1 tablet (600 mg total) by mouth 2 (two) times daily. Patient not taking: Reported on 08/28/2015 08/11/15   Florencia Reasons, MD    Family History Family History  Problem Relation Age of Onset  . Diabetes Mother   . Hypertension Mother   . Thyroid disease Mother     Social History Social History  Substance Use Topics  . Smoking status: Never Smoker  . Smokeless tobacco: Not on file  . Alcohol use No     Allergies   Bactrim [sulfamethoxazole-trimethoprim] and Penicillins   Review of Systems Review of Systems  Constitutional: Positive for  fatigue.  Respiratory: Positive for cough and shortness of breath.   All other systems reviewed and are negative.    Physical Exam Updated Vital Signs BP 141/89 (BP Location: Right Arm)   Pulse 71   Temp 97.9 F (36.6 C) (Oral)   Resp 18   LMP 07/28/2015   SpO2 100%   Physical Exam  Constitutional: She is oriented to person, place, and time. She appears well-developed and well-nourished.  obese  HENT:  Head: Normocephalic and atraumatic.  Mouth/Throat: Oropharynx is clear and moist.  Eyes: Conjunctivae and EOM are normal. Pupils are equal, round, and reactive to light.  Neck: Normal range of motion.  Cardiovascular: Normal rate, regular rhythm and normal heart sounds.   Pulmonary/Chest: Effort normal and breath sounds normal. She has no wheezes. She has no rhonchi.  Lungs overall clear without wheezes rhonchi, no distress, speaking in full sentences without difficulty  Abdominal: Soft. Bowel sounds are normal.  Musculoskeletal: Normal range of motion.  Neurological: She is alert and oriented to person, place, and time.  Skin: Skin is warm and dry.  Psychiatric: She has a normal mood and affect.  Nursing note and vitals reviewed.    ED Treatments / Results  Labs (all labs ordered are listed, but only abnormal results are displayed) Labs Reviewed  CBC WITH DIFFERENTIAL/PLATELET - Abnormal; Notable for the following:       Result Value   Hemoglobin 10.9 (*)    HCT 34.2 (*)    RDW 15.8 (*)    All other components within normal limits  BASIC METABOLIC PANEL - Abnormal; Notable for the following:    Glucose, Bld 120 (*)    Calcium 8.5 (*)    All other components within normal limits  TROPONIN I    EKG  EKG Interpretation  Date/Time:  Monday August 28 2015 08:50:19 EDT Ventricular Rate:  79 PR Interval:    QRS Duration: 96 QT Interval:  379 QTC Calculation: 435 R Axis:   36 Text Interpretation:  Sinus rhythm Low voltage, precordial leads No significant change was  found Confirmed by CAMPOS  MD, Lennette Bihari ([id number]) on 08/28/2015 9:05:07 AM Also confirmed by Venora Maples  MD, Lennette Bihari ([id number]), editor [place] CT, Leda Gauze [phone])  on 08/28/2015 9:21:57 AM       Radiology Dg Chest 2 View  Result Date: 08/28/2015 CLINICAL DATA:  Continued nonproductive cough, pneumonia 3 weeks ago. EXAM: CHEST  2 VIEW COMPARISON:  CT 08/10/2015.  Plain films 08/08/2015. FINDINGS: Interval clearing of the lungs. No confluent airspace opacities. Heart is upper limits normal in size. No effusions or acute bony abnormality. IMPRESSION: Interval clearing of the previously seen upper lobe opacities. No focal airspace opacity currently. Electronically Signed  By: Rolm Baptise M.D.   On: 08/28/2015 08:46   Procedures Procedures (including critical care time)  Medications Ordered in ED Medications - No data to display   Initial Impression / Assessment and Plan / ED Course  I have reviewed the triage vital signs and the nursing notes.  Pertinent labs & imaging results that were available during my care of the patient were reviewed by me and considered in my medical decision making (see chart for details).  Clinical Course   45 year old female here with continued cough. He said pneumonia earlier this month requiring admission. She is afebrile and nontoxic. Her lungs are overall clear without wheezes or rhonchi. She also reports some generalized fatigue. She overall appears well my examination. She does have history of anemia. Basic labs were repeated, her hemoglobin today appears improved from during hospitalization. She has a normal white count. Her chest x-ray with resolution of previous pneumonia. Troponin is negative. EKG without any acute ischemia noted. Attempted to draw a TSH given her fatigue, however unable to be obtained after multiple tries here. On chart review her levels were normal during recent hospitalization. I feel this can be followed by her primary care doctor. Patient appears  stable for discharge home with supportive care. Rx Tessalon.   Discussed plan with patient, she acknowledged understanding and agreed with plan of care.  Return precautions given for new or worsening symptoms.  Final Clinical Impressions(s) / ED Diagnoses   Final diagnoses:  Cough    New Prescriptions Discharge Medication List as of 08/28/2015 10:47 AM    START taking these medications   Details  benzonatate (TESSALON PERLES) 100 MG capsule Take 1 capsule (100 mg total) by mouth 3 (three) times daily as needed for cough., Starting Mon 08/28/2015, Print         Larene Pickett, PA-C 08/28/15 1416    Jola Schmidt, MD 08/28/15 [phone]

## 2015-08-28 NOTE — ED Notes (Signed)
Attempted to collect labs successful. RN have been made aware

## 2015-08-28 NOTE — Discharge Instructions (Signed)
As we discussed her chest x-ray is clear today, no evidence of recurrent pneumonia. Your anemia seems to be improving. Take the prescribed medication as directed.  This should help with cough. Follow-up with your primary care doctor. Return to the ED for new or worsening symptoms.

## 2015-08-28 NOTE — ED Triage Notes (Addendum)
Pt reports continued nonproductive cough post diagnosis of pneumonia 3 weeks ago. With triage pt lung sounds clear, ambulatory to room without difficulty, speaking full sentences; generalized edema noted; hx of hypothyroidism; pt reports takes medication for such as prescribed.

## 2015-08-30 ENCOUNTER — Inpatient Hospital Stay: Payer: BLUE CROSS/BLUE SHIELD | Admitting: Pulmonary Disease

## 2015-09-06 NOTE — Telephone Encounter (Signed)
error 

## 2015-09-23 IMAGING — CT CT HEAD W/O CM
1 of 2 series · 16 of 30 positions shown, 20 images · non-contrast
Comparison: None

CLINICAL DATA: Right parietal temporal headache, photophobia

EXAM:
CT HEAD WITHOUT CONTRAST
TECHNIQUE: Contiguous axial images were obtained from the base of the skull
through the vertex without contrast.

[Series 3: head 2.0 h70h · axial · 0.44mm/px · z∈[-139,+7]mm · 16 of 83 slices shown, 20 images]
[im 5/83  brain]
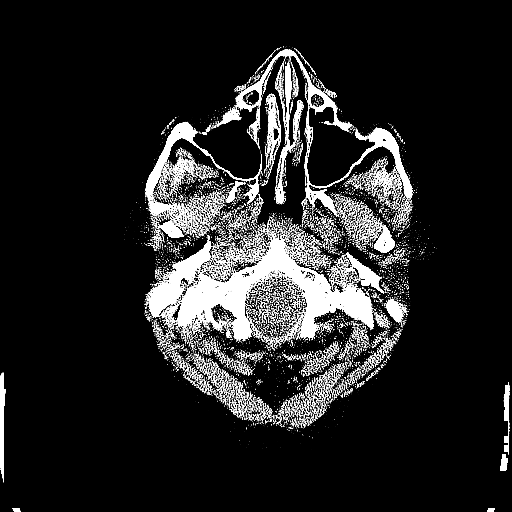
[im 5/83  bone]
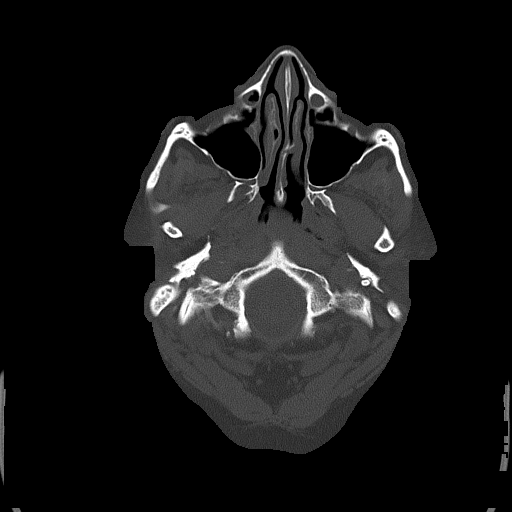
[im 9/83  brain]
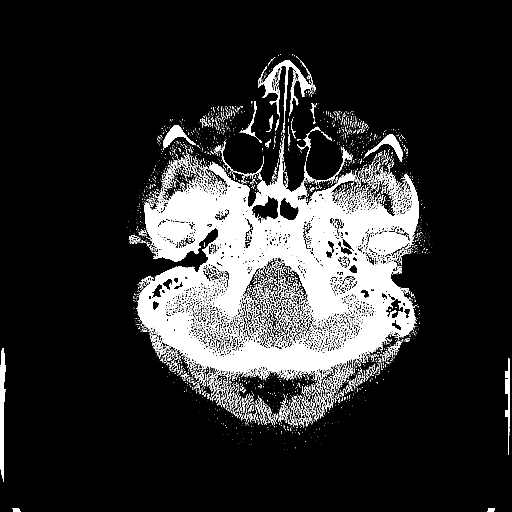
[im 13/83  brain]
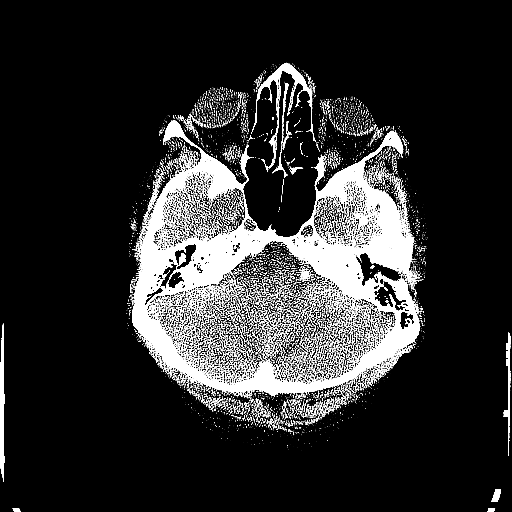
[im 21/83  brain]
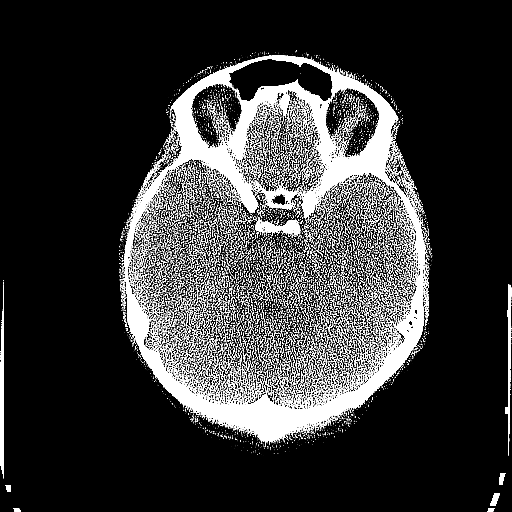
[im 25/83  brain]
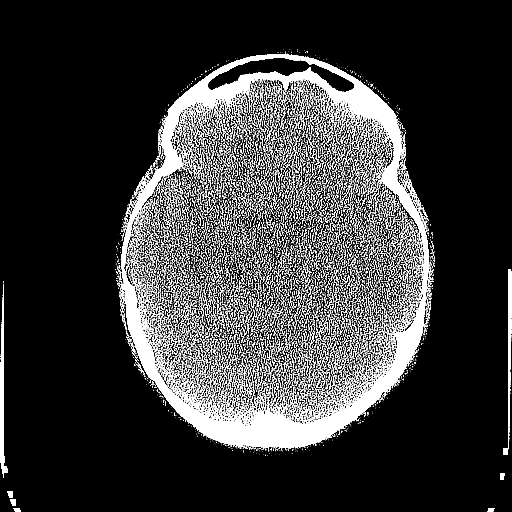
[im 25/83  bone]
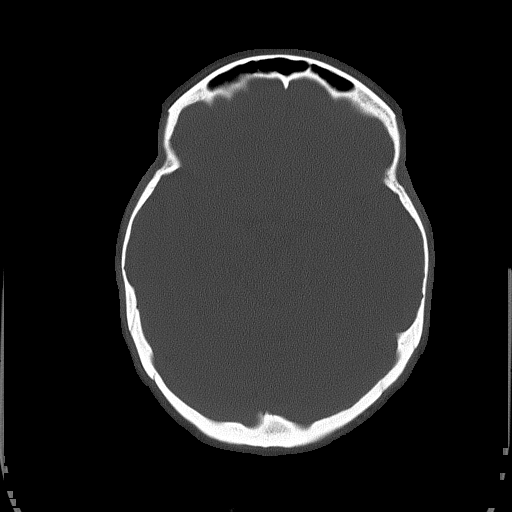
[im 29/83  brain]
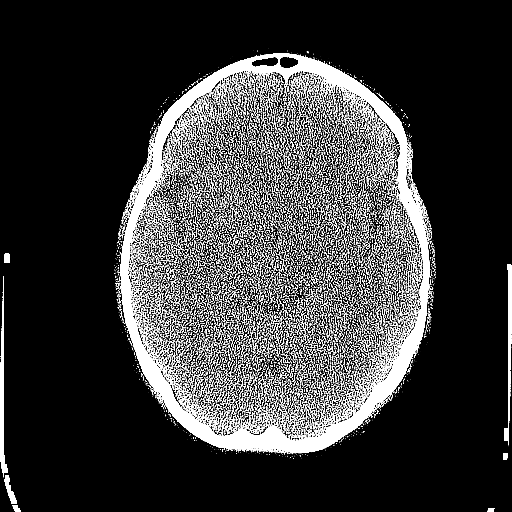
[im 33/83  brain]
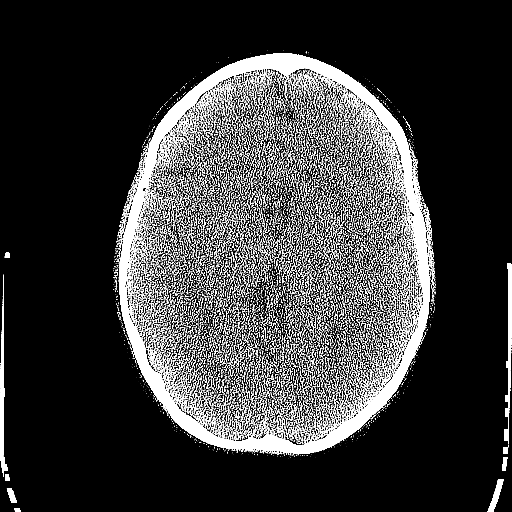
[im 37/83  brain]
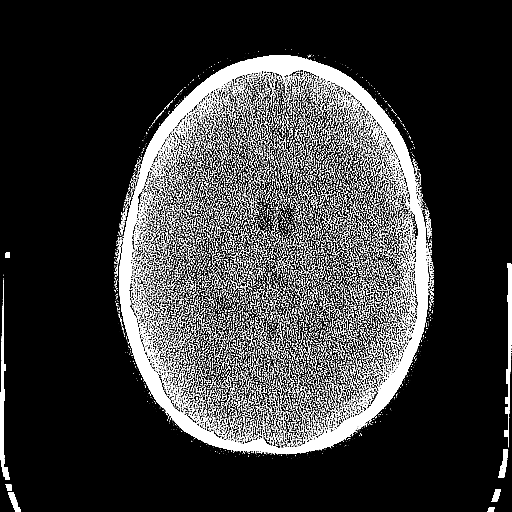
[im 46/83  brain]
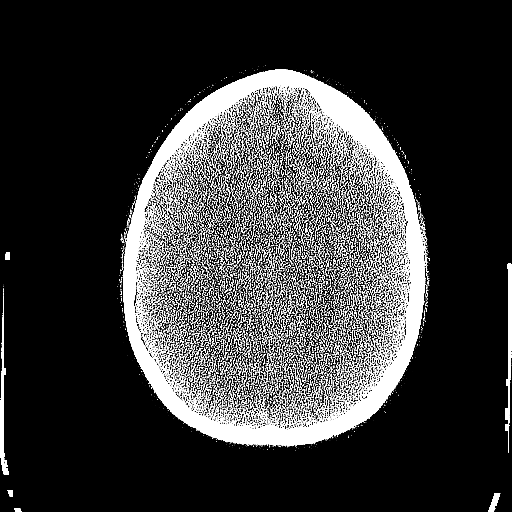
[im 46/83  bone]
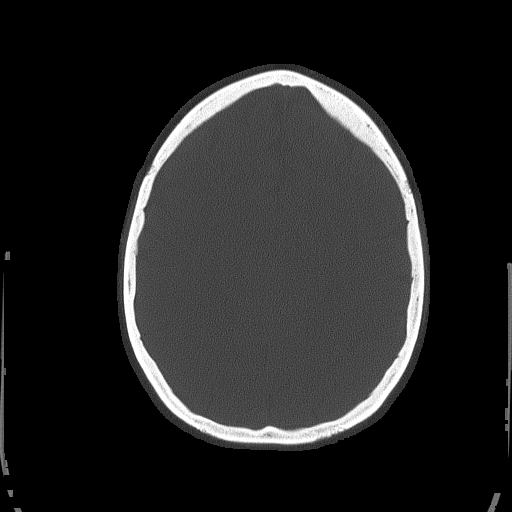
[im 50/83  brain]
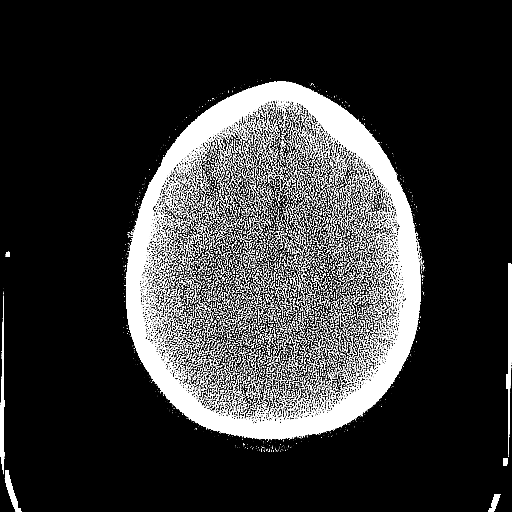
[im 54/83  brain]
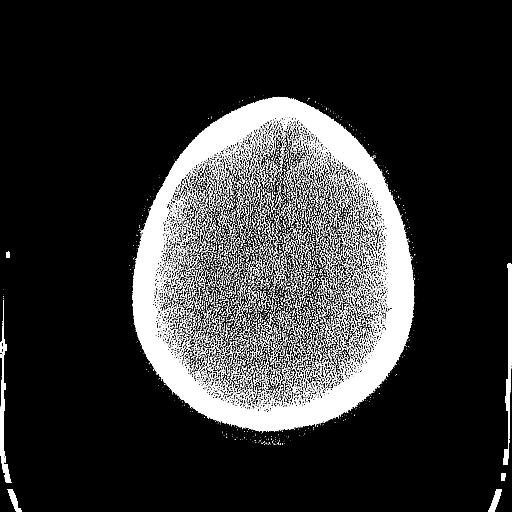
[im 58/83  brain]
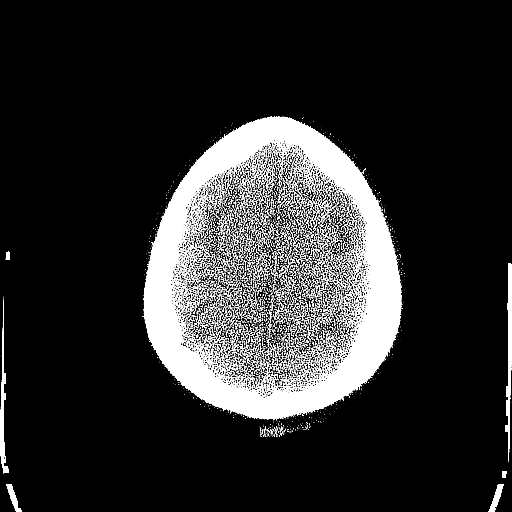
[im 62/83  brain]
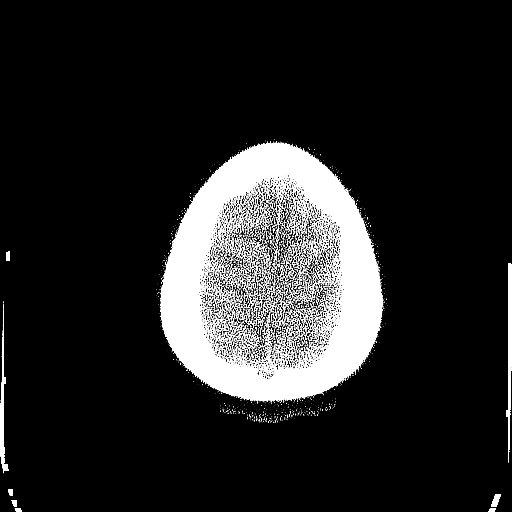
[im 62/83  bone]
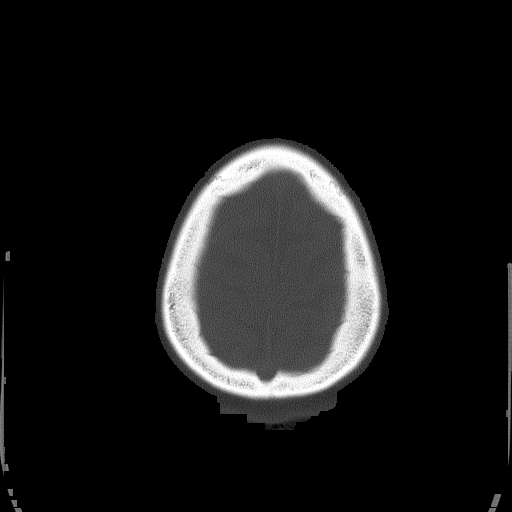
[im 70/83  brain]
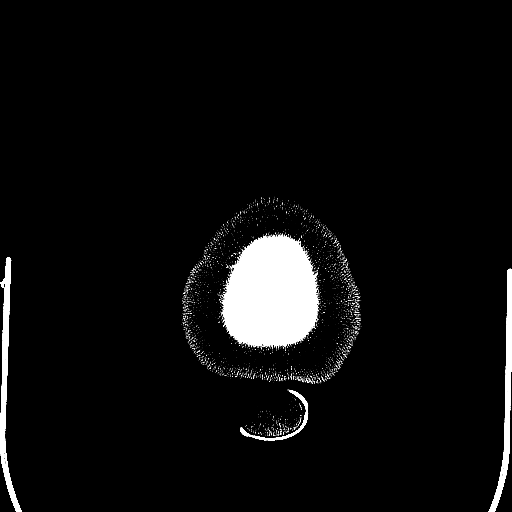
[im 74/83  brain]
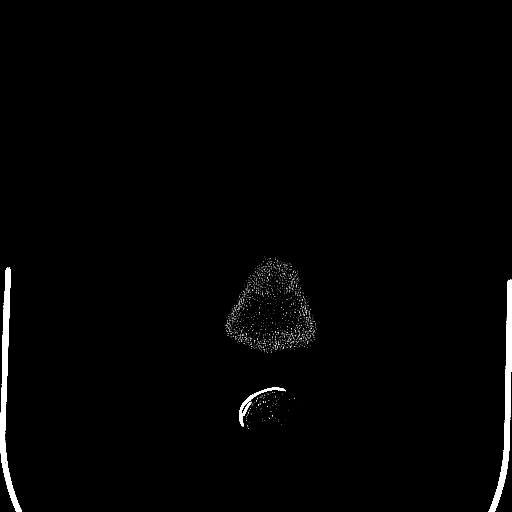
[im 78/83  brain]
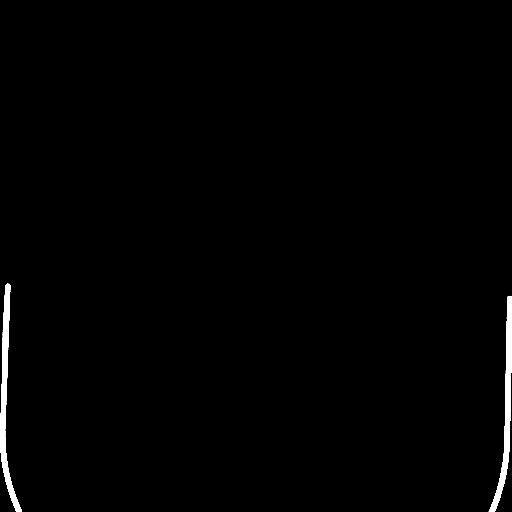

[16 of 30 positions shown; findings below may reference images not displayed]

FINDINGS: Normal appearance of the intracranial structures. No evidence for
acute hemorrhage, mass lesion, midline shift, hydrocephalus or large
infarct. No acute bony abnormality. The visualized sinuses are
clear.
IMPRESSION: No acute intracranial abnormality.

## 2016-06-12 ENCOUNTER — Encounter: Payer: Self-pay | Admitting: Gynecology

## 2016-06-14 ENCOUNTER — Encounter (HOSPITAL_COMMUNITY): Payer: Self-pay

## 2016-06-14 ENCOUNTER — Emergency Department (HOSPITAL_COMMUNITY)
Admission: EM | Admit: 2016-06-14 | Discharge: 2016-06-14 | Disposition: A | Discharge status: Home / Self Care | Payer: BLUE CROSS/BLUE SHIELD | Attending: Emergency Medicine | Admitting: Emergency Medicine

## 2016-06-14 DIAGNOSIS — E119 Type 2 diabetes mellitus without complications: Secondary | ICD-10-CM | POA: Diagnosis not present

## 2016-06-14 DIAGNOSIS — M542 Cervicalgia: Secondary | ICD-10-CM

## 2016-06-14 DIAGNOSIS — Z79899 Other long term (current) drug therapy: Secondary | ICD-10-CM | POA: Insufficient documentation

## 2016-06-14 DIAGNOSIS — J45909 Unspecified asthma, uncomplicated: Secondary | ICD-10-CM | POA: Insufficient documentation

## 2016-06-14 DIAGNOSIS — E039 Hypothyroidism, unspecified: Secondary | ICD-10-CM | POA: Diagnosis not present

## 2016-06-14 DIAGNOSIS — M5412 Radiculopathy, cervical region: Secondary | ICD-10-CM | POA: Diagnosis not present

## 2016-06-14 LAB — CBG MONITORING, ED: Glucose-Capillary: 143 mg/dL — ABNORMAL HIGH (ref 65–99)

## 2016-06-14 MED ORDER — CYCLOBENZAPRINE HCL 10 MG PO TABS: 10.0000 mg | ORAL_TABLET | Freq: Two times a day (BID) | ORAL | 0 refills | Status: DC | PRN

## 2016-06-14 NOTE — ED Triage Notes (Signed)
Pt states pain in neck, arm, left chest.  Down entire left side.  States it shoots down her arm.  Started 3 weeks ago but has gotten worse since Wednesday.  Some shortness of breath.  States she has chronic neck pain but nothing like this.  Pt states no injury noted.

## 2016-06-14 NOTE — ED Provider Notes (Signed)
[name]   CSN: [id number] Arrival date & time: 06/14/16  0709     History   Chief Complaint Chief Complaint  Patient presents with  . Arm Pain  . Chest Pain    HPI [name] is a 46 y.o. female.  HPI 46 y.o. Female  Complaining of one month of left neck pain radiating to left arm.  States ibuprofen was working but now "not working at all."  Patient denied history of chronic neck pain after questioned as history states chronic neck, back pain and chronic daily headache.  Denies numbness, tingling, weakness.  No known injury.   Patient with history diabetes supposed to be diet controlled.Last took ibuprofen last night. Denies weakness numbness,  CC states chest pain- patient states that it is in the chest as runs from neck to arm.  States low carbs and states 10 pound weight loss over 3 months.  BS run about 140 with bid checks.  Past Medical History:  Diagnosis Date  . Asthma   . Chronic back pain   . Chronic daily headache   . Chronic neck pain   . Diabetes mellitus without complication ([place])   . Sciatica   . Thyroid disease     Patient Active Problem List   Diagnosis Date Noted  . CAP (community acquired pneumonia) 08/06/2015  . Hypertension 08/06/2015  . Metabolic syndrome 95/63/8756  . Obesity 02/24/2015  . Metrorrhagia 02/24/2015  . Iron deficiency anemia 02/24/2015  . Glucose intolerance (impaired glucose tolerance) 02/21/2015  . Hypothyroidism 11/24/2014    Past Surgical History:  Procedure Laterality Date  . CESAREAN SECTION    . CHOLECYSTECTOMY    . TUBAL LIGATION      OB History    Gravida Para Term Preterm AB Living   6 4 3 1 2 3    SAB TAB Ectopic Multiple Live Births   2       4       Home Medications    Prior to Admission medications   Medication Sig Start Date End Date Taking? Authorizing Provider  acetaminophen (TYLENOL) 500 MG tablet Take 2 tablets (1,000 mg total) by mouth every 6 (six) hours as  needed. Patient not taking: Reported on 08/28/2015 05/25/15   Charlesetta Shanks, MD  albuterol (PROVENTIL HFA;VENTOLIN HFA) 108 (90 Base) MCG/ACT inhaler Inhale 2 puffs into the lungs every 4 (four) hours as needed for wheezing or shortness of breath (cough, shortness of breath or wheezing.). 02/21/15   Shawnee Knapp, MD  benzonatate (TESSALON PERLES) 100 MG capsule Take 1 capsule (100 mg total) by mouth 3 (three) times daily as needed for cough. 08/28/15   Larene Pickett, PA-C  cyclobenzaprine (FLEXERIL) 10 MG tablet Take 1 tablet (10 mg total) by mouth 3 (three) times daily as needed for muscle spasms. When not at work. On work days, just take 1 tab before bed Patient not taking: Reported on 05/25/2015 03/01/15   Shawnee Knapp, MD  ferrous sulfate 325 (65 FE) MG tablet Take 1 tablet (325 mg total) by mouth every other day. 08/11/15   Florencia Reasons, MD  guaiFENesin (MUCINEX) 600 MG 12 hr tablet Take 1 tablet (600 mg total) by mouth 2 (two) times daily. Patient not taking: Reported on 08/28/2015 08/11/15   Florencia Reasons, MD  ibuprofen (ADVIL,MOTRIN) 200 MG tablet Take 400 mg by mouth every 6 (six) hours as needed for headache, mild pain or moderate pain.    [provider]  levothyroxine (SYNTHROID, LEVOTHROID) 75 MCG tablet Take 1 tablet (75 mcg total) by mouth daily. 08/11/15   Florencia Reasons, MD    Family History Family History  Problem Relation Age of Onset  . Diabetes Mother   . Hypertension Mother   . Thyroid disease Mother     Social History Social History  Substance Use Topics  . Smoking status: Never Smoker  . Smokeless tobacco: Never Used  . Alcohol use No     Allergies   Bactrim [sulfamethoxazole-trimethoprim] and Penicillins   Review of Systems Review of Systems   Physical Exam Updated Vital Signs BP (!) 156/101 (BP Location: Right Arm)   Pulse 78   Temp 98.1 F (36.7 C) (Oral)   Resp 15   Ht 5\' 2"  (1.575 m)   Wt 210 lb (95.3 kg)   LMP  (LMP Unknown)   SpO2 100%   BMI 38.41  kg/m   Physical Exam  Constitutional: She is oriented to person, place, and time. She appears well-developed and well-nourished. No distress.  Recheck bp 130/68  HENT:  Head: Normocephalic and atraumatic.  Right Ear: External ear normal.  Left Ear: External ear normal.  Mouth/Throat: Oropharynx is clear and moist.  Eyes: EOM are normal. Pupils are equal, round, and reactive to light.  Neck: Normal range of motion. Neck supple. No JVD present. No tracheal deviation present. No thyromegaly present.  Cardiovascular: Normal rate and regular rhythm.   Pulmonary/Chest: Effort normal and breath sounds normal.  Abdominal: Soft. Bowel sounds are normal.  Musculoskeletal: Normal range of motion. She exhibits no edema.  Neurological: She is alert and oriented to person, place, and time. She displays normal reflexes. No cranial nerve deficit or sensory deficit. She exhibits normal muscle tone. Coordination normal.  Skin: Skin is warm. Capillary refill takes less than 2 seconds.  Psychiatric: She has a normal mood and affect.  Nursing note and vitals reviewed.    ED Treatments / Results  Labs (all labs ordered are listed, but only abnormal results are displayed) Labs Reviewed - No data to display  EKG  EKG Interpretation  Date/Time:  Friday Jun 14 2016 07:20:17 EDT Ventricular Rate:  77 PR Interval:    QRS Duration: 87 QT Interval:  391 QTC Calculation: 443 R Axis:   14 Text Interpretation:  Sinus rhythm Confirmed by Randal Buba, April ([id number]) on 06/14/2016 7:25:45 AM Also confirmed by Randal Buba, April ([id number]), editor Drema Pry [phone])  on 06/14/2016 7:50:32 AM       Radiology No results found.  Procedures Procedures (including critical care time)  Medications Ordered in ED Medications - No data to display   Initial Impression / Assessment and Plan / ED Course  I have reviewed the triage vital signs and the nursing notes.  Pertinent labs & imaging results that were  available during my care of the patient were reviewed by me and considered in my medical decision making (see chart for details).     Symptoms consistent with cervical radiculopathy. Patient with normal neurological exam. Plan to add Flexeril. She is given referral to Dr. Barbaraann Barthel for follow-up.  Final Clinical Impressions(s) / ED Diagnoses   Final diagnoses:  Cervical pain  Cervical radiculopathy    New Prescriptions New Prescriptions   CYCLOBENZAPRINE (FLEXERIL) 10 MG TABLET    Take 1 tablet (10 mg total) by mouth 2 (two) times daily as needed for muscle spasms.     Pattricia Boss, MD 06/14/16 [phone]

## 2016-11-03 ENCOUNTER — Emergency Department (HOSPITAL_COMMUNITY)
Admission: EM | Admit: 2016-11-03 | Discharge: 2016-11-03 | Disposition: A | Discharge status: Home / Self Care | Payer: BLUE CROSS/BLUE SHIELD | Attending: Emergency Medicine | Admitting: Emergency Medicine

## 2016-11-03 ENCOUNTER — Encounter (HOSPITAL_COMMUNITY): Payer: Self-pay | Admitting: Emergency Medicine

## 2016-11-03 DIAGNOSIS — I10 Essential (primary) hypertension: Secondary | ICD-10-CM | POA: Diagnosis not present

## 2016-11-03 DIAGNOSIS — R42 Dizziness and giddiness: Secondary | ICD-10-CM | POA: Diagnosis present

## 2016-11-03 DIAGNOSIS — E039 Hypothyroidism, unspecified: Secondary | ICD-10-CM | POA: Diagnosis not present

## 2016-11-03 DIAGNOSIS — J45909 Unspecified asthma, uncomplicated: Secondary | ICD-10-CM | POA: Diagnosis not present

## 2016-11-03 DIAGNOSIS — N390 Urinary tract infection, site not specified: Secondary | ICD-10-CM | POA: Insufficient documentation

## 2016-11-03 DIAGNOSIS — E119 Type 2 diabetes mellitus without complications: Secondary | ICD-10-CM | POA: Insufficient documentation

## 2016-11-03 LAB — I-STAT CHEM 8, ED
BUN: 11 mg/dL (ref 6–20)
CHLORIDE: 104 mmol/L (ref 101–111)
Calcium, Ion: 1.04 mmol/L — ABNORMAL LOW (ref 1.15–1.40)
Creatinine, Ser: 0.6 mg/dL (ref 0.44–1.00)
Glucose, Bld: 138 mg/dL — ABNORMAL HIGH (ref 65–99)
HEMATOCRIT: 30 % — AB (ref 36.0–46.0)
Hemoglobin: 10.2 g/dL — ABNORMAL LOW (ref 12.0–15.0)
Potassium: 3.3 mmol/L — ABNORMAL LOW (ref 3.5–5.1)
SODIUM: 139 mmol/L (ref 135–145)
TCO2: 24 mmol/L (ref 22–32)

## 2016-11-03 LAB — URINALYSIS, ROUTINE W REFLEX MICROSCOPIC
BILIRUBIN URINE: NEGATIVE
GLUCOSE, UA: NEGATIVE mg/dL
Ketones, ur: 5 mg/dL — AB
NITRITE: POSITIVE — AB
Protein, ur: 100 mg/dL — AB
SPECIFIC GRAVITY, URINE: 1.016 (ref 1.005–1.030)
pH: 6 (ref 5.0–8.0)

## 2016-11-03 LAB — I-STAT BETA HCG BLOOD, ED (MC, WL, AP ONLY): I-stat hCG, quantitative: <5 m[IU]/mL (ref <5)

## 2016-11-03 LAB — CBG MONITORING, ED: GLUCOSE-CAPILLARY: 121 mg/dL — AB (ref 65–99)

## 2016-11-03 MED ORDER — NITROFURANTOIN MONOHYD MACRO 100 MG PO CAPS: 100.0000 mg | ORAL_CAPSULE | Freq: Two times a day (BID) | ORAL | 0 refills | Status: DC

## 2016-11-03 MED ORDER — NITROFURANTOIN MONOHYD MACRO 100 MG PO CAPS
100.0000 mg | ORAL_CAPSULE | Freq: Once | ORAL | Status: AC
  Administered 2016-11-03: 100 mg via ORAL
  Filled 2016-11-03: qty 1

## 2016-11-03 MED ORDER — MECLIZINE HCL 25 MG PO TABS
25.0000 mg | ORAL_TABLET | Freq: Once | ORAL | Status: AC
  Administered 2016-11-03: 25 mg via ORAL
  Filled 2016-11-03: qty 1

## 2016-11-03 MED ORDER — MECLIZINE HCL 25 MG PO TABS: 25.0000 mg | ORAL_TABLET | Freq: Three times a day (TID) | ORAL | 0 refills | Status: DC | PRN

## 2016-11-03 MED ORDER — DIAZEPAM 5 MG PO TABS
5.0000 mg | ORAL_TABLET | Freq: Once | ORAL | Status: AC
  Administered 2016-11-03: 5 mg via ORAL
  Filled 2016-11-03: qty 1

## 2016-11-03 NOTE — ED Triage Notes (Signed)
Pt reports she has felt lightheaded since she woke up yesterday. No CP or SOB. Has felt nauseated and has had some diarrhea.

## 2016-11-03 NOTE — ED Provider Notes (Addendum)
[facility] DEPT Provider Note   CSN: [id number] Arrival date & time: 11/03/16  0746     History   Chief Complaint Chief Complaint  Patient presents with  . Dizziness    HPI [name] is a 46 y.o. female.  Patient is a 46 year old female with a history of diabetes, chronic back pain and thyroid disease who presents with dizziness. She states it started yesterday when she was getting up out of bed. She turned to get up out of bed and had a sudden onset of dizziness which she describes as a spinning sensation. She's had some nausea but no vomiting. She denies any headache. She denies any neck pain. No fevers. No recent head trauma. She states that the symptoms are worse when she stands up or turns her head from side to side. She denies any speech deficits. No vision changes other than when she's very dizzy her vision seems blurred. She denies any numbness or weakness to her extremities. No history of similar symptoms in the past. She has not taken anything for the symptoms. She does have a question as to whether or not she has urinary tract infection but she denies any symptoms of this other than some urinary frequency.      Past Medical History:  Diagnosis Date  . Asthma   . Chronic back pain   . Chronic daily headache   . Chronic neck pain   . Diabetes mellitus without complication ([place])   . Sciatica   . Thyroid disease     Patient Active Problem List   Diagnosis Date Noted  . CAP (community acquired pneumonia) 08/06/2015  . Hypertension 08/06/2015  . Metabolic syndrome 09/32/6712  . Obesity 02/24/2015  . Metrorrhagia 02/24/2015  . Iron deficiency anemia 02/24/2015  . Glucose intolerance (impaired glucose tolerance) 02/21/2015  . Hypothyroidism 11/24/2014    Past Surgical History:  Procedure Laterality Date  . CESAREAN SECTION    . CHOLECYSTECTOMY    . TUBAL LIGATION      OB History    Gravida Para Term Preterm AB Living   6 4 3 1 2 3    SAB TAB Ectopic  Multiple Live Births   2       4       Home Medications    Prior to Admission medications   Medication Sig Start Date End Date Taking? Authorizing Provider  meclizine (ANTIVERT) 25 MG tablet Take 1 tablet (25 mg total) by mouth 3 (three) times daily as needed for dizziness. 11/03/16   Malvin Johns, MD  nitrofurantoin, macrocrystal-monohydrate, (MACROBID) 100 MG capsule Take 1 capsule (100 mg total) by mouth 2 (two) times daily. X 7 days 11/03/16   Malvin Johns, MD    Family History Family History  Problem Relation Age of Onset  . Diabetes Mother   . Hypertension Mother   . Thyroid disease Mother     Social History Social History  Substance Use Topics  . Smoking status: Never Smoker  . Smokeless tobacco: Never Used  . Alcohol use No     Allergies   Bactrim [sulfamethoxazole-trimethoprim] and Penicillins   Review of Systems Review of Systems  Constitutional: Negative for chills, diaphoresis, fatigue and fever.  HENT: Negative for congestion, rhinorrhea and sneezing.   Eyes: Negative.   Respiratory: Negative for cough, chest tightness and shortness of breath.   Cardiovascular: Negative for chest pain and leg swelling.  Gastrointestinal: Positive for nausea. Negative for abdominal pain, blood in stool, diarrhea and vomiting.  Genitourinary: Positive for frequency. Negative for difficulty urinating, flank pain and hematuria.  Musculoskeletal: Negative for arthralgias and back pain.  Skin: Negative for rash.  Neurological: Positive for dizziness. Negative for speech difficulty, weakness, numbness and headaches.     Physical Exam Updated Vital Signs BP 126/73   Pulse (!) 59   Temp 97.6 F (36.4 C) (Oral)   Resp 16   Ht 5\' 2"  (1.575 m)   Wt 90.7 kg (200 lb)   SpO2 99%   BMI 36.58 kg/m   Physical Exam  Constitutional: She is oriented to person, place, and time. She appears well-developed and well-nourished.  HENT:  Head: Normocephalic and atraumatic.  Eyes:  Pupils are equal, round, and reactive to light.  No nystagmus  Neck: Normal range of motion. Neck supple.  Cardiovascular: Normal rate, regular rhythm and normal heart sounds.   Pulmonary/Chest: Effort normal and breath sounds normal. No respiratory distress. She has no wheezes. She has no rales. She exhibits no tenderness.  Abdominal: Soft. Bowel sounds are normal. There is no tenderness. There is no rebound and no guarding.  Musculoskeletal: Normal range of motion. She exhibits no edema.  Lymphadenopathy:    She has no cervical adenopathy.  Neurological: She is alert and oriented to person, place, and time.  Motor 5/5 all extremities Sensation grossly intact to LT all extremities Finger to Nose intact, no pronator drift CN II-XII grossly intact    Skin: Skin is warm and dry. No rash noted.  Psychiatric: She has a normal mood and affect.     ED Treatments / Results  Labs (all labs ordered are listed, but only abnormal results are displayed) Labs Reviewed  URINALYSIS, ROUTINE W REFLEX MICROSCOPIC - Abnormal; Notable for the following:       Result Value   APPearance HAZY (*)    Hgb urine dipstick LARGE (*)    Ketones, ur 5 (*)    Protein, ur 100 (*)    Nitrite POSITIVE (*)    Leukocytes, UA LARGE (*)    Bacteria, UA MANY (*)    Squamous Epithelial / LPF 6-30 (*)    All other components within normal limits  CBG MONITORING, ED - Abnormal; Notable for the following:    Glucose-Capillary 121 (*)    All other components within normal limits  I-STAT CHEM 8, ED - Abnormal; Notable for the following:    Potassium 3.3 (*)    Glucose, Bld 138 (*)    Calcium, Ion 1.04 (*)    Hemoglobin 10.2 (*)    HCT 30.0 (*)    All other components within normal limits  URINE CULTURE  I-STAT BETA HCG BLOOD, ED ([facility], [facility], [facility] ONLY)    EKG  EKG Interpretation  Date/Time:  Sunday November 03 2016 07:56:32 EDT Ventricular Rate:  75 PR Interval:    QRS Duration: 82 QT Interval:  402 QTC  Calculation: 449 R Axis:   38 Text Interpretation:  Sinus rhythm Low voltage, precordial leads Baseline wander in lead(s) I II aVR No significant change since last tracing Confirmed by Dorie Rank [phone]) on 11/03/2016 7:59:03 AM       Radiology No results found.  Procedures Procedures (including critical care time)  Medications Ordered in ED Medications  meclizine (ANTIVERT) tablet 25 mg (25 mg Oral Given 11/03/16 0916)  diazepam (VALIUM) tablet 5 mg (5 mg Oral Given 11/03/16 1203)  nitrofurantoin (macrocrystal-monohydrate) (MACROBID) capsule 100 mg (100 mg Oral Given 11/03/16 1330)     Initial  Impression / Assessment and Plan / ED Course  I have reviewed the triage vital signs and the nursing notes.  Pertinent labs & imaging results that were available during my care of the patient were reviewed by me and considered in my medical decision making (see chart for details).    Patient is 46 year old female who presents with dizziness. Her dizziness sounds consistent with vertigo although she doesn't have any nystagmus. She doesn't have any associated headache or neck pain which would be more concerning for an intracranial process or carotid dissection. She doesn't have any neurologic deficits which would also be more concerning for a central etiology for the vertigo. Her symptoms have improved somewhat with meclizine and Ativan. She is able to ambulate without ataxia although she still complains of dizziness. She's had no ongoing vomiting. Her hgb was slightly low but similar to prior values.  No arrhythmias noted on ECG. Her urinalysis does show evidence of a urinary tract infection. She does have some blood in it as well but she is on her menstrual cycle. I reviewed her old cultures and her last culture was pan resistant although was susceptible to [place]. I will start her on Macrobid. She was encouraged to have close follow-up with her primary care physician. Return precautions were  given.`  Final Clinical Impressions(s) / ED Diagnoses   Final diagnoses:  Vertigo  Lower urinary tract infectious disease    New Prescriptions New Prescriptions   MECLIZINE (ANTIVERT) 25 MG TABLET    Take 1 tablet (25 mg total) by mouth 3 (three) times daily as needed for dizziness.   NITROFURANTOIN, MACROCRYSTAL-MONOHYDRATE, (MACROBID) 100 MG CAPSULE    Take 1 capsule (100 mg total) by mouth 2 (two) times daily. X 7 days     Malvin Johns, MD 11/03/16 1340    Malvin Johns, MD 11/03/16 1342

## 2016-11-03 NOTE — ED Notes (Signed)
Pt unable to provide urine specimen at this time

## 2016-11-03 NOTE — ED Notes (Signed)
Waiting for Ultrasound IV. Pt is hard stick and stated she usually requires ultrasound IV.

## 2016-11-03 NOTE — ED Notes (Signed)
Pt stated that nausea and dizziness started yesterday and that this is a new occurrence. Pt denies pain, SOB and chest pain.

## 2016-11-05 ENCOUNTER — Encounter (HOSPITAL_COMMUNITY): Payer: Self-pay | Admitting: Emergency Medicine

## 2016-11-05 DIAGNOSIS — J45909 Unspecified asthma, uncomplicated: Secondary | ICD-10-CM | POA: Diagnosis not present

## 2016-11-05 DIAGNOSIS — E119 Type 2 diabetes mellitus without complications: Secondary | ICD-10-CM | POA: Diagnosis not present

## 2016-11-05 DIAGNOSIS — Z79899 Other long term (current) drug therapy: Secondary | ICD-10-CM | POA: Diagnosis not present

## 2016-11-05 DIAGNOSIS — E039 Hypothyroidism, unspecified: Secondary | ICD-10-CM | POA: Diagnosis not present

## 2016-11-05 DIAGNOSIS — Z5321 Procedure and treatment not carried out due to patient leaving prior to being seen by health care provider: Secondary | ICD-10-CM | POA: Insufficient documentation

## 2016-11-05 DIAGNOSIS — R42 Dizziness and giddiness: Secondary | ICD-10-CM | POA: Diagnosis not present

## 2016-11-05 DIAGNOSIS — I10 Essential (primary) hypertension: Secondary | ICD-10-CM | POA: Diagnosis not present

## 2016-11-05 LAB — URINE CULTURE: Culture: >=100000 — AB

## 2016-11-05 NOTE — ED Triage Notes (Signed)
Patient is complaining dizziness and upper back pain. Patient states she was here on Saturday and was given medication. She states it is not working.

## 2016-11-06 ENCOUNTER — Emergency Department (HOSPITAL_COMMUNITY): Payer: BLUE CROSS/BLUE SHIELD

## 2016-11-06 ENCOUNTER — Telehealth: Payer: Self-pay | Admitting: Emergency Medicine

## 2016-11-06 ENCOUNTER — Encounter (HOSPITAL_COMMUNITY): Payer: Self-pay | Admitting: Radiology

## 2016-11-06 ENCOUNTER — Emergency Department (HOSPITAL_COMMUNITY)
Admission: EM | Admit: 2016-11-06 | Discharge: 2016-11-06 | Disposition: A | Discharge status: Home / Self Care | Payer: BLUE CROSS/BLUE SHIELD | Attending: Emergency Medicine | Admitting: Emergency Medicine

## 2016-11-06 ENCOUNTER — Emergency Department (HOSPITAL_COMMUNITY)
Admission: EM | Admit: 2016-11-06 | Discharge: 2016-11-06 | Discharge status: Against Medical Advice | Payer: BLUE CROSS/BLUE SHIELD | Source: Home / Self Care

## 2016-11-06 DIAGNOSIS — R42 Dizziness and giddiness: Secondary | ICD-10-CM

## 2016-11-06 DIAGNOSIS — E039 Hypothyroidism, unspecified: Secondary | ICD-10-CM | POA: Insufficient documentation

## 2016-11-06 DIAGNOSIS — J45909 Unspecified asthma, uncomplicated: Secondary | ICD-10-CM | POA: Insufficient documentation

## 2016-11-06 DIAGNOSIS — E119 Type 2 diabetes mellitus without complications: Secondary | ICD-10-CM | POA: Insufficient documentation

## 2016-11-06 DIAGNOSIS — Z79899 Other long term (current) drug therapy: Secondary | ICD-10-CM | POA: Insufficient documentation

## 2016-11-06 DIAGNOSIS — I10 Essential (primary) hypertension: Secondary | ICD-10-CM | POA: Insufficient documentation

## 2016-11-06 LAB — I-STAT CHEM 8, ED
BUN: 10 mg/dL (ref 6–20)
CREATININE: 0.5 mg/dL (ref 0.44–1.00)
Calcium, Ion: 1.13 mmol/L — ABNORMAL LOW (ref 1.15–1.40)
Chloride: 103 mmol/L (ref 101–111)
Glucose, Bld: 96 mg/dL (ref 65–99)
HEMATOCRIT: 36 % (ref 36.0–46.0)
HEMOGLOBIN: 12.2 g/dL (ref 12.0–15.0)
POTASSIUM: 4 mmol/L (ref 3.5–5.1)
Sodium: 139 mmol/L (ref 135–145)
TCO2: 25 mmol/L (ref 22–32)

## 2016-11-06 MED ORDER — DIPHENHYDRAMINE HCL 50 MG/ML IJ SOLN
25.0000 mg | Freq: Once | INTRAMUSCULAR | Status: AC
  Administered 2016-11-06: 25 mg via INTRAVENOUS
  Filled 2016-11-06: qty 1

## 2016-11-06 MED ORDER — ONDANSETRON HCL 4 MG/2ML IJ SOLN
4.0000 mg | Freq: Once | INTRAMUSCULAR | Status: AC
  Administered 2016-11-06: 4 mg via INTRAVENOUS
  Filled 2016-11-06: qty 2

## 2016-11-06 MED ORDER — DIAZEPAM 5 MG PO TABS: 5.0000 mg | ORAL_TABLET | Freq: Three times a day (TID) | ORAL | 0 refills | Status: DC | PRN

## 2016-11-06 MED ORDER — LORAZEPAM 2 MG/ML IJ SOLN
2.0000 mg | Freq: Once | INTRAMUSCULAR | Status: AC
  Administered 2016-11-06: 2 mg via INTRAVENOUS
  Filled 2016-11-06: qty 1

## 2016-11-06 MED ORDER — ONDANSETRON 4 MG PO TBDP: 4.0000 mg | ORAL_TABLET | Freq: Three times a day (TID) | ORAL | 0 refills | Status: DC | PRN

## 2016-11-06 NOTE — ED Notes (Signed)
Called patient x 3 and no answer.

## 2016-11-06 NOTE — ED Notes (Signed)
No answer when called for room 

## 2016-11-06 NOTE — ED Triage Notes (Signed)
Pt c/o ongoing vertigo. Pt states medications do not relief vertigo. Pt also c/o chronic upper back pain.

## 2016-11-06 NOTE — ED Notes (Signed)
I called patient name for lab collect and no one responded

## 2016-11-06 NOTE — ED Notes (Signed)
Patient transported to CT 

## 2016-11-06 NOTE — Telephone Encounter (Signed)
Post ED Visit - Positive Culture Follow-up  Culture report reviewed by antimicrobial stewardship pharmacist:  []  Elenor Quinones, Pharm.D. [x]  Heide Guile, Pharm.D., BCPS AQ-ID []  Parks Neptune, Pharm.D., BCPS []  Alycia Rossetti, Pharm.D., BCPS []  [place], [place].D., BCPS, AAHIVP []  Legrand Como, Pharm.D., BCPS, AAHIVP []  Salome Arnt, PharmD, BCPS []  Dimitri Ped, PharmD, BCPS []  Vincenza Hews, PharmD, BCPS  Positive urine culture Treated with nitrofurantoin, organism sensitive to the same and no further patient follow-up is required at this time.  [name] 11/06/2016, 12:38 PM

## 2016-11-06 NOTE — ED Provider Notes (Signed)
[facility] DEPT Provider Note   CSN: [id number] Arrival date & time: 11/06/16  1045     History   Chief Complaint Chief Complaint  Patient presents with  . Dizziness    HPI [name] is a 46 y.o. female. Chief complaint is "I still have vertigo".  HPI:  46 year old female. Evaluated here 3 days ago. Had what sounds like acute peripheral vertigo. Symptoms for 48 hours. Spinning sensation. Positional. Improved with medications. Discharged on B[place]. No better, no worse. Persistent symptoms. No visual loss. No hearing loss. No tendinitis. No fever and no recent URI symptoms. No recent head trauma.  Past Medical History:  Diagnosis Date  . Asthma   . Chronic back pain   . Chronic daily headache   . Chronic neck pain   . Diabetes mellitus without complication ([place])   . Sciatica   . Thyroid disease     Patient Active Problem List   Diagnosis Date Noted  . CAP (community acquired pneumonia) 08/06/2015  . Hypertension 08/06/2015  . Metabolic syndrome 40/98/1191  . Obesity 02/24/2015  . Metrorrhagia 02/24/2015  . Iron deficiency anemia 02/24/2015  . Glucose intolerance (impaired glucose tolerance) 02/21/2015  . Hypothyroidism 11/24/2014    Past Surgical History:  Procedure Laterality Date  . CESAREAN SECTION    . CHOLECYSTECTOMY    . TUBAL LIGATION      OB History    Gravida Para Term Preterm AB Living   6 4 3 1 2 3    SAB TAB Ectopic Multiple Live Births   2       4       Home Medications    Prior to Admission medications   Medication Sig Start Date End Date Taking? Authorizing Provider  meclizine (ANTIVERT) 25 MG tablet Take 1 tablet (25 mg total) by mouth 3 (three) times daily as needed for dizziness. 11/03/16  Yes Malvin Johns, MD  nitrofurantoin, macrocrystal-monohydrate, (MACROBID) 100 MG capsule Take 1 capsule (100 mg total) by mouth 2 (two) times daily. X 7 days 11/03/16  Yes Malvin Johns, MD  diazepam (VALIUM) 5 MG tablet Take 1 tablet (5 mg  total) by mouth every 8 (eight) hours as needed for anxiety (muscle spasm). 11/06/16   Tanna Furry, MD  ondansetron (ZOFRAN ODT) 4 MG disintegrating tablet Take 1 tablet (4 mg total) by mouth every 8 (eight) hours as needed for nausea. 11/06/16   Tanna Furry, MD    Family History Family History  Problem Relation Age of Onset  . Diabetes Mother   . Hypertension Mother   . Thyroid disease Mother     Social History Social History  Substance Use Topics  . Smoking status: Never Smoker  . Smokeless tobacco: Never Used  . Alcohol use No     Allergies   Bactrim [sulfamethoxazole-trimethoprim] and Penicillins   Review of Systems Review of Systems  Constitutional: Negative for appetite change, chills, diaphoresis, fatigue and fever.  HENT: Negative for mouth sores, sore throat and trouble swallowing.   Eyes: Negative for visual disturbance.  Respiratory: Negative for cough, chest tightness, shortness of breath and wheezing.   Cardiovascular: Negative for chest pain.  Gastrointestinal: Negative for abdominal distention, abdominal pain, diarrhea, nausea and vomiting.  Endocrine: Negative for polydipsia, polyphagia and polyuria.  Genitourinary: Negative for dysuria, frequency and hematuria.  Musculoskeletal: Negative for gait problem.  Skin: Negative for color change, pallor and rash.  Neurological: Positive for dizziness. Negative for syncope, light-headedness and headaches.  Hematological: Does not bruise/bleed easily.  Psychiatric/Behavioral: Negative for behavioral problems and confusion.     Physical Exam Updated Vital Signs BP (!) 154/74 (BP Location: Left Arm)   Pulse 74   Temp 98.4 F (36.9 C) (Oral)   Resp 16   LMP 10/31/2016   SpO2 100%   Physical Exam  Constitutional: She is oriented to person, place, and time. She appears well-developed and well-nourished. No distress.  HENT:  Head: Normocephalic.  Nystagmus with lateral to lateral gaze. Horizontal. No vertical  or rotatory nystagmus. Remainder of cranial nerves intact symmetric.  Eyes: Pupils are equal, round, and reactive to light. Conjunctivae are normal. No scleral icterus.  Neck: Normal range of motion. Neck supple. No thyromegaly present.  Cardiovascular: Normal rate and regular rhythm.  Exam reveals no gallop and no friction rub.   No murmur heard. Pulmonary/Chest: Effort normal and breath sounds normal. No respiratory distress. She has no wheezes. She has no rales.  Abdominal: Soft. Bowel sounds are normal. She exhibits no distension. There is no tenderness. There is no rebound.  Musculoskeletal: Normal range of motion.  Neurological: She is alert and oriented to person, place, and time.  Skin: Skin is warm and dry. No rash noted.  Psychiatric: She has a normal mood and affect. Her behavior is normal.     ED Treatments / Results  Labs (all labs ordered are listed, but only abnormal results are displayed) Labs Reviewed  I-STAT CHEM 8, ED - Abnormal; Notable for the following:       Result Value   Calcium, Ion 1.13 (*)    All other components within normal limits    EKG  EKG Interpretation None       Radiology Ct Head Wo Contrast  Result Date: 11/06/2016 CLINICAL DATA:  46 year old female with a history of vertigo EXAM: CT HEAD WITHOUT CONTRAST TECHNIQUE: Contiguous axial images were obtained from the base of the skull through the vertex without intravenous contrast. COMPARISON:  None. FINDINGS: Brain: No acute intracranial hemorrhage. No midline shift or mass effect. Gray-white differentiation maintained. Unremarkable appearance of the ventricular system. Vascular: Unremarkable. Skull: No acute fracture.  No aggressive bone lesion identified. Sinuses/Orbits: Unremarkable appearance of the orbits. Mastoid air cells clear. No middle ear effusion. No significant sinus disease. Other: None IMPRESSION: No CT evidence of acute intracranial abnormality Electronically Signed   By: Corrie Mckusick D.O.   On: 11/06/2016 13:31    Procedures Procedures (including critical care time)  Medications Ordered in ED Medications  diphenhydrAMINE (BENADRYL) injection 25 mg (25 mg Intravenous Given 11/06/16 1316)  ondansetron (ZOFRAN) injection 4 mg (4 mg Intravenous Given 11/06/16 1316)  LORazepam (ATIVAN) injection 2 mg (2 mg Intravenous Given 11/06/16 1316)     Initial Impression / Assessment and Plan / ED Course  I have reviewed the triage vital signs and the nursing notes.  Pertinent labs & imaging results that were available during my care of the patient were reviewed by me and considered in my medical decision making (see chart for details).     After 5 days of symptoms, this still sounds like peripheral vertigo. Head CT shows no sign of ischemia or infarct. No mass or other abnormalities noted. Given IV Ativan and Benadryl. Her symptoms are improving. Plan is home. ENT follow-up. Continue when necessary meclizine. Valium when necessary for sleep. Zofran when necessary nausea. Work note.  Final Clinical Impressions(s) / ED Diagnoses   Final diagnoses:  Vertigo    New Prescriptions New Prescriptions   DIAZEPAM (VALIUM)  5 MG TABLET    Take 1 tablet (5 mg total) by mouth every 8 (eight) hours as needed for anxiety (muscle spasm).   ONDANSETRON (ZOFRAN ODT) 4 MG DISINTEGRATING TABLET    Take 1 tablet (4 mg total) by mouth every 8 (eight) hours as needed for nausea.     Tanna Furry, MD 11/06/16 [phone]

## 2016-11-06 NOTE — Discharge Instructions (Signed)
Take meclizine every 8 hours for vertigo. Take valium every 12 hours as needed for vertigo, or for sleep. Call Dr. Archie Patten) for appointment

## 2016-12-31 ENCOUNTER — Ambulatory Visit: Payer: BLUE CROSS/BLUE SHIELD | Admitting: Neurology

## 2016-12-31 ENCOUNTER — Telehealth: Payer: Self-pay | Admitting: *Deleted

## 2016-12-31 NOTE — Telephone Encounter (Signed)
Patient noshowed new patient appt. with Dr. Felecia Shelling.  Please do not reschedule with Dr. Arlean Hopping

## 2017-01-01 ENCOUNTER — Encounter: Payer: Self-pay | Admitting: Neurology

## 2017-07-11 IMAGING — CR DG ABDOMEN 1V
2 series · 2 of 2 positions shown · non-contrast
Comparison: CT 12/25/2013.

CLINICAL DATA: Abdominal pain.

EXAM:
ABDOMEN - 1 VIEW

[AP (1 of 2)]
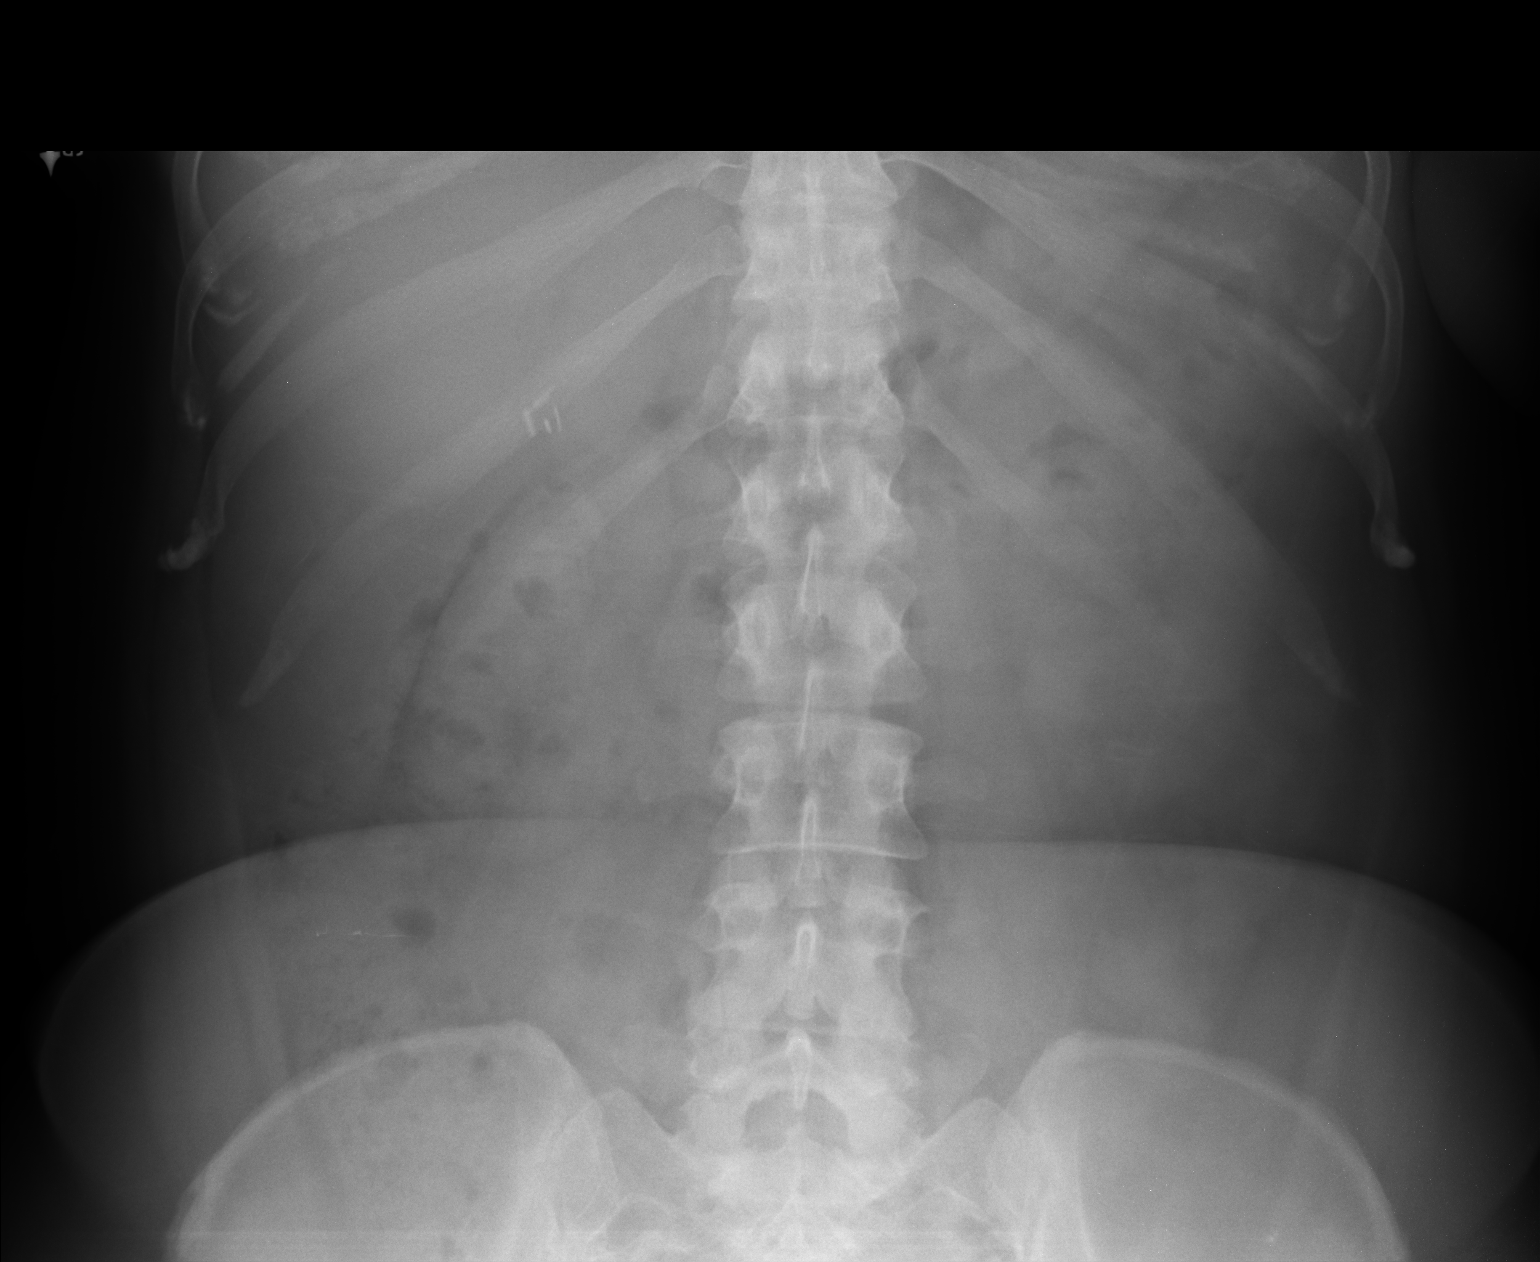

[AP (2 of 2)]
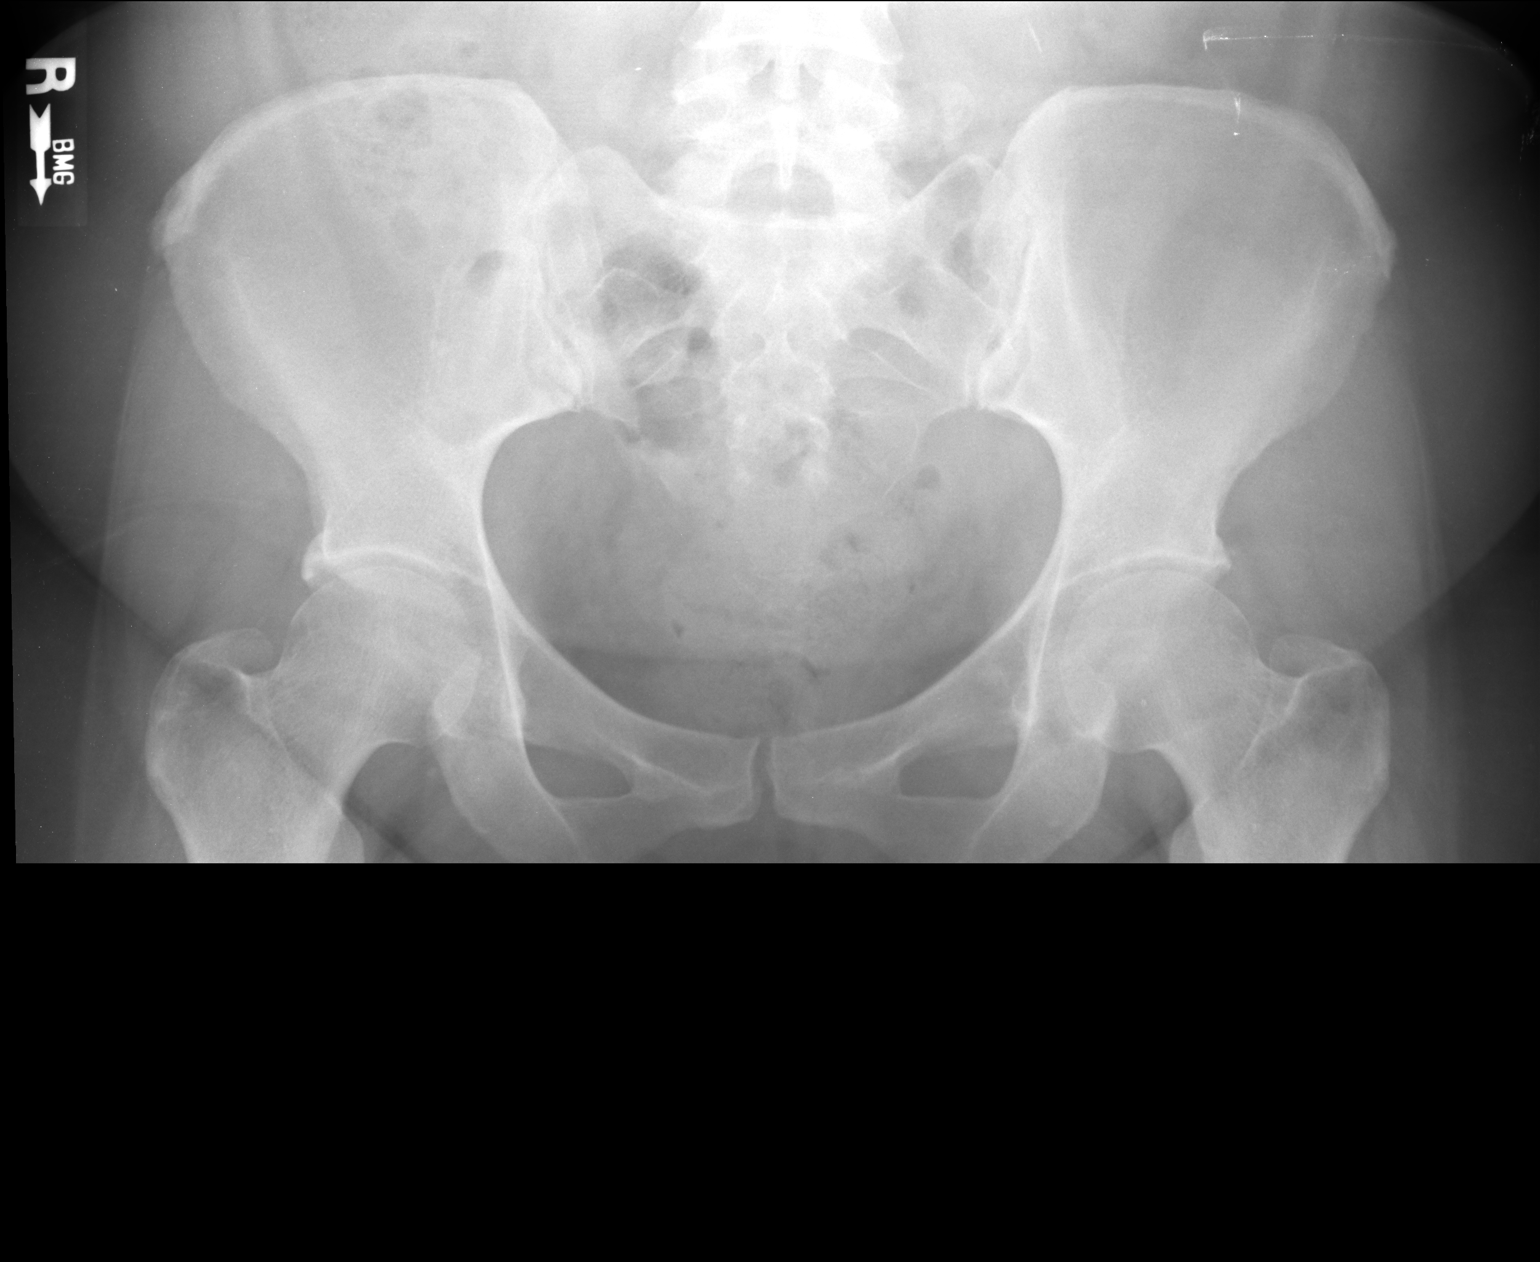

[2 of 2 positions shown; findings below may reference images not displayed]

FINDINGS: Surgical clips right upper quadrant. No bowel distention. No free
air. Stool noted throughout the colon. No pathologic intra-abdominal
calcification. No acute bony abnormality .
IMPRESSION: Stool noted throughout the colon. Constipation cannot be excluded.
No bowel distention or free air.

## 2017-08-14 ENCOUNTER — Other Ambulatory Visit: Payer: Self-pay

## 2017-08-14 ENCOUNTER — Emergency Department (HOSPITAL_COMMUNITY): Payer: BLUE CROSS/BLUE SHIELD

## 2017-08-14 ENCOUNTER — Encounter (HOSPITAL_COMMUNITY): Payer: Self-pay

## 2017-08-14 ENCOUNTER — Emergency Department (HOSPITAL_COMMUNITY)
Admission: EM | Admit: 2017-08-14 | Discharge: 2017-08-14 | Disposition: A | Discharge status: Home / Self Care | Payer: BLUE CROSS/BLUE SHIELD | Attending: Emergency Medicine | Admitting: Emergency Medicine

## 2017-08-14 DIAGNOSIS — E119 Type 2 diabetes mellitus without complications: Secondary | ICD-10-CM | POA: Diagnosis not present

## 2017-08-14 DIAGNOSIS — E039 Hypothyroidism, unspecified: Secondary | ICD-10-CM | POA: Insufficient documentation

## 2017-08-14 DIAGNOSIS — J40 Bronchitis, not specified as acute or chronic: Secondary | ICD-10-CM | POA: Diagnosis not present

## 2017-08-14 DIAGNOSIS — Z79899 Other long term (current) drug therapy: Secondary | ICD-10-CM | POA: Diagnosis not present

## 2017-08-14 DIAGNOSIS — R05 Cough: Secondary | ICD-10-CM | POA: Diagnosis present

## 2017-08-14 MED ORDER — ALBUTEROL SULFATE (2.5 MG/3ML) 0.083% IN NEBU
5.0000 mg | INHALATION_SOLUTION | Freq: Once | RESPIRATORY_TRACT | Status: AC
  Administered 2017-08-14: 5 mg via RESPIRATORY_TRACT
  Filled 2017-08-14: qty 6

## 2017-08-14 MED ORDER — GUAIFENESIN ER 1200 MG PO TB12: 1.0000 | ORAL_TABLET | Freq: Two times a day (BID) | ORAL | 0 refills | Status: DC

## 2017-08-14 MED ORDER — PREDNISONE 50 MG PO TABS: 50.0000 mg | ORAL_TABLET | Freq: Every day | ORAL | 0 refills | Status: DC

## 2017-08-14 NOTE — Discharge Instructions (Addendum)
Return here as needed. °

## 2017-08-14 NOTE — ED Provider Notes (Signed)
[facility] EMERGENCY DEPARTMENT Provider Note   CSN: [id number] Arrival date & time: 08/14/17  0451     History   Chief Complaint Chief Complaint  Patient presents with  . Shortness of Breath    HPI [name] is a 47 y.o. female.  HPI Patient presents to the emergency department with cough with nasal congestion and drainage that started yesterday.  The patient states she is also had some mild shortness of breath associated with this.  She states she does have a history of asthma.  Patient states she does not currently have her inhalers.  Patient states that nothing to make the condition better or worse.  Patient did not take any medications prior to arrival.  The patient denies chest pain, shortness of breath, headache,blurred vision, neck pain, fever,weakness, numbness, dizziness, anorexia, edema, abdominal pain, nausea, vomiting, diarrhea, rash, back pain, dysuria, hematemesis, bloody stool, near syncope, or syncope. Past Medical History:  Diagnosis Date  . Asthma   . Chronic back pain   . Chronic daily headache   . Chronic neck pain   . Diabetes mellitus without complication ([place])   . Sciatica   . Thyroid disease     Patient Active Problem List   Diagnosis Date Noted  . CAP (community acquired pneumonia) 08/06/2015  . Hypertension 08/06/2015  . Metabolic syndrome 70/62/3762  . Obesity 02/24/2015  . Metrorrhagia 02/24/2015  . Iron deficiency anemia 02/24/2015  . Glucose intolerance (impaired glucose tolerance) 02/21/2015  . Hypothyroidism 11/24/2014    Past Surgical History:  Procedure Laterality Date  . CESAREAN SECTION    . CHOLECYSTECTOMY    . TUBAL LIGATION       OB History    Gravida  6   Para  4   Term  3   Preterm  1   AB  2   Living  3     SAB  2   TAB      Ectopic      Multiple      Live Births  4            Home Medications    Prior to Admission medications   Medication Sig Start Date End Date Taking?  Authorizing Provider  diazepam (VALIUM) 5 MG tablet Take 1 tablet (5 mg total) by mouth every 8 (eight) hours as needed for anxiety (muscle spasm). 11/06/16   Tanna Furry, MD  meclizine (ANTIVERT) 25 MG tablet Take 1 tablet (25 mg total) by mouth 3 (three) times daily as needed for dizziness. 11/03/16   Malvin Johns, MD  nitrofurantoin, macrocrystal-monohydrate, (MACROBID) 100 MG capsule Take 1 capsule (100 mg total) by mouth 2 (two) times daily. X 7 days 11/03/16   Malvin Johns, MD  ondansetron (ZOFRAN ODT) 4 MG disintegrating tablet Take 1 tablet (4 mg total) by mouth every 8 (eight) hours as needed for nausea. 11/06/16   Tanna Furry, MD    Family History Family History  Problem Relation Age of Onset  . Diabetes Mother   . Hypertension Mother   . Thyroid disease Mother     Social History Social History   Tobacco Use  . Smoking status: Never Smoker  . Smokeless tobacco: Never Used  Substance Use Topics  . Alcohol use: No  . Drug use: No     Allergies   Bactrim [sulfamethoxazole-trimethoprim] and Penicillins   Review of Systems Review of Systems All other systems negative except as documented in the HPI. All pertinent positives  and negatives as reviewed in the HPI.  Physical Exam Updated Vital Signs BP (!) 170/91   Pulse 64   Temp 98.7 F (37.1 C) (Oral)   Resp 20   LMP 07/29/2017   SpO2 99%   Physical Exam  Constitutional: She is oriented to person, place, and time. She appears well-developed and well-nourished. No distress.  HENT:  Head: Normocephalic and atraumatic.  Mouth/Throat: Oropharynx is clear and moist.  Eyes: Pupils are equal, round, and reactive to light.  Neck: Normal range of motion. Neck supple.  Cardiovascular: Normal rate, regular rhythm and normal heart sounds. Exam reveals no gallop and no friction rub.  No murmur heard. Pulmonary/Chest: Effort normal and breath sounds normal. No respiratory distress. She has no decreased breath sounds. She  has no wheezes. She has no rhonchi. She has no rales.  Abdominal: Soft. Bowel sounds are normal. She exhibits no distension. There is no tenderness.  Neurological: She is alert and oriented to person, place, and time. She exhibits normal muscle tone. Coordination normal.  Skin: Skin is warm and dry. Capillary refill takes less than 2 seconds. No rash noted. No erythema.  Psychiatric: She has a normal mood and affect. Her behavior is normal.  Nursing note and vitals reviewed.    ED Treatments / Results  Labs (all labs ordered are listed, but only abnormal results are displayed) Labs Reviewed - No data to display  EKG None  Radiology Dg Chest 2 View  Result Date: 08/14/2017 CLINICAL DATA:  47 y/o  F; shortness of breath. EXAM: CHEST - 2 VIEW COMPARISON:  08/28/2015 chest radiograph FINDINGS: Stable cardiac silhouette given projection and technique. Mild bronchitic changes. No consolidation, effusion, or pneumothorax. Right upper quadrant surgical clips, presumably cholecystectomy. Bones are unremarkable. IMPRESSION: Mild bronchitic changes. No consolidation. Stable cardiac silhouette. Electronically Signed   By: Kristine Garbe M.D.   On: 08/14/2017 05:49    Procedures Procedures (including critical care time)  Medications Ordered in ED Medications - No data to display   Initial Impression / Assessment and Plan / ED Course  I have reviewed the triage vital signs and the nursing notes.  Pertinent labs & imaging results that were available during my care of the patient were reviewed by me and considered in my medical decision making (see chart for details).    Patient will be treated for bronchitis.  Told return here as needed.  Patient is been showing no signs of respiratory distress here in the emergency department.  She is maintained her oxygen saturations.  She has no wheezing noted on exam and reexamination.  Patient is advised to follow-up with her primary care  doctor.  Final Clinical Impressions(s) / ED Diagnoses   Final diagnoses:  None    ED Discharge Orders    None       Dalia Heading, PA-C 08/15/17 1556    Carmin Muskrat, MD 08/15/17 1609

## 2017-08-14 NOTE — ED Triage Notes (Signed)
Pt reports that since yesterday she has been SOB, dry cough, denies fevers, states feels the same as the last time when she has pneumonia

## 2018-10-30 ENCOUNTER — Emergency Department (HOSPITAL_COMMUNITY)
Admission: EM | Admit: 2018-10-30 | Discharge: 2018-10-30 | Disposition: A | Discharge status: Home / Self Care | Payer: BC Managed Care – PPO | Attending: Emergency Medicine | Admitting: Emergency Medicine

## 2018-10-30 ENCOUNTER — Other Ambulatory Visit: Payer: Self-pay

## 2018-10-30 ENCOUNTER — Emergency Department (HOSPITAL_COMMUNITY): Payer: BC Managed Care – PPO

## 2018-10-30 DIAGNOSIS — I10 Essential (primary) hypertension: Secondary | ICD-10-CM | POA: Insufficient documentation

## 2018-10-30 DIAGNOSIS — N12 Tubulo-interstitial nephritis, not specified as acute or chronic: Secondary | ICD-10-CM

## 2018-10-30 DIAGNOSIS — J45909 Unspecified asthma, uncomplicated: Secondary | ICD-10-CM | POA: Insufficient documentation

## 2018-10-30 DIAGNOSIS — E119 Type 2 diabetes mellitus without complications: Secondary | ICD-10-CM | POA: Insufficient documentation

## 2018-10-30 DIAGNOSIS — E039 Hypothyroidism, unspecified: Secondary | ICD-10-CM | POA: Insufficient documentation

## 2018-10-30 LAB — I-STAT BETA HCG BLOOD, ED (MC, WL, AP ONLY): I-stat hCG, quantitative: <5 m[IU]/mL (ref <5)

## 2018-10-30 LAB — URINALYSIS, ROUTINE W REFLEX MICROSCOPIC
Bilirubin Urine: NEGATIVE
Glucose, UA: NEGATIVE mg/dL
Ketones, ur: NEGATIVE mg/dL
Nitrite: NEGATIVE
Protein, ur: 30 mg/dL — AB
RBC / HPF: >50 RBC/hpf — ABNORMAL HIGH (ref 0–5)
Specific Gravity, Urine: 1.006 (ref 1.005–1.030)
WBC, UA: >50 WBC/hpf — ABNORMAL HIGH (ref 0–5)
pH: 5 (ref 5.0–8.0)

## 2018-10-30 LAB — BASIC METABOLIC PANEL
Anion gap: 13 (ref 5–15)
BUN: 13 mg/dL (ref 6–20)
CO2: 20 mmol/L — ABNORMAL LOW (ref 22–32)
Calcium: 9.2 mg/dL (ref 8.9–10.3)
Chloride: 104 mmol/L (ref 98–111)
Creatinine, Ser: 0.71 mg/dL (ref 0.44–1.00)
GFR calc Af Amer: >60 mL/min (ref >60)
GFR calc non Af Amer: >60 mL/min (ref >60)
Glucose, Bld: 184 mg/dL — ABNORMAL HIGH (ref 70–99)
Potassium: 4 mmol/L (ref 3.5–5.1)
Sodium: 137 mmol/L (ref 135–145)

## 2018-10-30 LAB — CBC WITH DIFFERENTIAL/PLATELET
Abs Immature Granulocytes: 0.03 10*3/uL (ref 0.00–0.07)
Basophils Absolute: 0 10*3/uL (ref 0.0–0.1)
Basophils Relative: 0 %
Eosinophils Absolute: 0.1 10*3/uL (ref 0.0–0.5)
Eosinophils Relative: 1 %
HCT: 36.1 % (ref 36.0–46.0)
Hemoglobin: 11.1 g/dL — ABNORMAL LOW (ref 12.0–15.0)
Immature Granulocytes: 0 %
Lymphocytes Relative: 7 %
Lymphs Abs: 0.6 10*3/uL — ABNORMAL LOW (ref 0.7–4.0)
MCH: 26.9 pg (ref 26.0–34.0)
MCHC: 30.7 g/dL (ref 30.0–36.0)
MCV: 87.4 fL (ref 80.0–100.0)
Monocytes Absolute: 0.2 10*3/uL (ref 0.1–1.0)
Monocytes Relative: 2 %
Neutro Abs: 7.6 10*3/uL (ref 1.7–7.7)
Neutrophils Relative %: 90 %
Platelets: 376 10*3/uL (ref 150–400)
RBC: 4.13 MIL/uL (ref 3.87–5.11)
RDW: 15 % (ref 11.5–15.5)
WBC: 8.5 10*3/uL (ref 4.0–10.5)
nRBC: 0 % (ref 0.0–0.2)

## 2018-10-30 MED ORDER — CEPHALEXIN 500 MG PO CAPS: 500.0000 mg | ORAL_CAPSULE | Freq: Four times a day (QID) | ORAL | 0 refills | Status: AC

## 2018-10-30 MED ORDER — ACETAMINOPHEN 325 MG PO TABS
650.0000 mg | ORAL_TABLET | Freq: Once | ORAL | Status: AC
  Administered 2018-10-30: 08:00:00 650 mg via ORAL
  Filled 2018-10-30: qty 2

## 2018-10-30 MED ORDER — SODIUM CHLORIDE 0.9 % IV BOLUS
1000.0000 mL | Freq: Once | INTRAVENOUS | Status: AC
  Administered 2018-10-30: 08:00:00 1000 mL via INTRAVENOUS

## 2018-10-30 MED ORDER — SODIUM CHLORIDE 0.9 % IV SOLN
1.0000 g | Freq: Once | INTRAVENOUS | Status: AC
  Administered 2018-10-30: 09:00:00 1 g via INTRAVENOUS
  Filled 2018-10-30: qty 10

## 2018-10-30 MED ORDER — MORPHINE SULFATE (PF) 4 MG/ML IV SOLN
4.0000 mg | Freq: Once | INTRAVENOUS | Status: AC
  Administered 2018-10-30: 09:00:00 4 mg via INTRAVENOUS
  Filled 2018-10-30: qty 1

## 2018-10-30 NOTE — ED Triage Notes (Signed)
Per EMS - Pt coming from home with CC of chills and not being able to get warm. Pt states she has a hx of bladder infections but has not had one for approx 4-6 months.    161 84 98 HR 20 R CBG 176 98.9 99% RA

## 2018-10-30 NOTE — Discharge Instructions (Signed)
Please complete the entire course of antibiotics regardless of symptom improvement to prevent worsening or recurrence of your infection. We will call you if you need to be on a different antibiotic than what was prescribed based on her culture results. Return to the ED if you start to have worsening symptoms, worsening back pain, unable to tolerate anything by mouth, shortness of breath or chest pain.

## 2018-10-30 NOTE — ED Provider Notes (Signed)
[facility] DEPT Provider Note   CSN: [id number] Arrival date & time: 10/30/18  [id number]     History   Chief Complaint Chief Complaint  Patient presents with   Chills    HPI [name] is a 48 y.o. female with a past medical history of diabetes, asthma, who presents to ED for evaluation of chills.  States that she woke up this morning with generalized chills.  She is concerned that she has a UTI.  She has been having urinary frequency but denies any dysuria.  States that usually her symptoms will progress to dysuria.  She gets UTIs 3-4 times a year.  She does not take any medications at home to help with pain.  Also reports left-sided flank pain radiating to her back.  Denies history of kidney stones.  Denies any sick contacts with similar symptoms, cough, shortness of breath, chest pain, nausea, vomiting, diarrhea, known COVID-19 exposures.     HPI  Past Medical History:  Diagnosis Date   Asthma    Chronic back pain    Chronic daily headache    Chronic neck pain    Diabetes mellitus without complication ([place])    Sciatica    Thyroid disease     Patient Active Problem List   Diagnosis Date Noted   CAP (community acquired pneumonia) 08/06/2015   Hypertension [id number]   Metabolic syndrome [id number]   Obesity 02/24/2015   Metrorrhagia 02/24/2015   Iron deficiency anemia 02/24/2015   Glucose intolerance (impaired glucose tolerance) 02/21/2015   Hypothyroidism 11/24/2014    Past Surgical History:  Procedure Laterality Date   CESAREAN SECTION     CHOLECYSTECTOMY     TUBAL LIGATION       OB History    Gravida  6   Para  4   Term  3   Preterm  1   AB  2   Living  3     SAB  2   TAB      Ectopic      Multiple      Live Births  4            Home Medications    Prior to Admission medications   Medication Sig Start Date End Date Taking? Authorizing Provider  ibuprofen (ADVIL,MOTRIN) 200 MG tablet  Take 800 mg by mouth every 6 (six) hours as needed for moderate pain.   Yes [provider]  cephALEXin (KEFLEX) 500 MG capsule Take 1 capsule (500 mg total) by mouth 4 (four) times daily for 10 days. 10/30/18 11/09/18  Aharon Carriere, PA-C  diazepam (VALIUM) 5 MG tablet Take 1 tablet (5 mg total) by mouth every 8 (eight) hours as needed for anxiety (muscle spasm). Patient not taking: Reported on 08/14/2017 11/06/16   Tanna Furry, MD  Guaifenesin 1200 MG TB12 Take 1 tablet (1,200 mg total) by mouth 2 (two) times daily. Patient not taking: Reported on 10/30/2018 08/14/17   Dalia Heading, PA-C  meclizine (ANTIVERT) 25 MG tablet Take 1 tablet (25 mg total) by mouth 3 (three) times daily as needed for dizziness. Patient not taking: Reported on 08/14/2017 11/03/16   Malvin Johns, MD  ondansetron (ZOFRAN ODT) 4 MG disintegrating tablet Take 1 tablet (4 mg total) by mouth every 8 (eight) hours as needed for nausea. Patient not taking: Reported on 08/14/2017 11/06/16   Tanna Furry, MD  predniSONE (DELTASONE) 50 MG tablet Take 1 tablet (50 mg total) by mouth daily. Patient not taking: Reported  on 10/30/2018 08/14/17   Dalia Heading, PA-C    Family History Family History  Problem Relation Age of Onset   Diabetes Mother    Hypertension Mother    Thyroid disease Mother     Social History Social History   Tobacco Use   Smoking status: Never Smoker   Smokeless tobacco: Never Used  Substance Use Topics   Alcohol use: No   Drug use: No     Allergies   Bactrim [sulfamethoxazole-trimethoprim] and Penicillins   Review of Systems Review of Systems  Constitutional: Positive for chills. Negative for appetite change and fever.  HENT: Negative for ear pain, rhinorrhea, sneezing and sore throat.   Eyes: Negative for photophobia and visual disturbance.  Respiratory: Negative for cough, chest tightness, shortness of breath and wheezing.   Cardiovascular: Negative for chest pain  and palpitations.  Gastrointestinal: Positive for abdominal pain. Negative for blood in stool, constipation, diarrhea, nausea and vomiting.  Genitourinary: Positive for frequency. Negative for dysuria, hematuria and urgency.  Musculoskeletal: Negative for myalgias.  Skin: Negative for rash.  Neurological: Negative for dizziness, weakness and light-headedness.     Physical Exam Updated Vital Signs BP 133/72    Pulse 99    Temp (!) 101.3 F (38.5 C) (Oral)    Resp (!) 26    Ht 5\' 2"  (1.575 m)    Wt 95.3 kg    SpO2 97%    BMI 38.41 kg/m   Physical Exam Vitals signs and nursing note reviewed.  Constitutional:      General: She is not in acute distress.    Appearance: She is well-developed.  HENT:     Head: Normocephalic and atraumatic.     Nose: Nose normal.  Eyes:     General: No scleral icterus.       Right eye: No discharge.        Left eye: No discharge.     Conjunctiva/sclera: Conjunctivae normal.  Neck:     Musculoskeletal: Normal range of motion and neck supple.  Cardiovascular:     Rate and Rhythm: Regular rhythm. Tachycardia present.     Heart sounds: Normal heart sounds. No murmur. No friction rub. No gallop.   Pulmonary:     Effort: Pulmonary effort is normal. No respiratory distress.     Breath sounds: Normal breath sounds.  Abdominal:     General: Bowel sounds are normal. There is no distension.     Palpations: Abdomen is soft.     Tenderness: There is abdominal tenderness (Left lower quadrant and left flank). There is no guarding.  Musculoskeletal: Normal range of motion.  Skin:    General: Skin is warm and dry.     Findings: No rash.  Neurological:     Mental Status: She is alert.     Motor: No abnormal muscle tone.     Coordination: Coordination normal.      ED Treatments / Results  Labs (all labs ordered are listed, but only abnormal results are displayed) Labs Reviewed  BASIC METABOLIC PANEL - Abnormal; Notable for the following components:       Result Value   CO2 20 (*)    Glucose, Bld 184 (*)    All other components within normal limits  CBC WITH DIFFERENTIAL/PLATELET - Abnormal; Notable for the following components:   Hemoglobin 11.1 (*)    Lymphs Abs 0.6 (*)    All other components within normal limits  URINALYSIS, ROUTINE W REFLEX MICROSCOPIC - Abnormal; Notable for  the following components:   Color, Urine STRAW (*)    APPearance CLOUDY (*)    Hgb urine dipstick LARGE (*)    Protein, ur 30 (*)    Leukocytes,Ua LARGE (*)    RBC / HPF >50 (*)    WBC, UA >50 (*)    Bacteria, UA RARE (*)    All other components within normal limits  URINE CULTURE  I-STAT BETA HCG BLOOD, ED ([facility], [facility], [facility] ONLY)  CBG MONITORING, ED    EKG None  Radiology Dg Chest Portable 1 View  Result Date: 10/30/2018 CLINICAL DATA:  Fever, chills. EXAM: PORTABLE CHEST 1 VIEW COMPARISON:  Radiographs of August 14, 2017. FINDINGS: The heart size and mediastinal contours are within normal limits. Both lungs are clear. The visualized skeletal structures are unremarkable. IMPRESSION: No active disease. Electronically Signed   By: Marijo Conception M.D.   On: 10/30/2018 08:13   Ct Renal Stone Study  Result Date: 10/30/2018 CLINICAL DATA:  Flank pain.  Evaluate for stone disease EXAM: CT ABDOMEN AND PELVIS WITHOUT CONTRAST TECHNIQUE: Multidetector CT imaging of the abdomen and pelvis was performed following the standard protocol without IV contrast. COMPARISON:  12/25/2013 FINDINGS: Lower chest: No acute abnormality. Hepatobiliary: There is diffuse hepatic steatosis with hypertrophy of the lateral segment and caudate lobe of liver. Previous cholecystectomy. No biliary ductal dilatation. Pancreas: Unremarkable. No pancreatic ductal dilatation or surrounding inflammatory changes. Spleen: Normal in size without focal abnormality. Adrenals/Urinary Tract: Normal appearance of the adrenal glands. 4 mm stone within the inferior pole of left kidney, image 45/2. No right renal  calculi. No hydronephrosis mild bilateral Peri ureteral soft tissue stranding is identified. There is mild hydroureter also noted. No ureteral calculi identified bilaterally. Bladder unremarkable. Stomach/Bowel: Stomach is within normal limits. Appendix appears normal. No evidence of bowel wall thickening, distention, or inflammatory changes. Vascular/Lymphatic: No significant vascular findings are present. No enlarged abdominal or pelvic lymph nodes. No abdominopelvic adenopathy identified. Reproductive: Uterus and bilateral adnexa are unremarkable. Other: No free fluid or fluid collections. Musculoskeletal: No acute or significant osseous findings. IMPRESSION: 1. There is bilateral Peri ureteral soft tissue stranding compatible with inflammation. Correlation for any clinical signs or symptoms of upper urinary tract infection. 2. Nonobstructing left renal calculus measuring 4 mm. 3. Hepatic steatosis. Electronically Signed   By: Kerby Moors M.D.   On: 10/30/2018 09:53    Procedures Procedures (including critical care time)  Medications Ordered in ED Medications  sodium chloride 0.9 % bolus 1,000 mL (0 mLs Intravenous Stopped 10/30/18 0907)  acetaminophen (TYLENOL) tablet 650 mg (650 mg Oral Given 10/30/18 0758)  morphine 4 MG/ML injection 4 mg (4 mg Intravenous Given 10/30/18 0915)  cefTRIAXone (ROCEPHIN) 1 g in sodium chloride 0.9 % 100 mL IVPB (0 g Intravenous Stopped 10/30/18 1007)     Initial Impression / Assessment and Plan / ED Course  I have reviewed the triage vital signs and the nursing notes.  Pertinent labs & imaging results that were available during my care of the patient were reviewed by me and considered in my medical decision making (see chart for details).  Clinical Course as of Oct 30 1023  Fri Oct 30, 2018  0930 Leukocytes,Ua(!): LARGE [HK]  0930 WBC, UA(!): >50 [HK]  1007 Patient resting comfortably on repeat.  Heart rate improved to 98.  Temp improved to 98.4.    [HK]     Clinical Course User Index [HK] Delia Heady, PA-C  Megham Ehresmann was evaluated in Emergency Department on 10/30/18  for the symptoms described in the history of present illness. He/she was evaluated in the context of the global COVID-19 pandemic, which necessitated consideration that the patient might be at risk for infection with the SARS-CoV-2 virus that causes COVID-19. Institutional protocols and algorithms that pertain to the evaluation of patients at risk for COVID-19 are in a state of rapid change based on information released by regulatory bodies including the CDC and federal and state organizations. These policies and algorithms were followed during the patient's care in the ED.  48 year old female presents to ED for chills, urinary frequency and flank pain since this morning.  Has a history of UTIs and states that this feels similar to an early UTI.  No sick contacts with similar symptoms.  Denies any chest pain, shortness of breath, cough.  On my exam patient is overall well-appearing.  Initially tachycardic, febrile.  Normal blood pressure noted.  Left-sided flank pain left lower quadrant pain.  Lab work significant for normal WBC count, BMP.  Urinalysis with large leukocytes, pyuria, hematuria.  CT renal stone study shows findings consistent with pyelonephritis.  Patient was given fluids, IV Rocephin with improvement in her heart rate.  Fever has improved with Tylenol.  Patient comfortable with discharge home with pyelonephritis treatment.  She was given cephalosporins here for she is able to tolerate despite her penicillin allergy.  Will culture urine, have her follow-up with PCP and return for worsening symptoms.  Patient is hemodynamically stable, in NAD, and able to ambulate in the ED. Evaluation does not show pathology that would require ongoing emergent intervention or inpatient treatment. I explained the diagnosis to the patient. Pain has been managed and has no complaints prior  to discharge. Patient is comfortable with above plan and is stable for discharge at this time. All questions were answered prior to disposition. Strict return precautions for returning to the ED were discussed. Encouraged follow up with PCP.   An After Visit Summary was printed and given to the patient.   Portions of this note were generated with [employer]. Dictation errors may occur despite best attempts at proofreading.  Final Clinical Impressions(s) / ED Diagnoses   Final diagnoses:  Pyelonephritis    ED Discharge Orders         Ordered    cephALEXin (KEFLEX) 500 MG capsule  4 times daily     10/30/18 [address] PA-C 10/30/18 1436    Charlesetta Shanks, MD 11/02/18 0725

## 2018-11-01 LAB — URINE CULTURE: Culture: >=100000 — AB

## 2018-11-02 ENCOUNTER — Telehealth: Payer: Self-pay | Admitting: *Deleted

## 2018-11-02 NOTE — Telephone Encounter (Signed)
Post ED Visit - Positive Culture Follow-up  Culture report reviewed by antimicrobial stewardship pharmacist: [place] Team []  Elenor Quinones, Pharm.D. []  Heide Guile, Pharm.D., BCPS AQ-ID []  Parks Neptune, Pharm.D., BCPS []  Alycia Rossetti, Pharm.D., BCPS []  [place], Pharm.D., BCPS, AAHIVP []  Legrand Como, Pharm.D., BCPS, AAHIVP []  Salome Arnt, PharmD, BCPS []  Johnnette Gourd, PharmD, BCPS []  Hughes Better, PharmD, BCPS []  Leeroy Cha, PharmD []  Laqueta Linden, PharmD, BCPS []  Albertina Parr, PharmD  [place] Team []  Leodis Sias, PharmD []  Lindell Spar, PharmD []  Royetta Asal, PharmD []  Graylin Shiver, Rph []  Rema Fendt) Glennon Mac, PharmD []  Arlyn Dunning, PharmD []  Netta Cedars, PharmD []  Dia Sitter, PharmD []  Leone Haven, PharmD []  Gretta Arab, PharmD []  Theodis Shove, PharmD []  Peggyann Juba, PharmD []  Reuel Boom, PharmD   Positive urine culture, reviewed by Leodis Sias, PharmD Treated with Cephalexin, organism sensitive to the same and no further patient follow-up is required at this time.  Harlon Flor [facility] 11/02/2018, 3:37 PM

## 2018-11-17 ENCOUNTER — Emergency Department (HOSPITAL_COMMUNITY): Payer: Self-pay

## 2018-11-17 ENCOUNTER — Encounter (HOSPITAL_COMMUNITY): Payer: Self-pay | Admitting: Emergency Medicine

## 2018-11-17 DIAGNOSIS — I10 Essential (primary) hypertension: Secondary | ICD-10-CM | POA: Insufficient documentation

## 2018-11-17 DIAGNOSIS — E119 Type 2 diabetes mellitus without complications: Secondary | ICD-10-CM | POA: Insufficient documentation

## 2018-11-17 DIAGNOSIS — R0789 Other chest pain: Secondary | ICD-10-CM | POA: Insufficient documentation

## 2018-11-17 DIAGNOSIS — J45909 Unspecified asthma, uncomplicated: Secondary | ICD-10-CM | POA: Insufficient documentation

## 2018-11-17 DIAGNOSIS — E039 Hypothyroidism, unspecified: Secondary | ICD-10-CM | POA: Insufficient documentation

## 2018-11-17 MED ORDER — SODIUM CHLORIDE 0.9% FLUSH: 3.0000 mL | Freq: Once | INTRAVENOUS | Status: DC

## 2018-11-17 NOTE — ED Triage Notes (Signed)
Patient here from home with complaints of right sided chest pain since yesterday radiating around to upper back.. Hypertension no history.

## 2018-11-17 NOTE — ED Notes (Signed)
Pt requests blood work to be done with start of IV

## 2018-11-18 ENCOUNTER — Emergency Department (HOSPITAL_COMMUNITY)
Admission: EM | Admit: 2018-11-18 | Discharge: 2018-11-18 | Disposition: A | Discharge status: Home / Self Care | Payer: Self-pay | Attending: Emergency Medicine | Admitting: Emergency Medicine

## 2018-11-18 ENCOUNTER — Encounter (HOSPITAL_COMMUNITY): Payer: Self-pay

## 2018-11-18 ENCOUNTER — Emergency Department (HOSPITAL_COMMUNITY): Payer: Self-pay

## 2018-11-18 DIAGNOSIS — R0789 Other chest pain: Secondary | ICD-10-CM

## 2018-11-18 DIAGNOSIS — I10 Essential (primary) hypertension: Secondary | ICD-10-CM

## 2018-11-18 LAB — CBC
HCT: 37 % (ref 36.0–46.0)
Hemoglobin: 11.3 g/dL — ABNORMAL LOW (ref 12.0–15.0)
MCH: 26.8 pg (ref 26.0–34.0)
MCHC: 30.5 g/dL (ref 30.0–36.0)
MCV: 87.9 fL (ref 80.0–100.0)
Platelets: 424 10*3/uL — ABNORMAL HIGH (ref 150–400)
RBC: 4.21 MIL/uL (ref 3.87–5.11)
RDW: 14.8 % (ref 11.5–15.5)
WBC: 9 10*3/uL (ref 4.0–10.5)
nRBC: 0 % (ref 0.0–0.2)

## 2018-11-18 LAB — BASIC METABOLIC PANEL
Anion gap: 11 (ref 5–15)
BUN: 16 mg/dL (ref 6–20)
CO2: 22 mmol/L (ref 22–32)
Calcium: 8.9 mg/dL (ref 8.9–10.3)
Chloride: 99 mmol/L (ref 98–111)
Creatinine, Ser: 0.81 mg/dL (ref 0.44–1.00)
GFR calc Af Amer: >60 mL/min (ref >60)
GFR calc non Af Amer: >60 mL/min (ref >60)
Glucose, Bld: 155 mg/dL — ABNORMAL HIGH (ref 70–99)
Potassium: 3.8 mmol/L (ref 3.5–5.1)
Sodium: 132 mmol/L — ABNORMAL LOW (ref 135–145)

## 2018-11-18 LAB — TROPONIN I (HIGH SENSITIVITY): Troponin I (High Sensitivity): 3 ng/L (ref <18)

## 2018-11-18 LAB — I-STAT BETA HCG BLOOD, ED (NOT ORDERABLE): I-stat hCG, quantitative: <5 m[IU]/mL (ref <5)

## 2018-11-18 LAB — D-DIMER, QUANTITATIVE: D-Dimer, Quant: 0.53 ug/mL-FEU — ABNORMAL HIGH (ref 0.00–0.50)

## 2018-11-18 MED ORDER — SODIUM CHLORIDE (PF) 0.9 % IJ SOLN
INTRAMUSCULAR | Status: AC
  Filled 2018-11-18: qty 50

## 2018-11-18 MED ORDER — IOHEXOL 350 MG/ML SOLN
100.0000 mL | Freq: Once | INTRAVENOUS | Status: AC | PRN
  Administered 2018-11-18: 100 mL via INTRAVENOUS

## 2018-11-18 MED ORDER — AMLODIPINE BESYLATE 5 MG PO TABS: 5.0000 mg | ORAL_TABLET | Freq: Every day | ORAL | 0 refills | Status: DC

## 2018-11-18 NOTE — Progress Notes (Signed)
Reviewed chart for PIV needs. PIV obtained by ED RN.

## 2018-11-18 NOTE — ED Provider Notes (Signed)
[facility] DEPT Provider Note   CSN: [id number] Arrival date & time: 11/17/18  1921     History   Chief Complaint Chief Complaint  Patient presents with  . Chest Pain    HPI [name] is a 48 y.o. female.     HPI  This is a 48 year old female with a history of asthma, diabetes, hypertension who presents with chest pain.  Patient reports 2-day history of right-sided chest pain that is nonradiating.  She states that it is worse with certain movements and gets better when she holds pressure.  There is some increased pain with breathing.  No recent fevers or cough.  Patient currently rates her pain at 8 out of 10.  She took some Advil with no relief.  Pain is not exertional in nature.  Patient does have a history of hypertension but is not currently on any medications.  She was notably hypertensive in triage.  Past Medical History:  Diagnosis Date  . Asthma   . Chronic back pain   . Chronic daily headache   . Chronic neck pain   . Diabetes mellitus without complication ([place])   . Sciatica   . Thyroid disease     Patient Active Problem List   Diagnosis Date Noted  . CAP (community acquired pneumonia) 08/06/2015  . Hypertension 08/06/2015  . Metabolic syndrome [id number]  . Obesity 02/24/2015  . Metrorrhagia 02/24/2015  . Iron deficiency anemia 02/24/2015  . Glucose intolerance (impaired glucose tolerance) 02/21/2015  . Hypothyroidism 11/24/2014    Past Surgical History:  Procedure Laterality Date  . CESAREAN SECTION    . CHOLECYSTECTOMY    . TUBAL LIGATION       OB History    Gravida  6   Para  4   Term  3   Preterm  1   AB  2   Living  3     SAB  2   TAB      Ectopic      Multiple      Live Births  4            Home Medications    Prior to Admission medications   Medication Sig Start Date End Date Taking? Authorizing Provider  ibuprofen (ADVIL,MOTRIN) 200 MG tablet Take 800 mg by mouth every 6 (six)  hours as needed for moderate pain.   Yes [provider]  diazepam (VALIUM) 5 MG tablet Take 1 tablet (5 mg total) by mouth every 8 (eight) hours as needed for anxiety (muscle spasm). Patient not taking: Reported on 08/14/2017 11/06/16   Tanna Furry, MD  Guaifenesin 1200 MG TB12 Take 1 tablet (1,200 mg total) by mouth 2 (two) times daily. Patient not taking: Reported on 10/30/2018 08/14/17   Dalia Heading, PA-C  meclizine (ANTIVERT) 25 MG tablet Take 1 tablet (25 mg total) by mouth 3 (three) times daily as needed for dizziness. Patient not taking: Reported on 08/14/2017 11/03/16   Malvin Johns, MD  ondansetron (ZOFRAN ODT) 4 MG disintegrating tablet Take 1 tablet (4 mg total) by mouth every 8 (eight) hours as needed for nausea. Patient not taking: Reported on 08/14/2017 11/06/16   Tanna Furry, MD  predniSONE (DELTASONE) 50 MG tablet Take 1 tablet (50 mg total) by mouth daily. Patient not taking: Reported on 10/30/2018 08/14/17   Dalia Heading, PA-C    Family History Family History  Problem Relation Age of Onset  . Diabetes Mother   . Hypertension Mother   .  Thyroid disease Mother     Social History Social History   Tobacco Use  . Smoking status: Never Smoker  . Smokeless tobacco: Never Used  Substance Use Topics  . Alcohol use: No  . Drug use: No     Allergies   Bactrim [sulfamethoxazole-trimethoprim] and Penicillins   Review of Systems Review of Systems  Constitutional: Negative for fever.  Respiratory: Negative for shortness of breath.   Cardiovascular: Positive for chest pain. Negative for leg swelling.  Gastrointestinal: Negative for abdominal pain, nausea and vomiting.  Genitourinary: Negative for dysuria.  All other systems reviewed and are negative.    Physical Exam Updated Vital Signs BP (!) 182/105   Pulse 79   Temp 99.3 F (37.4 C) (Oral)   Resp 20   Ht 1.575 m (5\' 2" )   Wt 95.3 kg   LMP 11/03/2018   SpO2 98%   BMI 38.41 kg/m    Physical Exam Vitals signs and nursing note reviewed.  Constitutional:      Appearance: She is well-developed.     Comments: Obese  HENT:     Head: Normocephalic and atraumatic.  Eyes:     Pupils: Pupils are equal, round, and reactive to light.  Neck:     Musculoskeletal: Neck supple.  Cardiovascular:     Rate and Rhythm: Normal rate and regular rhythm.     Heart sounds: Normal heart sounds.  Pulmonary:     Effort: Pulmonary effort is normal. No respiratory distress.     Breath sounds: No wheezing.  Chest:     Chest wall: No tenderness or crepitus.  Abdominal:     General: Bowel sounds are normal.     Palpations: Abdomen is soft.  Musculoskeletal:     Right lower leg: She exhibits no tenderness. Edema present.     Left lower leg: She exhibits no tenderness. Edema present.  Skin:    General: Skin is warm and dry.  Neurological:     Mental Status: She is alert and oriented to person, place, and time.  Psychiatric:        Mood and Affect: Mood normal.      ED Treatments / Results  Labs (all labs ordered are listed, but only abnormal results are displayed) Labs Reviewed  BASIC METABOLIC PANEL - Abnormal; Notable for the following components:      Result Value   Sodium 132 (*)    Glucose, Bld 155 (*)    All other components within normal limits  CBC - Abnormal; Notable for the following components:   Hemoglobin 11.3 (*)    Platelets 424 (*)    All other components within normal limits  D-DIMER, QUANTITATIVE (NOT AT [facility]) - Abnormal; Notable for the following components:   D-Dimer, Quant 0.53 (*)    All other components within normal limits  I-STAT BETA HCG BLOOD, ED ([facility], [facility], [facility] ONLY)  I-STAT BETA HCG BLOOD, ED (NOT ORDERABLE)  TROPONIN I (HIGH SENSITIVITY)    EKG EKG Interpretation  Date/Time:  Tuesday November 17 2018 19:32:50 EDT Ventricular Rate:  102 PR Interval:    QRS Duration: 77 QT Interval:  323 QTC Calculation: 421 R Axis:   5 Text  Interpretation:  Sinus tachycardia since last tracing no significant change Confirmed by Daleen Bo [id number]) on 11/17/2018 7:38:04 PM Also confirmed by Thayer Jew [phone])  on 11/18/2018 1:50:53 AM   Radiology Dg Chest 2 View  Result Date: 11/17/2018 CLINICAL DATA:  Chest pain EXAM: CHEST - 2  VIEW COMPARISON:  Radiograph 10/30/2018 FINDINGS: No consolidation, features of edema, pneumothorax, or effusion. Pulmonary vascularity is normally distributed. The cardiomediastinal contours are unremarkable. No acute osseous or soft tissue abnormality. Multilevel degenerative changes are present in the imaged portions of the spine. IMPRESSION: No acute cardiopulmonary abnormality. Electronically Signed   By: Lovena Le M.D.   On: 11/17/2018 20:05    Procedures Procedures (including critical care time)  Angiocath insertion Performed by: Merryl Hacker  Consent: Verbal consent obtained. Risks and benefits: risks, benefits and alternatives were discussed Time out: Immediately prior to procedure a "time out" was called to verify the correct patient, procedure, equipment, support staff and site/side marked as required.  Preparation: Patient was prepped and draped in the usual sterile fashion.  Vein Location: right upper arm  Ultrasound Guided  Gauge: 20  Normal blood return and flush without difficulty Patient tolerance: Patient tolerated the procedure well with no immediate complications.     Medications Ordered in ED Medications  sodium chloride flush (NS) 0.9 % injection 3 mL (has no administration in time range)     Initial Impression / Assessment and Plan / ED Course  I have reviewed the triage vital signs and the nursing notes.  Pertinent labs & imaging results that were available during my care of the patient were reviewed by me and considered in my medical decision making (see chart for details).        Patient presents with chest pain.  Very atypical in nature.   She is overall nontoxic and vital signs are reassuring.  Chest x-ray without pneumothorax or pneumonia.  EKG without signs of ischemia or arrhythmia.  Initial troponin is negative.  She was initially tachycardic.  D-dimer was obtained.  D-dimer is slightly elevated at 0.53.  Will obtain a CT angio chest to rule out PE.  If this is negative, feel patient can safely be discharged home.  Final Clinical Impressions(s) / ED Diagnoses   Final diagnoses:  Atypical chest pain    ED Discharge Orders    None       Merryl Hacker, MD 11/18/18 [phone]

## 2018-11-18 NOTE — ED Provider Notes (Signed)
CT scan and lab results reviewed with patient.  No acute findings noted.  Low risk for ACS.   HTN noted.  Will start pt on norvasc, follow up with Durene Romans, MD 11/18/18 1003

## 2018-11-18 NOTE — ED Notes (Signed)
This RN and 1 other attempted IV and blood drawl unsuccessfully

## 2018-11-18 NOTE — Discharge Instructions (Addendum)
Follow up with your primary care doctor to review your blood pressure, return to the ED as needed for worsening symptoms

## 2019-03-06 ENCOUNTER — Encounter (HOSPITAL_COMMUNITY): Payer: Self-pay

## 2019-03-06 ENCOUNTER — Emergency Department (HOSPITAL_COMMUNITY)
Admission: EM | Admit: 2019-03-06 | Discharge: 2019-03-06 | Disposition: A | Discharge status: Home / Self Care | Payer: Self-pay | Attending: Emergency Medicine | Admitting: Emergency Medicine

## 2019-03-06 ENCOUNTER — Emergency Department (HOSPITAL_COMMUNITY): Payer: Self-pay

## 2019-03-06 ENCOUNTER — Other Ambulatory Visit: Payer: Self-pay

## 2019-03-06 DIAGNOSIS — J45909 Unspecified asthma, uncomplicated: Secondary | ICD-10-CM | POA: Insufficient documentation

## 2019-03-06 DIAGNOSIS — E039 Hypothyroidism, unspecified: Secondary | ICD-10-CM | POA: Insufficient documentation

## 2019-03-06 DIAGNOSIS — E119 Type 2 diabetes mellitus without complications: Secondary | ICD-10-CM | POA: Insufficient documentation

## 2019-03-06 DIAGNOSIS — R0789 Other chest pain: Secondary | ICD-10-CM | POA: Insufficient documentation

## 2019-03-06 DIAGNOSIS — I10 Essential (primary) hypertension: Secondary | ICD-10-CM | POA: Insufficient documentation

## 2019-03-06 DIAGNOSIS — Z79899 Other long term (current) drug therapy: Secondary | ICD-10-CM | POA: Insufficient documentation

## 2019-03-06 LAB — BASIC METABOLIC PANEL
Anion gap: 9 (ref 5–15)
BUN: 12 mg/dL (ref 6–20)
CO2: 24 mmol/L (ref 22–32)
Calcium: 8.7 mg/dL — ABNORMAL LOW (ref 8.9–10.3)
Chloride: 104 mmol/L (ref 98–111)
Creatinine, Ser: 0.7 mg/dL (ref 0.44–1.00)
GFR calc Af Amer: >60 mL/min (ref >60)
GFR calc non Af Amer: >60 mL/min (ref >60)
Glucose, Bld: 176 mg/dL — ABNORMAL HIGH (ref 70–99)
Potassium: 4 mmol/L (ref 3.5–5.1)
Sodium: 137 mmol/L (ref 135–145)

## 2019-03-06 LAB — I-STAT BETA HCG BLOOD, ED (MC, WL, AP ONLY): I-stat hCG, quantitative: <5 m[IU]/mL (ref <5)

## 2019-03-06 LAB — LIPASE, BLOOD: Lipase: 24 U/L (ref 11–51)

## 2019-03-06 LAB — CBC
HCT: 36.4 % (ref 36.0–46.0)
Hemoglobin: 11.5 g/dL — ABNORMAL LOW (ref 12.0–15.0)
MCH: 27.7 pg (ref 26.0–34.0)
MCHC: 31.6 g/dL (ref 30.0–36.0)
MCV: 87.7 fL (ref 80.0–100.0)
Platelets: 391 10*3/uL (ref 150–400)
RBC: 4.15 MIL/uL (ref 3.87–5.11)
RDW: 13.8 % (ref 11.5–15.5)
WBC: 9.3 10*3/uL (ref 4.0–10.5)
nRBC: 0 % (ref 0.0–0.2)

## 2019-03-06 MED ORDER — NAPROXEN 500 MG PO TABS: 500.0000 mg | ORAL_TABLET | Freq: Two times a day (BID) | ORAL | 0 refills | Status: DC | PRN

## 2019-03-06 MED ORDER — ACETAMINOPHEN 500 MG PO TABS: 500.0000 mg | ORAL_TABLET | Freq: Four times a day (QID) | ORAL | 0 refills | Status: AC | PRN

## 2019-03-06 NOTE — ED Triage Notes (Signed)
Per patient, she developed LUQ pain 5 days ago. Patient says pain became worse last night and is now 8/10. Patient says the pain is dull and radiates around her side toward her back. Denies injury or overexertion. Patient states she had this type of pain before and it was pneumonia.

## 2019-03-06 NOTE — ED Provider Notes (Signed)
[facility] DEPT Provider Note   CSN: [id number] Arrival date & time: 03/06/19  1408     History Chief Complaint  Patient presents with  . LUQ pain    [name] is a 49 y.o. female with no relevant PMH presents to the ED with a 5-day history of unprovoked left-sided anterior chest wall discomfort.  Patient reports that it is a dull, 4 out of 10 discomfort.  However, when she attempts to sit up out of a chair and flex at the waist, the pain becomes a sharp 8 out of 10 and radiates towards her back.  She reports that she has felt similar symptoms in the past and she had been diagnosed with a walking pneumonia.  She denies any recent notes, fevers or chills, coughing or sneezing, abdominal pain, nausea vomiting, change in bowel habits, or urinary symptoms.  She denies any specific inciting injury or event.  HPI     Past Medical History:  Diagnosis Date  . Asthma   . Chronic back pain   . Chronic daily headache   . Chronic neck pain   . Diabetes mellitus without complication ([place])   . Sciatica   . Thyroid disease     Patient Active Problem List   Diagnosis Date Noted  . CAP (community acquired pneumonia) 08/06/2015  . Hypertension 08/06/2015  . Metabolic syndrome [id number]  . Obesity 02/24/2015  . Metrorrhagia 02/24/2015  . Iron deficiency anemia 02/24/2015  . Glucose intolerance (impaired glucose tolerance) 02/21/2015  . Hypothyroidism 11/24/2014    Past Surgical History:  Procedure Laterality Date  . CESAREAN SECTION    . CHOLECYSTECTOMY    . TUBAL LIGATION       OB History    Gravida  6   Para  4   Term  3   Preterm  1   AB  2   Living  3     SAB  2   TAB      Ectopic      Multiple      Live Births  4           Family History  Problem Relation Age of Onset  . Diabetes Mother   . Hypertension Mother   . Thyroid disease Mother     Social History   Tobacco Use  . Smoking status: Never Smoker  .  Smokeless tobacco: Never Used  Substance Use Topics  . Alcohol use: No  . Drug use: No    Home Medications Prior to Admission medications   Medication Sig Start Date End Date Taking? Authorizing Provider  acetaminophen (TYLENOL) 500 MG tablet Take 1 tablet (500 mg total) by mouth every 6 (six) hours as needed for moderate pain. 03/06/19   Corena Herter, PA-C  amLODipine (NORVASC) 5 MG tablet Take 1 tablet (5 mg total) by mouth daily. 11/18/18   Dorie Rank, MD  diazepam (VALIUM) 5 MG tablet Take 1 tablet (5 mg total) by mouth every 8 (eight) hours as needed for anxiety (muscle spasm). Patient not taking: Reported on 08/14/2017 11/06/16   Tanna Furry, MD  Guaifenesin 1200 MG TB12 Take 1 tablet (1,200 mg total) by mouth 2 (two) times daily. Patient not taking: Reported on 10/30/2018 08/14/17   Dalia Heading, PA-C  ibuprofen (ADVIL,MOTRIN) 200 MG tablet Take 800 mg by mouth every 6 (six) hours as needed for moderate pain.    [provider]  meclizine (ANTIVERT) 25 MG tablet Take 1 tablet (  25 mg total) by mouth 3 (three) times daily as needed for dizziness. Patient not taking: Reported on 08/14/2017 11/03/16   Malvin Johns, MD  naproxen (NAPROSYN) 500 MG tablet Take 1 tablet (500 mg total) by mouth 2 (two) times daily between meals as needed for moderate pain. 03/06/19   Corena Herter, PA-C  ondansetron (ZOFRAN ODT) 4 MG disintegrating tablet Take 1 tablet (4 mg total) by mouth every 8 (eight) hours as needed for nausea. Patient not taking: Reported on 08/14/2017 11/06/16   Tanna Furry, MD  predniSONE (DELTASONE) 50 MG tablet Take 1 tablet (50 mg total) by mouth daily. Patient not taking: Reported on 10/30/2018 08/14/17   Dalia Heading, PA-C    Allergies    Bactrim [sulfamethoxazole-trimethoprim] and Penicillins  Review of Systems   Review of Systems  All other systems reviewed and are negative.   Physical Exam Updated Vital Signs BP 130/67 (BP Location: Right Arm)    Pulse 81   Temp 97.8 F (36.6 C) (Oral)   Resp 16   Ht 5\' 2"  (1.575 m)   Wt 93 kg   LMP 01/12/2019 Comment: Neg HCG Today  SpO2 97%   BMI 37.49 kg/m   Physical Exam Vitals and nursing note reviewed. Exam conducted with a chaperone present.  Constitutional:      Appearance: Normal appearance.  HENT:     Head: Normocephalic and atraumatic.  Eyes:     General: No scleral icterus.    Conjunctiva/sclera: Conjunctivae normal.  Cardiovascular:     Rate and Rhythm: Normal rate and regular rhythm.     Pulses: Normal pulses.     Heart sounds: Normal heart sounds.  Pulmonary:     Comments: No increased respiratory effort.  Breath sounds intact bilaterally.  No abnormal breath sounds.  Left-sided anterior chest wall tenderness to palpation, particularly over ribs 5 through 8.  No overlying skin changes.  Pain with hip flexion and increased intra-abdominal pressure. Abdominal:     Comments: Soft, nondistended.  No TTP.  No guarding.  No overlying skin changes.  Normoactive bowel sounds.  Musculoskeletal:     Cervical back: Normal range of motion and neck supple. No rigidity.  Skin:    General: Skin is dry.  Neurological:     Mental Status: She is alert and oriented to person, place, and time.     GCS: GCS eye subscore is 4. GCS verbal subscore is 5. GCS motor subscore is 6.  Psychiatric:        Mood and Affect: Mood normal.        Behavior: Behavior normal.        Thought Content: Thought content normal.      ED Results / Procedures / Treatments   Labs (all labs ordered are listed, but only abnormal results are displayed) Labs Reviewed  CBC - Abnormal; Notable for the following components:      Result Value   Hemoglobin 11.5 (*)    All other components within normal limits  BASIC METABOLIC PANEL - Abnormal; Notable for the following components:   Glucose, Bld 176 (*)    Calcium 8.7 (*)    All other components within normal limits  LIPASE, BLOOD  I-STAT BETA HCG BLOOD, ED  ([facility], [facility], [facility] ONLY)    EKG EKG Interpretation  Date/Time:  Saturday March 06 2019 14:19:44 EST Ventricular Rate:  83 PR Interval:    QRS Duration: 81 QT Interval:  368 QTC Calculation: 433 R Axis:   -  5 Text Interpretation: Sinus rhythm Confirmed by Dene Gentry [phone]) on 03/06/2019 2:31:19 PM   Radiology DG Ribs Unilateral W/Chest Left  Result Date: 03/06/2019 CLINICAL DATA:  Left-sided chest wall tenderness EXAM: LEFT RIBS AND CHEST - 3+ VIEW COMPARISON:  11/17/2018 FINDINGS: No fracture or other bone lesions are seen involving the ribs. There is no evidence of pneumothorax or pleural effusion. Both lungs are clear. Heart size and mediastinal contours are within normal limits. IMPRESSION: Negative. Electronically Signed   By: Monte Fantasia M.D.   On: 03/06/2019 16:36    Procedures Procedures (including critical care time)  Medications Ordered in ED Medications - No data to display  ED Course  I have reviewed the triage vital signs and the nursing notes.  Pertinent labs & imaging results that were available during my care of the patient were reviewed by me and considered in my medical decision making (see chart for details).    MDM Rules/Calculators/A&P                      Patient reports that she tried to contact her primary care provider today as she did not feel as though this is a medical emergency, present to the ED after she failed to hear back from them.  Her history and physical exam is consistent with anterior chest wall discomfort, likely musculoskeletal etiology.  Her tenderness is reproducible on physical exam with palpation as well as with certain movements.  Recommending naproxen and Tylenol as needed for discomfort.  Will refer to orthopedics for ongoing evaluation and management should her symptoms continue to persist despite rest and appropriate medication.  DG ribs unilateral w/ chest demonstrates no evidence of fracture, pneumothorax, consolidation  concerning for pneumonia, or other acute cardiopulmonary disease.  EKG demonstrated NSR with no evidence of ischemia.  Her lab work is all reassuring and her vital signs are within normal limits.  She is hemodynamically stable and neurovascularly intact.  Considered pulmonary embolism, however she is PERC negative.  Low suspicion at this time.  Do not feel as though any additional work-up or imaging is warranted.  Strict return precautions discussed with the patient.  All of the evaluation and work-up results were discussed with the patient and any family at bedside. They were provided opportunity to ask any additional questions and have none at this time. They have expressed understanding of verbal discharge instructions as well as return precautions and are agreeable to the plan.    Final Clinical Impression(s) / ED Diagnoses Final diagnoses:  Anterior chest wall pain    Rx / DC Orders ED Discharge Orders         Ordered    naproxen (NAPROSYN) 500 MG tablet  2 times daily between meals PRN     03/06/19 1702    acetaminophen (TYLENOL) 500 MG tablet  Every 6 hours PRN     03/06/19 1702           Corena Herter, PA-C 03/06/19 1702    Valarie Merino, MD 03/07/19 [phone]

## 2019-03-06 NOTE — Discharge Instructions (Addendum)
Please read the attachment on chest wall pain.  Please take your naproxen and Tylenol, as prescribed, for symptomatic relief.  Do not take other NSAIDs in conjunction with naproxen.  Please follow-up with your primary care provider regarding today's encounter.  I also encourage you to follow-up with orthopedics to schedule appointment should your symptoms continue to persist despite rest and appropriate medication.  Return to the ED or seek immediate medical attention for any new or worsening symptoms.

## 2019-09-23 ENCOUNTER — Emergency Department (HOSPITAL_COMMUNITY): Payer: Medicaid Other

## 2019-09-23 ENCOUNTER — Emergency Department (HOSPITAL_COMMUNITY)
Admission: EM | Admit: 2019-09-23 | Discharge: 2019-09-23 | Disposition: A | Discharge status: Home / Self Care | Payer: Medicaid Other | Attending: Emergency Medicine | Admitting: Emergency Medicine

## 2019-09-23 DIAGNOSIS — R0602 Shortness of breath: Secondary | ICD-10-CM | POA: Diagnosis not present

## 2019-09-23 DIAGNOSIS — K0889 Other specified disorders of teeth and supporting structures: Secondary | ICD-10-CM | POA: Diagnosis present

## 2019-09-23 DIAGNOSIS — Z79899 Other long term (current) drug therapy: Secondary | ICD-10-CM | POA: Diagnosis not present

## 2019-09-23 DIAGNOSIS — J45909 Unspecified asthma, uncomplicated: Secondary | ICD-10-CM | POA: Insufficient documentation

## 2019-09-23 DIAGNOSIS — I10 Essential (primary) hypertension: Secondary | ICD-10-CM | POA: Insufficient documentation

## 2019-09-23 DIAGNOSIS — E119 Type 2 diabetes mellitus without complications: Secondary | ICD-10-CM | POA: Diagnosis not present

## 2019-09-23 DIAGNOSIS — E039 Hypothyroidism, unspecified: Secondary | ICD-10-CM | POA: Diagnosis not present

## 2019-09-23 DIAGNOSIS — R079 Chest pain, unspecified: Secondary | ICD-10-CM

## 2019-09-23 DIAGNOSIS — R6889 Other general symptoms and signs: Secondary | ICD-10-CM

## 2019-09-23 DIAGNOSIS — U071 COVID-19: Secondary | ICD-10-CM | POA: Diagnosis not present

## 2019-09-23 LAB — BASIC METABOLIC PANEL
Anion gap: 11 (ref 5–15)
BUN: 10 mg/dL (ref 6–20)
CO2: 20 mmol/L — ABNORMAL LOW (ref 22–32)
Calcium: 8.3 mg/dL — ABNORMAL LOW (ref 8.9–10.3)
Chloride: 106 mmol/L (ref 98–111)
Creatinine, Ser: 0.72 mg/dL (ref 0.44–1.00)
GFR calc Af Amer: >60 mL/min (ref >60)
GFR calc non Af Amer: >60 mL/min (ref >60)
Glucose, Bld: 143 mg/dL — ABNORMAL HIGH (ref 70–99)
Potassium: 3.5 mmol/L (ref 3.5–5.1)
Sodium: 137 mmol/L (ref 135–145)

## 2019-09-23 LAB — SARS CORONAVIRUS 2 BY RT PCR (HOSPITAL ORDER, PERFORMED IN ~~LOC~~ HOSPITAL LAB): SARS Coronavirus 2: POSITIVE — AB

## 2019-09-23 LAB — CBC
HCT: 35.1 % — ABNORMAL LOW (ref 36.0–46.0)
Hemoglobin: 11.4 g/dL — ABNORMAL LOW (ref 12.0–15.0)
MCH: 29.5 pg (ref 26.0–34.0)
MCHC: 32.5 g/dL (ref 30.0–36.0)
MCV: 90.7 fL (ref 80.0–100.0)
Platelets: 274 10*3/uL (ref 150–400)
RBC: 3.87 MIL/uL (ref 3.87–5.11)
RDW: 14.5 % (ref 11.5–15.5)
WBC: 3.3 10*3/uL — ABNORMAL LOW (ref 4.0–10.5)
nRBC: 0 % (ref 0.0–0.2)

## 2019-09-23 LAB — TROPONIN I (HIGH SENSITIVITY): Troponin I (High Sensitivity): <2 ng/L (ref <18)

## 2019-09-23 NOTE — ED Triage Notes (Addendum)
Pt c/o dental pain started a month ago and get worse today.  Pt stated she's feel weak and generalize body aches. Pt stated she's been taking advil for pain. Pt denies fever.

## 2019-09-23 NOTE — Discharge Instructions (Signed)
You can take 600 mg of ibuprofen every 6 hours, you can take 1000 mg of Tylenol every 6 hours, you can alternate these every 3 or you can take them together.  

## 2019-09-23 NOTE — ED Provider Notes (Signed)
[facility] DEPT Provider Note   CSN: [id number] Arrival date & time: 09/23/19  1250     History Chief Complaint  Patient presents with  . Dental Pain    [name] is a 49 y.o. female.   Dental Pain Location:  Upper Quality:  Aching Severity:  Mild Onset quality:  Gradual Timing:  Constant Progression:  Unchanged Chronicity:  New Context comment:  "tooth infection" Relieved by:  Nothing Worsened by:  Nothing Ineffective treatments:  None tried Associated symptoms: congestion and headaches   Associated symptoms: no fever     HPI: A 49 year old patient with a history of treated diabetes presents for evaluation of chest pain. Initial onset of pain was more than 6 hours ago. The patient's chest pain is described as heaviness/pressure/tightness and is not worse with exertion. The patient's chest pain is not middle- or left-sided, is not well-localized, is not sharp and does not radiate to the arms/jaw/neck. The patient does not complain of nausea and denies diaphoresis. The patient has no history of stroke, has no history of peripheral artery disease, has not smoked in the past 90 days, has no relevant family history of coronary artery disease (first degree relative at less than age 33), is not hypertensive, has no history of hypercholesterolemia and does not have an elevated BMI (>=30).   Past Medical History:  Diagnosis Date  . Asthma   . Chronic back pain   . Chronic daily headache   . Chronic neck pain   . Diabetes mellitus without complication ([place])   . Sciatica   . Thyroid disease     Patient Active Problem List   Diagnosis Date Noted  . CAP (community acquired pneumonia) 08/06/2015  . Hypertension 08/06/2015  . Metabolic syndrome 96/22/2979  . Obesity 02/24/2015  . Metrorrhagia 02/24/2015  . Iron deficiency anemia 02/24/2015  . Glucose intolerance (impaired glucose tolerance) 02/21/2015  . Hypothyroidism 11/24/2014    Past  Surgical History:  Procedure Laterality Date  . CESAREAN SECTION    . CHOLECYSTECTOMY    . TUBAL LIGATION       OB History    Gravida  6   Para  4   Term  3   Preterm  1   AB  2   Living  3     SAB  2   TAB      Ectopic      Multiple      Live Births  4           Family History  Problem Relation Age of Onset  . Diabetes Mother   . Hypertension Mother   . Thyroid disease Mother     Social History   Tobacco Use  . Smoking status: Never Smoker  . Smokeless tobacco: Never Used  Vaping Use  . Vaping Use: Never used  Substance Use Topics  . Alcohol use: No  . Drug use: No    Home Medications Prior to Admission medications   Medication Sig Start Date End Date Taking? Authorizing Provider  acetaminophen (TYLENOL) 500 MG tablet Take 1 tablet (500 mg total) by mouth every 6 (six) hours as needed for moderate pain. 03/06/19   Corena Herter, PA-C  amLODipine (NORVASC) 5 MG tablet Take 1 tablet (5 mg total) by mouth daily. 11/18/18   Dorie Rank, MD  diazepam (VALIUM) 5 MG tablet Take 1 tablet (5 mg total) by mouth every 8 (eight) hours as needed for anxiety (muscle spasm). Patient  not taking: Reported on 08/14/2017 11/06/16   Tanna Furry, MD  Guaifenesin 1200 MG TB12 Take 1 tablet (1,200 mg total) by mouth 2 (two) times daily. Patient not taking: Reported on 10/30/2018 08/14/17   Dalia Heading, PA-C  ibuprofen (ADVIL,MOTRIN) 200 MG tablet Take 800 mg by mouth every 6 (six) hours as needed for moderate pain.    [provider]  meclizine (ANTIVERT) 25 MG tablet Take 1 tablet (25 mg total) by mouth 3 (three) times daily as needed for dizziness. Patient not taking: Reported on 08/14/2017 11/03/16   Malvin Johns, MD  naproxen (NAPROSYN) 500 MG tablet Take 1 tablet (500 mg total) by mouth 2 (two) times daily between meals as needed for moderate pain. 03/06/19   Corena Herter, PA-C  ondansetron (ZOFRAN ODT) 4 MG disintegrating tablet Take 1 tablet (4 mg  total) by mouth every 8 (eight) hours as needed for nausea. Patient not taking: Reported on 08/14/2017 11/06/16   Tanna Furry, MD  predniSONE (DELTASONE) 50 MG tablet Take 1 tablet (50 mg total) by mouth daily. Patient not taking: Reported on 10/30/2018 08/14/17   Dalia Heading, PA-C    Allergies    Bactrim [sulfamethoxazole-trimethoprim] and Penicillins  Review of Systems   Review of Systems  Constitutional: Negative for chills and fever.  HENT: Positive for congestion. Negative for rhinorrhea.   Respiratory: Positive for shortness of breath. Negative for cough.   Cardiovascular: Positive for chest pain. Negative for palpitations.  Gastrointestinal: Negative for diarrhea, nausea and vomiting.  Genitourinary: Negative for difficulty urinating and dysuria.  Musculoskeletal: Negative for arthralgias and back pain.  Skin: Negative for rash and wound.  Neurological: Positive for headaches. Negative for light-headedness.    Physical Exam Updated Vital Signs BP (!) 144/84 (BP Location: Right Arm)   Pulse 80   Temp 99.1 F (37.3 C) (Oral)   Resp 20   Ht 5\' 2"  (1.575 m)   Wt 95.3 kg   SpO2 99%   BMI 38.41 kg/m   Physical Exam Vitals and nursing note reviewed. Exam conducted with a chaperone present.  Constitutional:      General: She is not in acute distress.    Appearance: Normal appearance.  HENT:     Head: Normocephalic and atraumatic.     Nose: No rhinorrhea.  Eyes:     General:        Right eye: No discharge.        Left eye: No discharge.     Conjunctiva/sclera: Conjunctivae normal.  Cardiovascular:     Rate and Rhythm: Normal rate and regular rhythm.     Heart sounds: No murmur heard.   Pulmonary:     Effort: Pulmonary effort is normal. No respiratory distress.     Breath sounds: No stridor. No wheezing or rhonchi.  Chest:     Chest wall: No tenderness.  Abdominal:     General: Abdomen is flat. There is no distension.     Palpations: Abdomen is soft.    Musculoskeletal:        General: No tenderness or signs of injury.  Skin:    General: Skin is warm and dry.  Neurological:     General: No focal deficit present.     Mental Status: She is alert. Mental status is at baseline.     Motor: No weakness.  Psychiatric:        Mood and Affect: Mood normal.        Behavior: Behavior normal.  ED Results / Procedures / Treatments   Labs (all labs ordered are listed, but only abnormal results are displayed) Labs Reviewed  CBC - Abnormal; Notable for the following components:      Result Value   WBC 3.3 (*)    Hemoglobin 11.4 (*)    HCT 35.1 (*)    All other components within normal limits  BASIC METABOLIC PANEL - Abnormal; Notable for the following components:   CO2 20 (*)    Glucose, Bld 143 (*)    Calcium 8.3 (*)    All other components within normal limits  SARS CORONAVIRUS 2 BY RT PCR [facility] ORDER, [place] [facility])  TROPONIN I (HIGH SENSITIVITY)    EKG EKG Interpretation  Date/Time:  Thursday September 23 2019 13:52:55 EDT Ventricular Rate:  95 PR Interval:    QRS Duration: 85 QT Interval:  354 QTC Calculation: 445 R Axis:   61 Text Interpretation: Sinus rhythm Anteroseptal infarct, age indeterminate 36 Lead; [place] Confirmed by Dewaine Conger [phone]) on 09/23/2019 2:39:22 PM   Radiology DG Chest Portable 1 View  Result Date: 09/23/2019 CLINICAL DATA:  Dyspnea. EXAM: PORTABLE CHEST 1 VIEW COMPARISON:  March 06, 2019. FINDINGS: The heart size and mediastinal contours are within normal limits. Both lungs are clear. No pneumothorax or pleural effusion is noted. The visualized skeletal structures are unremarkable. IMPRESSION: No active disease. Electronically Signed   By: Marijo Conception M.D.   On: 09/23/2019 13:56    Procedures Procedures (including critical care time)  Medications Ordered in ED Medications - No data to display  ED Course  I have reviewed the triage vital signs and the  nursing notes.  Pertinent labs & imaging results that were available during my care of the patient were reviewed by me and considered in my medical decision making (see chart for details).    MDM Rules/Calculators/A&P HEAR Score: 2                        Chronic worsening tooth pain with no signs of definitive abscess or fluid collection, very poor dentition, just got insurance is working to get a Pharmacist, community.  No work-up or indication for further testing needed for the tooth.  She also comments on flulike symptoms to include shortness of breath and chest pain body aches runny nose congestion headache.  Covid testing will be sent that she has been [facility] in the community without vaccine.  EKG shows sinus rhythm without acute ischemic change interval abnormality or arrhythmia.  CBC is unremarkable.  Chest x-ray reviewed by radiology and myself shows no acute cardiopulm pathology,Covid test is pending.  Hear score is 2 she will likely be safe for discharge home pending troponin low likelihood of acute coronary syndrome being related to her symptoms.  She is PERC negative  Troponin is negative, with the chronicity of symptoms do not need a second 1.  Labs are unremarkable otherwise.  Chest x-ray shows no acute cardiopulmonary pathology.  She is safe for discharge home with strict return precautions regarding fatigue.  Her Covid test is pending.  She is given instructions how to follow-up with [place] as an outpatient.   Final Clinical Impression(s) / ED Diagnoses Final diagnoses:  Pain, dental  SOB (shortness of breath)  Chest pain, unspecified type  Flu-like symptoms    Rx / DC Orders ED Discharge Orders    None       Breck Coons, MD 09/23/19  1500  

## 2019-09-25 ENCOUNTER — Other Ambulatory Visit: Payer: Self-pay | Admitting: Infectious Diseases

## 2019-09-25 ENCOUNTER — Telehealth: Payer: Self-pay | Admitting: Infectious Diseases

## 2019-09-25 DIAGNOSIS — Z6838 Body mass index (BMI) 38.0-38.9, adult: Secondary | ICD-10-CM

## 2019-09-25 DIAGNOSIS — I10 Essential (primary) hypertension: Secondary | ICD-10-CM

## 2019-09-25 DIAGNOSIS — U071 COVID-19: Secondary | ICD-10-CM

## 2019-09-25 NOTE — Progress Notes (Signed)
I connected by phone with [name] on 09/25/2019 at 2:39 PM to discuss the potential use of a new treatment for mild to moderate COVID-19 viral infection in non-hospitalized patients.  This patient is a 49 y.o. female that meets the FDA criteria for Emergency Use Authorization of COVID monoclonal antibody casirivimab/imdevimab.  Has a (+) direct SARS-CoV-2 viral test result  Has mild or moderate COVID-19   Is NOT hospitalized due to COVID-19  Is within 10 days of symptom onset  Has at least one of the high risk factor(s) for progression to severe COVID-19 and/or hospitalization as defined in EUA.  Specific high risk criteria : BMI > 25 and Cardiovascular disease or hypertension   I have spoken and communicated the following to the patient or parent/caregiver regarding COVID monoclonal antibody treatment:  1. FDA has authorized the emergency use for the treatment of mild to moderate COVID-19 in adults and pediatric patients with positive results of direct SARS-CoV-2 viral testing who are 10 years of age and older weighing at least 40 kg, and who are at high risk for progressing to severe COVID-19 and/or hospitalization.  2. The significant known and potential risks and benefits of COVID monoclonal antibody, and the extent to which such potential risks and benefits are unknown.  3. Information on available alternative treatments and the risks and benefits of those alternatives, including clinical trials.  4. Patients treated with COVID monoclonal antibody should continue to self-isolate and use infection control measures (e.g., wear mask, isolate, social distance, avoid sharing personal items, clean and disinfect "high touch" surfaces, and frequent handwashing) according to CDC guidelines.   5. The patient or parent/caregiver has the option to accept or refuse COVID monoclonal antibody treatment.  After reviewing this information with the patient, The patient agreed to proceed with  receiving casirivimab\imdevimab infusion and will be provided a copy of the Fact sheet prior to receiving the infusion. [name] 09/25/2019 2:39 PM
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
## 2019-09-27 ENCOUNTER — Ambulatory Visit (HOSPITAL_COMMUNITY): Payer: Medicaid Other

## 2019-09-28 ENCOUNTER — Ambulatory Visit (HOSPITAL_COMMUNITY)
Admission: RE | Admit: 2019-09-28 | Discharge: 2019-09-28 | Disposition: A | Discharge status: Home / Self Care | Payer: Medicaid Other | Source: Ambulatory Visit | Attending: Pulmonary Disease | Admitting: Pulmonary Disease

## 2019-09-28 DIAGNOSIS — U071 COVID-19: Secondary | ICD-10-CM | POA: Insufficient documentation

## 2019-09-28 DIAGNOSIS — I10 Essential (primary) hypertension: Secondary | ICD-10-CM | POA: Diagnosis present

## 2019-09-28 DIAGNOSIS — Z6838 Body mass index (BMI) 38.0-38.9, adult: Secondary | ICD-10-CM | POA: Insufficient documentation

## 2019-09-28 MED ORDER — FAMOTIDINE IN NACL 20-0.9 MG/50ML-% IV SOLN: 20.0000 mg | Freq: Once | INTRAVENOUS | Status: DC | PRN

## 2019-09-28 MED ORDER — ALBUTEROL SULFATE HFA 108 (90 BASE) MCG/ACT IN AERS: 2.0000 | INHALATION_SPRAY | Freq: Once | RESPIRATORY_TRACT | Status: DC | PRN

## 2019-09-28 MED ORDER — SODIUM CHLORIDE 0.9 % IV SOLN
1200.0000 mg | Freq: Once | INTRAVENOUS | Status: AC
  Administered 2019-09-28: 1200 mg via INTRAVENOUS

## 2019-09-28 MED ORDER — METHYLPREDNISOLONE SODIUM SUCC 125 MG IJ SOLR: 125.0000 mg | Freq: Once | INTRAMUSCULAR | Status: DC | PRN

## 2019-09-28 MED ORDER — DIPHENHYDRAMINE HCL 50 MG/ML IJ SOLN: 50.0000 mg | Freq: Once | INTRAMUSCULAR | Status: DC | PRN

## 2019-09-28 MED ORDER — EPINEPHRINE 0.3 MG/0.3ML IJ SOAJ: 0.3000 mg | Freq: Once | INTRAMUSCULAR | Status: DC | PRN

## 2019-09-28 MED ORDER — SODIUM CHLORIDE 0.9 % IV SOLN: INTRAVENOUS | Status: DC | PRN

## 2019-09-28 NOTE — Progress Notes (Signed)
  Diagnosis: COVID-19  Physician:  Procedure: Covid Infusion Clinic Med: casirivimab\imdevimab infusion - Provided patient with casirivimab\imdevimab fact sheet for patients, parents and caregivers prior to infusion.  Complications: No immediate complications noted.  Discharge: Discharged home   [name] 09/28/2019

## 2019-09-28 NOTE — Discharge Instructions (Signed)

## 2021-01-01 ENCOUNTER — Ambulatory Visit: Payer: PRIVATE HEALTH INSURANCE | Admitting: Podiatry

## 2021-01-01 ENCOUNTER — Other Ambulatory Visit: Payer: Self-pay

## 2021-01-01 ENCOUNTER — Emergency Department (HOSPITAL_COMMUNITY): Payer: Medicaid Other

## 2021-01-01 ENCOUNTER — Encounter (HOSPITAL_COMMUNITY): Payer: Self-pay

## 2021-01-01 ENCOUNTER — Emergency Department (HOSPITAL_COMMUNITY)
Admission: EM | Admit: 2021-01-01 | Discharge: 2021-01-01 | Disposition: A | Discharge status: Home / Self Care | Payer: Medicaid Other | Attending: Emergency Medicine | Admitting: Emergency Medicine

## 2021-01-01 DIAGNOSIS — E039 Hypothyroidism, unspecified: Secondary | ICD-10-CM | POA: Diagnosis not present

## 2021-01-01 DIAGNOSIS — I10 Essential (primary) hypertension: Secondary | ICD-10-CM | POA: Diagnosis not present

## 2021-01-01 DIAGNOSIS — E119 Type 2 diabetes mellitus without complications: Secondary | ICD-10-CM | POA: Diagnosis not present

## 2021-01-01 DIAGNOSIS — Z79899 Other long term (current) drug therapy: Secondary | ICD-10-CM | POA: Insufficient documentation

## 2021-01-01 DIAGNOSIS — R519 Headache, unspecified: Secondary | ICD-10-CM | POA: Insufficient documentation

## 2021-01-01 DIAGNOSIS — J45909 Unspecified asthma, uncomplicated: Secondary | ICD-10-CM | POA: Insufficient documentation

## 2021-01-01 DIAGNOSIS — M62838 Other muscle spasm: Secondary | ICD-10-CM

## 2021-01-01 LAB — CBC WITH DIFFERENTIAL/PLATELET
Abs Immature Granulocytes: 0.02 10*3/uL (ref 0.00–0.07)
Basophils Absolute: 0 10*3/uL (ref 0.0–0.1)
Basophils Relative: 0 %
Eosinophils Absolute: 0.1 10*3/uL (ref 0.0–0.5)
Eosinophils Relative: 1 %
HCT: 37 % (ref 36.0–46.0)
Hemoglobin: 11.5 g/dL — ABNORMAL LOW (ref 12.0–15.0)
Immature Granulocytes: 0 %
Lymphocytes Relative: 35 %
Lymphs Abs: 3.4 10*3/uL (ref 0.7–4.0)
MCH: 27.7 pg (ref 26.0–34.0)
MCHC: 31.1 g/dL (ref 30.0–36.0)
MCV: 89.2 fL (ref 80.0–100.0)
Monocytes Absolute: 0.4 10*3/uL (ref 0.1–1.0)
Monocytes Relative: 5 %
Neutro Abs: 5.8 10*3/uL (ref 1.7–7.7)
Neutrophils Relative %: 59 %
Platelets: 412 10*3/uL — ABNORMAL HIGH (ref 150–400)
RBC: 4.15 MIL/uL (ref 3.87–5.11)
RDW: 13.2 % (ref 11.5–15.5)
WBC: 9.8 10*3/uL (ref 4.0–10.5)
nRBC: 0 % (ref 0.0–0.2)

## 2021-01-01 LAB — COMPREHENSIVE METABOLIC PANEL
ALT: 14 U/L (ref 0–44)
AST: 16 U/L (ref 15–41)
Albumin: 4.1 g/dL (ref 3.5–5.0)
Alkaline Phosphatase: 80 U/L (ref 38–126)
Anion gap: 8 (ref 5–15)
BUN: 13 mg/dL (ref 6–20)
CO2: 26 mmol/L (ref 22–32)
Calcium: 9.3 mg/dL (ref 8.9–10.3)
Chloride: 105 mmol/L (ref 98–111)
Creatinine, Ser: 0.66 mg/dL (ref 0.44–1.00)
GFR, Estimated: >60 mL/min (ref >60)
Glucose, Bld: 117 mg/dL — ABNORMAL HIGH (ref 70–99)
Potassium: 3.8 mmol/L (ref 3.5–5.1)
Sodium: 139 mmol/L (ref 135–145)
Total Bilirubin: 0.7 mg/dL (ref 0.3–1.2)
Total Protein: 8.6 g/dL — ABNORMAL HIGH (ref 6.5–8.1)

## 2021-01-01 LAB — CK: Total CK: 111 U/L (ref 38–234)

## 2021-01-01 LAB — T4, FREE: Free T4: 0.75 ng/dL (ref 0.61–1.12)

## 2021-01-01 LAB — CBG MONITORING, ED: Glucose-Capillary: 135 mg/dL — ABNORMAL HIGH (ref 70–99)

## 2021-01-01 LAB — TSH: TSH: 7.645 u[IU]/mL — ABNORMAL HIGH (ref 0.350–4.500)

## 2021-01-01 MED ORDER — DIAZEPAM 5 MG/ML IJ SOLN
5.0000 mg | Freq: Once | INTRAMUSCULAR | Status: AC
  Administered 2021-01-01: 5 mg via INTRAVENOUS
  Filled 2021-01-01: qty 2

## 2021-01-01 MED ORDER — ONDANSETRON 4 MG PO TBDP
4.0000 mg | ORAL_TABLET | Freq: Once | ORAL | Status: AC
  Administered 2021-01-01: 4 mg via ORAL
  Filled 2021-01-01: qty 1

## 2021-01-01 MED ORDER — CYCLOBENZAPRINE HCL 5 MG PO TABS: 5.0000 mg | ORAL_TABLET | Freq: Three times a day (TID) | ORAL | 0 refills | Status: DC | PRN

## 2021-01-01 MED ORDER — HYDROMORPHONE HCL 1 MG/ML IJ SOLN
1.0000 mg | Freq: Once | INTRAMUSCULAR | Status: AC
  Administered 2021-01-01: 1 mg via INTRAVENOUS
  Filled 2021-01-01: qty 1

## 2021-01-01 MED ORDER — METHYLPREDNISOLONE 4 MG PO TBPK: ORAL_TABLET | ORAL | 0 refills | Status: DC

## 2021-01-01 MED ORDER — HYDROCODONE-ACETAMINOPHEN 5-325 MG PO TABS: 1.0000 | ORAL_TABLET | Freq: Four times a day (QID) | ORAL | 0 refills | Status: DC | PRN

## 2021-01-01 NOTE — ED Triage Notes (Signed)
Patient c/o headache that started 3 days ago and worse today. Patient states the pain is so bad that it causes her not to eat. Patient denies any dizziness, nausea, blurred vision and light sensitivity

## 2021-01-01 NOTE — ED Provider Notes (Signed)
[facility] DEPT Provider Note   CSN: [id number] Arrival date & time: 01/01/21  1032     History Chief Complaint  Patient presents with   Headache    [name] is a 50 y.o. female here presenting with neck pain and headaches.  Patient states that she has been having posterior headaches and neck pain for the last 2 to 3 days.  She states that it progressively got worse and now this is 10 out of 10 pain.  Patient denies any trauma or injury.  Patient attributes to reflux.  She states that she has a history of hypothyroidism and thought that it may be her thyroid problem initially.  Denies any trouble swallowing.    The history is provided by the patient.      Past Medical History:  Diagnosis Date   Asthma    Chronic back pain    Chronic daily headache    Chronic neck pain    Diabetes mellitus without complication ([place])    Sciatica    Thyroid disease     Patient Active Problem List   Diagnosis Date Noted   CAP (community acquired pneumonia) 08/06/2015   Hypertension 47/09/6281   Metabolic syndrome 66/29/4765   Obesity 02/24/2015   Metrorrhagia 02/24/2015   Iron deficiency anemia 02/24/2015   Glucose intolerance (impaired glucose tolerance) 02/21/2015   Hypothyroidism 11/24/2014    Past Surgical History:  Procedure Laterality Date   CESAREAN SECTION     CHOLECYSTECTOMY     TUBAL LIGATION       OB History     Gravida  6   Para  4   Term  3   Preterm  1   AB  2   Living  3      SAB  2   IAB      Ectopic      Multiple      Live Births  4           Family History  Problem Relation Age of Onset   Diabetes Mother    Hypertension Mother    Thyroid disease Mother     Social History   Tobacco Use   Smoking status: Never   Smokeless tobacco: Never  Vaping Use   Vaping Use: Never used  Substance Use Topics   Alcohol use: No   Drug use: No    Home Medications Prior to Admission medications    Medication Sig Start Date End Date Taking? Authorizing Provider  acetaminophen (TYLENOL) 500 MG tablet Take 1 tablet (500 mg total) by mouth every 6 (six) hours as needed for moderate pain. 03/06/19   Corena Herter, PA-C  amLODipine (NORVASC) 5 MG tablet Take 1 tablet (5 mg total) by mouth daily. 11/18/18   Dorie Rank, MD  diazepam (VALIUM) 5 MG tablet Take 1 tablet (5 mg total) by mouth every 8 (eight) hours as needed for anxiety (muscle spasm). Patient not taking: Reported on 08/14/2017 11/06/16   Tanna Furry, MD  Guaifenesin 1200 MG TB12 Take 1 tablet (1,200 mg total) by mouth 2 (two) times daily. 08/14/17   Lawyer, Harrell Gave, PA-C  ibuprofen (ADVIL,MOTRIN) 200 MG tablet Take 800 mg by mouth every 6 (six) hours as needed for moderate pain.    [provider]  meclizine (ANTIVERT) 25 MG tablet Take 1 tablet (25 mg total) by mouth 3 (three) times daily as needed for dizziness. 11/03/16   Malvin Johns, MD  naproxen (NAPROSYN) 500 MG  tablet Take 1 tablet (500 mg total) by mouth 2 (two) times daily between meals as needed for moderate pain. 03/06/19   Corena Herter, PA-C  ondansetron (ZOFRAN ODT) 4 MG disintegrating tablet Take 1 tablet (4 mg total) by mouth every 8 (eight) hours as needed for nausea. 11/06/16   Tanna Furry, MD  predniSONE (DELTASONE) 50 MG tablet Take 1 tablet (50 mg total) by mouth daily. 08/14/17   Lawyer, Harrell Gave, PA-C    Allergies    Bactrim [sulfamethoxazole-trimethoprim] and Penicillins  Review of Systems   Review of Systems  Musculoskeletal:  Positive for neck pain.  Neurological:  Positive for headaches.  All other systems reviewed and are negative.  Physical Exam Updated Vital Signs BP 133/63   Pulse 72   Temp 97.8 F (36.6 C) (Oral)   Resp 16   Ht 5\' 2"  (1.575 m)   Wt 82.6 kg   LMP 12/01/2020 (Approximate)   SpO2 96%   BMI 33.29 kg/m   Physical Exam Vitals and nursing note reviewed.  Constitutional:      Comments: Uncomfortable  HENT:      Head: Normocephalic.     Mouth/Throat:     Mouth: Mucous membranes are moist.  Eyes:     Extraocular Movements: Extraocular movements intact.  Neck:     Comments: Tenderness in the left paracervical area.  Patient has decreased range of motion due to pain.  No obvious meningeal sign.  Questionable small goiter on thyroid exam Cardiovascular:     Rate and Rhythm: Normal rate and regular rhythm.     Heart sounds: Normal heart sounds.  Pulmonary:     Effort: Pulmonary effort is normal.     Breath sounds: Normal breath sounds.  Abdominal:     General: Bowel sounds are normal.     Palpations: Abdomen is soft.  Musculoskeletal:        General: Normal range of motion.  Skin:    General: Skin is warm.  Neurological:     Mental Status: She is alert and oriented to person, place, and time.     Cranial Nerves: No cranial nerve deficit, dysarthria or facial asymmetry.     Sensory: No sensory deficit.     Motor: No weakness.  Psychiatric:        Mood and Affect: Mood normal.        Behavior: Behavior normal.    ED Results / Procedures / Treatments   Labs (all labs ordered are listed, but only abnormal results are displayed) Labs Reviewed  CBC WITH DIFFERENTIAL/PLATELET - Abnormal; Notable for the following components:      Result Value   Hemoglobin 11.5 (*)    Platelets 412 (*)    All other components within normal limits  COMPREHENSIVE METABOLIC PANEL - Abnormal; Notable for the following components:   Glucose, Bld 117 (*)    Total Protein 8.6 (*)    All other components within normal limits  TSH - Abnormal; Notable for the following components:   TSH 7.645 (*)    All other components within normal limits  CBG MONITORING, ED - Abnormal; Notable for the following components:   Glucose-Capillary 135 (*)    All other components within normal limits  CK  T4, FREE    EKG None  Radiology DG Cervical Spine Complete  Result Date: 01/01/2021 CLINICAL DATA:   Left-greater-than-right neck pain, onset 4 days ago EXAM: CERVICAL SPINE - COMPLETE 4+ VIEW COMPARISON:  Cervical spine radiographs 06/07/2014 FINDINGS:  The cervical vertebral bodies are imaged through the C7 vertebral body in the lateral projection. The imaged vertebral body heights are preserved. There is straightened curvature in the cervical spine. There is no antero or retrolisthesis. There is mild degenerative endplate change with anterior osteophytes at C5-C6. The disc spaces are preserved. The osseous neural foramina are patent. The prevertebral soft tissues are unremarkable. IMPRESSION: Mild degenerative changes at C5-C6.  No acute findings. Electronically Signed   By: Valetta Mole M.D.   On: 01/01/2021 11:40   CT HEAD WO CONTRAST (5MM)  Result Date: 01/01/2021 CLINICAL DATA:  Headache dizziness and blurred vision. EXAM: CT HEAD WITHOUT CONTRAST CT CERVICAL SPINE WITHOUT CONTRAST TECHNIQUE: Multidetector CT imaging of the head and cervical spine was performed following the standard protocol without intravenous contrast. Multiplanar CT image reconstructions of the cervical spine were also generated. COMPARISON:  November 06, 2016. FINDINGS: CT HEAD FINDINGS Brain: No evidence of acute infarction, hemorrhage, hydrocephalus, extra-axial collection or mass lesion/mass effect. Vascular: No hyperdense vessel or unexpected calcification. Skull: Normal. Negative for fracture or focal lesion. Sinuses/Orbits: Visualized portions of the paranasal sinuses and mastoid air cells are predominantly clear. Orbits are grossly unremarkable. Other: None CT CERVICAL SPINE FINDINGS Alignment: Preservation of the normal cervical lordosis. No evidence of traumatic listhesis. Skull base and vertebrae: No acute fracture. Small well corticated osseous fragment along the superior aspect of the dens without adjacent soft tissue swelling likely reflects a persistent ossicular terminale. No primary bone lesion or focal pathologic  process. Soft tissues and spinal canal: There is focal thickening of the anterior longitudinal ligament anterior to the dens with some internal mineralization favored to reflect hydroxy appetite deposition disease. Otherwise, no prevertebral fluid or swelling. No visible canal hematoma. Disc levels:  Mild multilevel degenerative changes spine. Upper chest: Negative. Other: None IMPRESSION: 1. No CT evidence of acute intracranial process. 2. No definite evidence of acute fracture or traumatic listhesis of the cervical spine. 3. Focal thickening of the anterior longitudinal ligament anterior to the dens with some internal mineralization common nonspecific possibly reflecting mild Hydroxyapatite deposition disease. 4. Small well corticated osseous fragment along the superior aspect of the dens without adjacent soft tissue swelling likely reflects a persistent ossicular terminale. Electronically Signed   By: Dahlia Bailiff M.D.   On: 01/01/2021 19:35   CT Cervical Spine Wo Contrast  Result Date: 01/01/2021 CLINICAL DATA:  Headache dizziness and blurred vision. EXAM: CT HEAD WITHOUT CONTRAST CT CERVICAL SPINE WITHOUT CONTRAST TECHNIQUE: Multidetector CT imaging of the head and cervical spine was performed following the standard protocol without intravenous contrast. Multiplanar CT image reconstructions of the cervical spine were also generated. COMPARISON:  November 06, 2016. FINDINGS: CT HEAD FINDINGS Brain: No evidence of acute infarction, hemorrhage, hydrocephalus, extra-axial collection or mass lesion/mass effect. Vascular: No hyperdense vessel or unexpected calcification. Skull: Normal. Negative for fracture or focal lesion. Sinuses/Orbits: Visualized portions of the paranasal sinuses and mastoid air cells are predominantly clear. Orbits are grossly unremarkable. Other: None CT CERVICAL SPINE FINDINGS Alignment: Preservation of the normal cervical lordosis. No evidence of traumatic listhesis. Skull base and  vertebrae: No acute fracture. Small well corticated osseous fragment along the superior aspect of the dens without adjacent soft tissue swelling likely reflects a persistent ossicular terminale. No primary bone lesion or focal pathologic process. Soft tissues and spinal canal: There is focal thickening of the anterior longitudinal ligament anterior to the dens with some internal mineralization favored to reflect hydroxy appetite deposition disease. Otherwise, no  prevertebral fluid or swelling. No visible canal hematoma. Disc levels:  Mild multilevel degenerative changes spine. Upper chest: Negative. Other: None IMPRESSION: 1. No CT evidence of acute intracranial process. 2. No definite evidence of acute fracture or traumatic listhesis of the cervical spine. 3. Focal thickening of the anterior longitudinal ligament anterior to the dens with some internal mineralization common nonspecific possibly reflecting mild Hydroxyapatite deposition disease. 4. Small well corticated osseous fragment along the superior aspect of the dens without adjacent soft tissue swelling likely reflects a persistent ossicular terminale. Electronically Signed   By: Dahlia Bailiff M.D.   On: 01/01/2021 19:35    Procedures Procedures   Medications Ordered in ED Medications  HYDROmorphone (DILAUDID) injection 1 mg (1 mg Intravenous Given 01/01/21 1903)  diazepam (VALIUM) injection 5 mg (5 mg Intravenous Given 01/01/21 1902)    ED Course  I have reviewed the triage vital signs and the nursing notes.  Pertinent labs & imaging results that were available during my care of the patient were reviewed by me and considered in my medical decision making (see chart for details).    MDM Rules/Calculators/A&P                           Jalon Blackwelder is a 50 y.o. female here presenting with headache and neck pain.  I think likely muscle spasm versus cervical radiculopathy.  Patient may have a small goiter on exam.  Plan to get CT head and CT  cervical spine.  We will get CBC and CMP and TSH.  Will give pain medicine and muscle relaxants and reassess  8:18 PM Patient's chemistry is unremarkable.  TSH is 7.6 which is similar to previous.  CT head unremarkable and CT cervical spine showed thickening of the anterior longitudinal ligament.  Patient felt much better after pain medicine and muscle relaxant.  Will discharge home with steroids, pain medicine and muscle relaxants and spine surgery follow-up  Final Clinical Impression(s) / ED Diagnoses Final diagnoses:  None    Rx / DC Orders ED Discharge Orders     None        Drenda Freeze, MD 01/01/21 2020

## 2021-01-01 NOTE — ED Provider Notes (Signed)
Emergency Medicine Provider Triage Evaluation Note  [name] , a 50 y.o. female  was evaluated in triage.  Pt complains of headache and neck pain symptoms started a few days ago.  Back of her head is very tender to touch in 1 spot.  It hurts to move.  Review of Systems  Positive: Headache and neck pain, gradual onset over a few days Negative: No fever.  No numbness weakness  Physical Exam  BP (!) 149/86 (BP Location: Left Arm)   Pulse 73   Temp 97.8 F (36.6 C) (Oral)   Resp 18   Ht 1.575 m (5\' 2" )   Wt 82.6 kg   LMP 12/01/2020 (Approximate)   SpO2 100%   BMI 33.29 kg/m  Gen:   Awake, no distress   Resp:  Normal effort  MSK:   Moves extremities without difficulty  Other:  Tenderness palpation left paraspinal muscles and base of left occiput  Medical Decision Making  Medically screening exam initiated at 11:14 AM.  Appropriate orders placed.  Alexea Blase was informed that the remainder of the evaluation will be completed by another provider, this initial triage assessment does not replace that evaluation, and the importance of remaining in the ED until their evaluation is complete.  Pain appears to be musculoskeletal in nature.     Dorie Rank, MD 01/01/21 1116

## 2021-01-01 NOTE — Discharge Instructions (Addendum)
You likely have neck muscle spasms.  Take medrol dose pack as prescribed.  Take Motrin or Tylenol for pain  Take Flexeril for muscle spasms  Take Norco for severe pain  See spine surgery for follow-up  Return to ER if you have worse neck pain, headache, fever

## 2021-12-20 ENCOUNTER — Emergency Department (HOSPITAL_COMMUNITY): Payer: Commercial Managed Care - HMO

## 2021-12-20 ENCOUNTER — Encounter (HOSPITAL_COMMUNITY): Payer: Self-pay | Admitting: Emergency Medicine

## 2021-12-20 ENCOUNTER — Emergency Department (HOSPITAL_COMMUNITY)
Admission: EM | Admit: 2021-12-20 | Discharge: 2021-12-20 | Disposition: A | Discharge status: Home / Self Care | Payer: Commercial Managed Care - HMO | Attending: Emergency Medicine | Admitting: Emergency Medicine

## 2021-12-20 DIAGNOSIS — R509 Fever, unspecified: Secondary | ICD-10-CM | POA: Diagnosis present

## 2021-12-20 DIAGNOSIS — Z7951 Long term (current) use of inhaled steroids: Secondary | ICD-10-CM | POA: Diagnosis not present

## 2021-12-20 DIAGNOSIS — Z20822 Contact with and (suspected) exposure to covid-19: Secondary | ICD-10-CM | POA: Insufficient documentation

## 2021-12-20 DIAGNOSIS — R062 Wheezing: Secondary | ICD-10-CM

## 2021-12-20 DIAGNOSIS — J069 Acute upper respiratory infection, unspecified: Secondary | ICD-10-CM | POA: Insufficient documentation

## 2021-12-20 DIAGNOSIS — E119 Type 2 diabetes mellitus without complications: Secondary | ICD-10-CM | POA: Insufficient documentation

## 2021-12-20 DIAGNOSIS — J45909 Unspecified asthma, uncomplicated: Secondary | ICD-10-CM | POA: Insufficient documentation

## 2021-12-20 LAB — RESP PANEL BY RT-PCR (FLU A&B, COVID) ARPGX2
Influenza A by PCR: NEGATIVE
Influenza B by PCR: NEGATIVE
SARS Coronavirus 2 by RT PCR: NEGATIVE

## 2021-12-20 MED ORDER — METHYLPREDNISOLONE 4 MG PO TBPK: ORAL_TABLET | ORAL | 0 refills | Status: DC

## 2021-12-20 MED ORDER — ALBUTEROL SULFATE HFA 108 (90 BASE) MCG/ACT IN AERS: 1.0000 | INHALATION_SPRAY | Freq: Four times a day (QID) | RESPIRATORY_TRACT | 0 refills | Status: DC | PRN

## 2021-12-20 MED ORDER — BENZONATATE 100 MG PO CAPS: 100.0000 mg | ORAL_CAPSULE | Freq: Three times a day (TID) | ORAL | 0 refills | Status: DC

## 2021-12-20 NOTE — Discharge Instructions (Signed)
Take the medications as prescribed  Return for new or worsening symptoms 

## 2021-12-20 NOTE — ED Provider Notes (Signed)
[facility] DEPT Provider Note   CSN: [id number] Arrival date & time: 12/20/21  1450    History  Chief Complaint  Patient presents with   Fever   Cough   Nasal Congestion    [name] is a 51 y.o. female history of diabetes, chronic back pain, asthma here for evaluation of fever, cough, generalized myalgias.  Symptoms began 1 week ago.  States she hurts all over.  No chest pain, back pain, shortness of breath.  No lower extremity swelling.  Associated congestion and rhinorrhea.  Was around someone who was sick however does not know what they are diagnosed with.  She is tolerating p.o. intake at home.  No sudden onset headache, sore throat, abdominal pain. She feels like she has a wheeze  HPI     Home Medications Prior to Admission medications   Medication Sig Start Date End Date Taking? Authorizing Provider  albuterol (VENTOLIN HFA) 108 (90 Base) MCG/ACT inhaler Inhale 1-2 puffs into the lungs every 6 (six) hours as needed for wheezing or shortness of breath. 12/20/21  Yes Ezeriah Luty A, PA-C  benzonatate (TESSALON) 100 MG capsule Take 1 capsule (100 mg total) by mouth every 8 (eight) hours. 12/20/21  Yes Kazuma Elena A, PA-C  methylPREDNISolone (MEDROL DOSEPAK) 4 MG TBPK tablet Take as prescribed 12/20/21  Yes Zamarian Scarano A, PA-C  acetaminophen (TYLENOL) 500 MG tablet Take 1 tablet (500 mg total) by mouth every 6 (six) hours as needed for moderate pain. 03/06/19   Corena Herter, PA-C  amLODipine (NORVASC) 5 MG tablet Take 1 tablet (5 mg total) by mouth daily. 11/18/18   Dorie Rank, MD  cyclobenzaprine (FLEXERIL) 5 MG tablet Take 1 tablet (5 mg total) by mouth 3 (three) times daily as needed for muscle spasms. 01/01/21   Drenda Freeze, MD  diazepam (VALIUM) 5 MG tablet Take 1 tablet (5 mg total) by mouth every 8 (eight) hours as needed for anxiety (muscle spasm). Patient not taking: Reported on 08/14/2017 11/06/16   Tanna Furry, MD   Guaifenesin 1200 MG TB12 Take 1 tablet (1,200 mg total) by mouth 2 (two) times daily. 08/14/17   Lawyer, Harrell Gave, PA-C  HYDROcodone-acetaminophen (NORCO/VICODIN) 5-325 MG tablet Take 1 tablet by mouth every 6 (six) hours as needed. 01/01/21   Drenda Freeze, MD  ibuprofen (ADVIL,MOTRIN) 200 MG tablet Take 800 mg by mouth every 6 (six) hours as needed for moderate pain.    [provider]  meclizine (ANTIVERT) 25 MG tablet Take 1 tablet (25 mg total) by mouth 3 (three) times daily as needed for dizziness. 11/03/16   Malvin Johns, MD  naproxen (NAPROSYN) 500 MG tablet Take 1 tablet (500 mg total) by mouth 2 (two) times daily between meals as needed for moderate pain. 03/06/19   Corena Herter, PA-C  ondansetron (ZOFRAN ODT) 4 MG disintegrating tablet Take 1 tablet (4 mg total) by mouth every 8 (eight) hours as needed for nausea. 11/06/16   Tanna Furry, MD  predniSONE (DELTASONE) 50 MG tablet Take 1 tablet (50 mg total) by mouth daily. 08/14/17   Lawyer, Harrell Gave, PA-C      Allergies    Bactrim [sulfamethoxazole-trimethoprim] and Penicillins    Review of Systems   Review of Systems  HENT:  Positive for congestion, postnasal drip and rhinorrhea. Negative for dental problem, drooling, ear discharge, ear pain, facial swelling, hearing loss, mouth sores, nosebleeds, sinus pressure, sinus pain, sneezing, sore throat, tinnitus, trouble swallowing and voice change.  Eyes: Negative.   Respiratory:  Positive for cough. Negative for apnea, choking, chest tightness, shortness of breath, wheezing and stridor.   Cardiovascular: Negative.   Gastrointestinal: Negative.   Genitourinary: Negative.   Musculoskeletal:  Positive for myalgias.  Skin: Negative.   Neurological: Negative.   All other systems reviewed and are negative.   Physical Exam Updated Vital Signs BP (!) 155/96 (BP Location: Left Arm)   Pulse 99   Temp 98.3 F (36.8 C)   Resp 16   LMP 11/07/2021 (Approximate)   SpO2  99%  Physical Exam Vitals and nursing note reviewed.  Constitutional:      General: She is not in acute distress.    Appearance: She is well-developed. She is not ill-appearing, toxic-appearing or diaphoretic.  HENT:     Head: Normocephalic and atraumatic.     Nose: Congestion and rhinorrhea present.     Mouth/Throat:     Mouth: Mucous membranes are moist.  Eyes:     Pupils: Pupils are equal, round, and reactive to light.  Cardiovascular:     Rate and Rhythm: Normal rate.     Pulses: Normal pulses.     Heart sounds: Normal heart sounds.  Pulmonary:     Effort: Pulmonary effort is normal. No respiratory distress.     Breath sounds: Normal air entry. No stridor, decreased air movement or transmitted upper airway sounds. Wheezing present.     Comments: Minimal expiratory wheeze Abdominal:     General: Bowel sounds are normal. There is no distension.     Tenderness: There is no abdominal tenderness. There is no right CVA tenderness, left CVA tenderness, guarding or rebound.  Musculoskeletal:        General: No swelling, tenderness, deformity or signs of injury. Normal range of motion.     Cervical back: Normal range of motion.     Right lower leg: No edema.     Left lower leg: No edema.  Skin:    General: Skin is warm and dry.     Capillary Refill: Capillary refill takes less than 2 seconds.  Neurological:     General: No focal deficit present.     Mental Status: She is alert and oriented to person, place, and time.  Psychiatric:        Mood and Affect: Mood normal.    ED Results / Procedures / Treatments   Labs (all labs ordered are listed, but only abnormal results are displayed) Labs Reviewed  RESP PANEL BY RT-PCR (FLU A&B, COVID) [id number]    EKG None  Radiology No results found.  Procedures Procedures    Medications Ordered in ED Medications - No data to display  ED Course/ Medical Decision Making/ A&P    51 year old here for evaluation of fever, cough and  congestion over the last week or so.  Known sick contacts.  Lungs clear.  Abdomen soft, nontender.  She is no clinical evidence of VTE on exam.  She denies any chest pain or shortness of breath.  Posterior oropharynx clear.  Some mild congestion and rhinorrhea bilaterally. Mild expiratory wheeze.  Labs and imaging personally viewed and interpreted:  EKG without ischemic changes Chest xray without filtrates, cardiomegaly, pulm edema, pneumothorax COVID/ Flu neg  Patient reassessed.  No hypoxia, tachycardia or tachypnea.  Likely viral in nature however given mild expiratory wheeze we will treat as asthma exacerbation.  Encouraged close follow-up outpatient, return for new or worsening symptoms.  Low suspicion for sepsis, fluid overload, bacterial pneumonia,  ACS, PE, pneumothorax.  The patient has been appropriately medically screened and/or stabilized in the ED. I have low suspicion for any other emergent medical condition which would require further screening, evaluation or treatment in the ED or require inpatient management.  Patient is hemodynamically stable and in no acute distress.  Patient able to ambulate in department prior to ED.  Evaluation does not show acute pathology that would require ongoing or additional emergent interventions while in the emergency department or further inpatient treatment.  I have discussed the diagnosis with the patient and answered all questions.  Pain is been managed while in the emergency department and patient has no further complaints prior to discharge.  Patient is comfortable with plan discussed in room and is stable for discharge at this time.  I have discussed strict return precautions for returning to the emergency department.  Patient was encouraged to follow-up with PCP/specialist refer to at discharge.                           Medical Decision Making Amount and/or Complexity of Data Reviewed External Data Reviewed: labs, radiology, ECG and  notes. Labs: ordered. Decision-making details documented in ED Course. Radiology: ordered and independent interpretation performed. Decision-making details documented in ED Course. ECG/medicine tests: ordered and independent interpretation performed. Decision-making details documented in ED Course.  Risk OTC drugs. Prescription drug management. Decision regarding hospitalization. Diagnosis or treatment significantly limited by social determinants of health.         Final Clinical Impression(s) / ED Diagnoses Final diagnoses:  Viral upper respiratory tract infection  Wheeze    Rx / DC Orders ED Discharge Orders          Ordered    methylPREDNISolone (MEDROL DOSEPAK) 4 MG TBPK tablet        12/20/21 1713    albuterol (VENTOLIN HFA) 108 (90 Base) MCG/ACT inhaler  Every 6 hours PRN        12/20/21 1713    benzonatate (TESSALON) 100 MG capsule  Every 8 hours        12/20/21 1713              Kikue Gerhart A, PA-C 12/20/21 1714    Ezequiel Essex, MD 12/20/21 2232

## 2021-12-20 NOTE — ED Triage Notes (Signed)
Patient here from home reporting generalized body aches, fever and cough x1 week.

## 2022-03-13 DIAGNOSIS — Z79899 Other long term (current) drug therapy: Secondary | ICD-10-CM | POA: Diagnosis not present

## 2022-03-14 DIAGNOSIS — R059 Cough, unspecified: Secondary | ICD-10-CM | POA: Diagnosis not present

## 2022-03-14 DIAGNOSIS — E1169 Type 2 diabetes mellitus with other specified complication: Secondary | ICD-10-CM | POA: Diagnosis not present

## 2022-03-14 DIAGNOSIS — J22 Unspecified acute lower respiratory infection: Secondary | ICD-10-CM | POA: Diagnosis not present

## 2022-03-14 DIAGNOSIS — Z6835 Body mass index (BMI) 35.0-35.9, adult: Secondary | ICD-10-CM | POA: Diagnosis not present

## 2022-04-05 DIAGNOSIS — M25561 Pain in right knee: Secondary | ICD-10-CM | POA: Diagnosis not present

## 2022-04-05 DIAGNOSIS — Z79899 Other long term (current) drug therapy: Secondary | ICD-10-CM | POA: Diagnosis not present

## 2022-04-05 DIAGNOSIS — E1169 Type 2 diabetes mellitus with other specified complication: Secondary | ICD-10-CM | POA: Diagnosis not present

## 2022-04-05 DIAGNOSIS — G8929 Other chronic pain: Secondary | ICD-10-CM | POA: Diagnosis not present

## 2022-04-05 DIAGNOSIS — M25562 Pain in left knee: Secondary | ICD-10-CM | POA: Diagnosis not present

## 2022-04-09 DIAGNOSIS — Z79899 Other long term (current) drug therapy: Secondary | ICD-10-CM | POA: Diagnosis not present

## 2022-05-28 DIAGNOSIS — Z79899 Other long term (current) drug therapy: Secondary | ICD-10-CM | POA: Diagnosis not present

## 2022-05-28 DIAGNOSIS — Z6835 Body mass index (BMI) 35.0-35.9, adult: Secondary | ICD-10-CM | POA: Diagnosis not present

## 2022-05-28 DIAGNOSIS — E1169 Type 2 diabetes mellitus with other specified complication: Secondary | ICD-10-CM | POA: Diagnosis not present

## 2022-05-28 DIAGNOSIS — M539 Dorsopathy, unspecified: Secondary | ICD-10-CM | POA: Diagnosis not present

## 2022-05-29 ENCOUNTER — Ambulatory Visit
Admission: EM | Admit: 2022-05-29 | Discharge: 2022-05-29 | Disposition: A | Discharge status: Home / Self Care | Payer: Medicaid Other | Attending: Emergency Medicine | Admitting: Emergency Medicine

## 2022-05-29 DIAGNOSIS — K0889 Other specified disorders of teeth and supporting structures: Secondary | ICD-10-CM

## 2022-05-29 MED ORDER — MELOXICAM 7.5 MG PO TABS: 7.5000 mg | ORAL_TABLET | Freq: Every day | ORAL | 0 refills | Status: DC

## 2022-05-29 MED ORDER — CLINDAMYCIN HCL 150 MG PO CAPS
150.0000 mg | ORAL_CAPSULE | Freq: Three times a day (TID) | ORAL | 0 refills | Status: AC
Start: 2022-05-29 — End: 2022-06-05

## 2022-05-29 NOTE — ED Triage Notes (Signed)
Pt reports she has left side teeth pain x 2 weeks ago. Wants an antibiotic for teeth infection. States its making her head hurt. Does not have a dentist say she cannot go see one until she gets a referral.   Pt understands we cannot do referrals.

## 2022-05-29 NOTE — ED Provider Notes (Signed)
[facility]    CSN: [id number] Arrival date & time: 05/29/22  1658      History   Chief Complaint No chief complaint on file.   HPI [name] is a 52 y.o. female.   Patient presents for evaluation of left lower dental pain occurring for 2 weeks, progressively worsening, has been the next cause left-sided headaches.  Left cheek is painful when touched and having pain with chewing, difficulty tolerating solid foods.  Symptoms exacerbated by cold liquids.  Has attempted use of Tylenol and BC powder.  Denies fever or drainage.  Due to need for dental work but does not have established doctor.    Past Medical History:  Diagnosis Date   Asthma    Chronic back pain    Chronic daily headache    Chronic neck pain    Diabetes mellitus without complication (HCC)    Sciatica    Thyroid disease     Patient Active Problem List   Diagnosis Date Noted   CAP (community acquired pneumonia) 08/06/2015   Hypertension 08/06/2015   Metabolic syndrome 02/24/2015   Obesity 02/24/2015   Metrorrhagia 02/24/2015   Iron deficiency anemia 02/24/2015   Glucose intolerance (impaired glucose tolerance) 02/21/2015   Hypothyroidism 11/24/2014    Past Surgical History:  Procedure Laterality Date   CESAREAN SECTION     CHOLECYSTECTOMY     TUBAL LIGATION      OB History     Gravida  6   Para  4   Term  3   Preterm  1   AB  2   Living  3      SAB  2   IAB      Ectopic      Multiple      Live Births  4            Home Medications    Prior to Admission medications   Medication Sig Start Date End Date Taking? Authorizing Provider  acetaminophen (TYLENOL) 500 MG tablet Take 1 tablet (500 mg total) by mouth every 6 (six) hours as needed for moderate pain. 03/06/19   Lorelee New, PA-C  albuterol (VENTOLIN HFA) 108 (90 Base) MCG/ACT inhaler Inhale 1-2 puffs into the lungs every 6 (six) hours as needed for wheezing or shortness of breath. 12/20/21   Henderly,  Britni A, PA-C  amLODipine (NORVASC) 5 MG tablet Take 1 tablet (5 mg total) by mouth daily. 11/18/18   Linwood Dibbles, MD  benzonatate (TESSALON) 100 MG capsule Take 1 capsule (100 mg total) by mouth every 8 (eight) hours. 12/20/21   Henderly, Britni A, PA-C  cyclobenzaprine (FLEXERIL) 5 MG tablet Take 1 tablet (5 mg total) by mouth 3 (three) times daily as needed for muscle spasms. 01/01/21   Charlynne Pander, MD  diazepam (VALIUM) 5 MG tablet Take 1 tablet (5 mg total) by mouth every 8 (eight) hours as needed for anxiety (muscle spasm). Patient not taking: Reported on 08/14/2017 11/06/16   Rolland Porter, MD  Guaifenesin 1200 MG TB12 Take 1 tablet (1,200 mg total) by mouth 2 (two) times daily. 08/14/17   Lawyer, Cristal Deer, PA-C  HYDROcodone-acetaminophen (NORCO/VICODIN) 5-325 MG tablet Take 1 tablet by mouth every 6 (six) hours as needed. 01/01/21   Charlynne Pander, MD  ibuprofen (ADVIL,MOTRIN) 200 MG tablet Take 800 mg by mouth every 6 (six) hours as needed for moderate pain.    [provider]  meclizine (ANTIVERT) 25 MG tablet Take 1  tablet (25 mg total) by mouth 3 (three) times daily as needed for dizziness. 11/03/16   Rolan Bucco, MD  methylPREDNISolone (MEDROL DOSEPAK) 4 MG TBPK tablet Take as prescribed 12/20/21   Henderly, Britni A, PA-C  naproxen (NAPROSYN) 500 MG tablet Take 1 tablet (500 mg total) by mouth 2 (two) times daily between meals as needed for moderate pain. 03/06/19   Lorelee New, PA-C  ondansetron (ZOFRAN ODT) 4 MG disintegrating tablet Take 1 tablet (4 mg total) by mouth every 8 (eight) hours as needed for nausea. 11/06/16   Rolland Porter, MD  predniSONE (DELTASONE) 50 MG tablet Take 1 tablet (50 mg total) by mouth daily. 08/14/17   Lawyer, Cristal Deer, PA-C    Family History Family History  Problem Relation Age of Onset   Diabetes Mother    Hypertension Mother    Thyroid disease Mother     Social History Social History   Tobacco Use   Smoking status:  Never   Smokeless tobacco: Never  Vaping Use   Vaping Use: Never used  Substance Use Topics   Alcohol use: No   Drug use: No     Allergies   Bactrim [sulfamethoxazole-trimethoprim] and Penicillins   Review of Systems Review of Systems   Physical Exam Triage Vital Signs ED Triage Vitals  Enc Vitals Group     BP 05/29/22 1733 (!) 147/77     Pulse Rate 05/29/22 1733 66     Resp 05/29/22 1733 14     Temp 05/29/22 1733 97.9 F (36.6 C)     Temp Source 05/29/22 1733 Oral     SpO2 05/29/22 1733 98 %     Weight --      Height --      Head Circumference --      Peak Flow --      Pain Score 05/29/22 1735 8     Pain Loc --      Pain Edu? --      Excl. in GC? --    No data found.  Updated Vital Signs BP (!) 147/77 (BP Location: Left Arm)   Pulse 66   Temp 97.9 F (36.6 C) (Oral)   Resp 14   LMP 05/07/2022 (Approximate)   SpO2 98%   Visual Acuity Right Eye Distance:   Left Eye Distance:   Bilateral Distance:    Right Eye Near:   Left Eye Near:    Bilateral Near:     Physical Exam Constitutional:      Appearance: Normal appearance.  HENT:     Mouth/Throat:     Comments: significant dental decay along the upper and lower gumline with mild to moderate lower gingival swelling, pharynx is clear without obstruction, no abscess noted Eyes:     Extraocular Movements: Extraocular movements intact.  Pulmonary:     Effort: Pulmonary effort is normal.  Neurological:     Mental Status: She is alert and oriented to person, place, and time.      UC Treatments / Results  Labs (all labs ordered are listed, but only abnormal results are displayed) Labs Reviewed - No data to display  EKG   Radiology No results found.  Procedures Procedures (including critical care time)  Medications Ordered in UC Medications - No data to display  Initial Impression / Assessment and Plan / UC Course  I have reviewed the triage vital signs and the nursing notes.  Pertinent  labs & imaging results that were available during my care  of the patient were reviewed by me and considered in my medical decision making (see chart for details).  Dental Pain   Will provide coverage for infection based on presentation of the oral cavity, clindamycin prescribed as well as meloxicam for pain management, recommended additional supportive measures and strongly advised follow-up with dentist if possible for further evaluation and management  Final Clinical Impressions(s) / UC Diagnoses   Final diagnoses:  None   Discharge Instructions   None    ED Prescriptions   None    PDMP not reviewed this encounter.   Valinda Hoar, NP 05/29/22 1810

## 2022-05-29 NOTE — Discharge Instructions (Signed)
Today you are being treated for your dental pain  Begin clindamycin every 8 hours for the next 7 days to clear infection  You may use meloxicam once a day as needed for pain management, you may take this in addition to Tylenol, may take them together or alternate the medicines which ever works best  You may attempt salt water gargles, throat lozenges, warm liquids and soft foods as needed for additional comfort  Eventually you will need to find a dentist for further evaluation and management of your teeth

## 2022-05-30 DIAGNOSIS — Z79899 Other long term (current) drug therapy: Secondary | ICD-10-CM | POA: Diagnosis not present

## 2022-06-26 DIAGNOSIS — E559 Vitamin D deficiency, unspecified: Secondary | ICD-10-CM | POA: Diagnosis not present

## 2022-06-26 DIAGNOSIS — M539 Dorsopathy, unspecified: Secondary | ICD-10-CM | POA: Diagnosis not present

## 2022-06-26 DIAGNOSIS — K047 Periapical abscess without sinus: Secondary | ICD-10-CM | POA: Diagnosis not present

## 2022-06-26 DIAGNOSIS — Z6835 Body mass index (BMI) 35.0-35.9, adult: Secondary | ICD-10-CM | POA: Diagnosis not present

## 2022-06-26 DIAGNOSIS — Z79899 Other long term (current) drug therapy: Secondary | ICD-10-CM | POA: Diagnosis not present

## 2022-06-26 DIAGNOSIS — I10 Essential (primary) hypertension: Secondary | ICD-10-CM | POA: Diagnosis not present

## 2022-06-26 DIAGNOSIS — M62838 Other muscle spasm: Secondary | ICD-10-CM | POA: Diagnosis not present

## 2022-06-26 DIAGNOSIS — E039 Hypothyroidism, unspecified: Secondary | ICD-10-CM | POA: Diagnosis not present

## 2022-06-26 DIAGNOSIS — E1169 Type 2 diabetes mellitus with other specified complication: Secondary | ICD-10-CM | POA: Diagnosis not present

## 2022-06-26 DIAGNOSIS — E785 Hyperlipidemia, unspecified: Secondary | ICD-10-CM | POA: Diagnosis not present

## 2022-07-26 DIAGNOSIS — Z79899 Other long term (current) drug therapy: Secondary | ICD-10-CM | POA: Diagnosis not present

## 2023-01-06 ENCOUNTER — Emergency Department (HOSPITAL_COMMUNITY)
Admission: EM | Admit: 2023-01-06 | Discharge: 2023-01-06 | Disposition: A | Discharge status: Home / Self Care | Payer: Medicaid Other | Attending: Emergency Medicine | Admitting: Emergency Medicine

## 2023-01-06 ENCOUNTER — Encounter (HOSPITAL_COMMUNITY): Payer: Self-pay

## 2023-01-06 ENCOUNTER — Other Ambulatory Visit: Payer: Self-pay

## 2023-01-06 DIAGNOSIS — Z79899 Other long term (current) drug therapy: Secondary | ICD-10-CM | POA: Insufficient documentation

## 2023-01-06 DIAGNOSIS — J069 Acute upper respiratory infection, unspecified: Secondary | ICD-10-CM

## 2023-01-06 DIAGNOSIS — U071 COVID-19: Secondary | ICD-10-CM | POA: Diagnosis not present

## 2023-01-06 DIAGNOSIS — R059 Cough, unspecified: Secondary | ICD-10-CM | POA: Diagnosis present

## 2023-01-06 LAB — RESP PANEL BY RT-PCR (RSV, FLU A&B, COVID)  RVPGX2
Influenza A by PCR: NEGATIVE
Influenza B by PCR: NEGATIVE
Resp Syncytial Virus by PCR: NEGATIVE
SARS Coronavirus 2 by RT PCR: POSITIVE — AB

## 2023-01-06 MED ORDER — ACETAMINOPHEN 325 MG PO TABS
650.0000 mg | ORAL_TABLET | Freq: Once | ORAL | Status: AC
Start: 2023-01-06 — End: 2023-01-06
  Administered 2023-01-06: 650 mg via ORAL
  Filled 2023-01-06: qty 2

## 2023-01-06 MED ORDER — LIDOCAINE VISCOUS HCL 2 % MT SOLN
15.0000 mL | Freq: Once | OROMUCOSAL | Status: AC
  Administered 2023-01-06: 15 mL via OROMUCOSAL
  Filled 2023-01-06: qty 15

## 2023-01-06 NOTE — ED Triage Notes (Signed)
Pt. Arrives POV for nasal congestion, sore throat and a cough since Friday. States that she is coughing up green mucus and has noticed on and off fevers.

## 2023-01-06 NOTE — Discharge Instructions (Addendum)
Please follow-up with your PCP as needed. Return to the ED 2/2 to O2 saturations less than 90% or severe shortness of breath or chest pain.

## 2023-01-06 NOTE — ED Provider Notes (Signed)
[place] [facility] AT [facility] Provider Note   CSN: [id number] Arrival date & time: 01/06/23  2952     History  Chief Complaint  Patient presents with   Nasal Congestion    [name] is a 52 y.o. female, no pertinent past medical history, who presents to the ED secondary to sore throat, slight productive cough, and fever that has been going on for the last 2 to 3 days.  Is any headache, vision changes, intractable nausea, vomiting.  No chest pain or shortness of breath.  Home Medications Prior to Admission medications   Medication Sig Start Date End Date Taking? Authorizing Provider  acetaminophen (TYLENOL) 500 MG tablet Take 1 tablet (500 mg total) by mouth every 6 (six) hours as needed for moderate pain. 03/06/19   Lorelee New, PA-C  albuterol (VENTOLIN HFA) 108 (90 Base) MCG/ACT inhaler Inhale 1-2 puffs into the lungs every 6 (six) hours as needed for wheezing or shortness of breath. 12/20/21   Henderly, Britni A, PA-C  amLODipine (NORVASC) 5 MG tablet Take 1 tablet (5 mg total) by mouth daily. 11/18/18   Linwood Dibbles, MD  benzonatate (TESSALON) 100 MG capsule Take 1 capsule (100 mg total) by mouth every 8 (eight) hours. 12/20/21   Henderly, Britni A, PA-C  cyclobenzaprine (FLEXERIL) 5 MG tablet Take 1 tablet (5 mg total) by mouth 3 (three) times daily as needed for muscle spasms. 01/01/21   Charlynne Pander, MD  diazepam (VALIUM) 5 MG tablet Take 1 tablet (5 mg total) by mouth every 8 (eight) hours as needed for anxiety (muscle spasm). Patient not taking: Reported on 08/14/2017 11/06/16   Rolland Porter, MD  Guaifenesin 1200 MG TB12 Take 1 tablet (1,200 mg total) by mouth 2 (two) times daily. 08/14/17   Lawyer, Cristal Deer, PA-C  HYDROcodone-acetaminophen (NORCO/VICODIN) 5-325 MG tablet Take 1 tablet by mouth every 6 (six) hours as needed. 01/01/21   Charlynne Pander, MD  ibuprofen (ADVIL,MOTRIN) 200 MG tablet Take 800 mg by mouth every 6 (six) hours as  needed for moderate pain.    [provider]  meclizine (ANTIVERT) 25 MG tablet Take 1 tablet (25 mg total) by mouth 3 (three) times daily as needed for dizziness. 11/03/16   Rolan Bucco, MD  meloxicam (MOBIC) 7.5 MG tablet Take 1 tablet (7.5 mg total) by mouth daily. 05/29/22   Valinda Hoar, NP  methylPREDNISolone (MEDROL DOSEPAK) 4 MG TBPK tablet Take as prescribed 12/20/21   Henderly, Britni A, PA-C  naproxen (NAPROSYN) 500 MG tablet Take 1 tablet (500 mg total) by mouth 2 (two) times daily between meals as needed for moderate pain. 03/06/19   Lorelee New, PA-C  ondansetron (ZOFRAN ODT) 4 MG disintegrating tablet Take 1 tablet (4 mg total) by mouth every 8 (eight) hours as needed for nausea. 11/06/16   Rolland Porter, MD  predniSONE (DELTASONE) 50 MG tablet Take 1 tablet (50 mg total) by mouth daily. 08/14/17   Lawyer, Cristal Deer, PA-C      Allergies    Bactrim [sulfamethoxazole-trimethoprim] and Penicillins    Review of Systems   Review of Systems  HENT:  Positive for rhinorrhea and sore throat.   Respiratory:  Negative for shortness of breath.     Physical Exam Updated Vital Signs BP (!) 177/94 (BP Location: Left Arm)   Pulse 82   Temp 98.5 F (36.9 C) (Oral)   Resp 18   SpO2 100%  Physical Exam Vitals and nursing note reviewed.  Constitutional:      General: She is not in acute distress.    Appearance: She is well-developed.  HENT:     Head: Normocephalic and atraumatic.     Right Ear: Tympanic membrane normal.     Left Ear: Tympanic membrane normal.     Nose: Nose normal.     Mouth/Throat:     Mouth: Mucous membranes are moist.     Pharynx: Posterior oropharyngeal erythema present. No oropharyngeal exudate.  Eyes:     Conjunctiva/sclera: Conjunctivae normal.  Cardiovascular:     Rate and Rhythm: Normal rate and regular rhythm.     Heart sounds: No murmur heard. Pulmonary:     Effort: Pulmonary effort is normal. No respiratory distress.     Breath  sounds: Normal breath sounds.  Abdominal:     Palpations: Abdomen is soft.     Tenderness: There is no abdominal tenderness.  Musculoskeletal:        General: No swelling.     Cervical back: Neck supple.  Skin:    General: Skin is warm and dry.     Capillary Refill: Capillary refill takes less than 2 seconds.  Neurological:     Mental Status: She is alert.  Psychiatric:        Mood and Affect: Mood normal.     ED Results / Procedures / Treatments   Labs (all labs ordered are listed, but only abnormal results are displayed) Labs Reviewed  RESP PANEL BY RT-PCR (RSV, FLU A&B, COVID)  RVPGX2 - Abnormal; Notable for the following components:      Result Value   SARS Coronavirus 2 by RT PCR POSITIVE (*)    All other components within normal limits    EKG None  Radiology No results found.  Procedures Procedures    Medications Ordered in ED Medications  lidocaine (XYLOCAINE) 2 % viscous mouth solution 15 mL (15 mLs Mouth/Throat Given 01/06/23 0725)  acetaminophen (TYLENOL) tablet 650 mg (650 mg Oral Given 01/06/23 0725)    ED Course/ Medical Decision Making/ A&P                                 Medical Decision Making Patient is a 52 year old female, here for sore throat, productive cough, and fevers that have been going on for the last 2 to 3 days.  Denies any shortness of breath or chest pain.  We will obtain a COVID swab for further evaluation.  She does not have exudate on her tonsils, and they are 1+.  Centor low risk.  Lidocaine for sore throat  Amount and/or Complexity of Data Reviewed Discussion of management or test interpretation with external provider(s): Patient found to be COVID-positive, she is not hypoxic, afebrile at this time, and overall well-appearing.  Her oropharynx, is well-appearing, just lightly erythematous.  We will discharge her home with strict return precautions and instructions to take Tylenol and ibuprofen for headache, and then  quarantine.  Risk OTC drugs. Prescription drug management.  Final Clinical Impression(s) / ED Diagnoses Final diagnoses:  COVID-19  Viral upper respiratory tract infection    Rx / DC Orders ED Discharge Orders     None         Shahrzad Koble, Harley [place], PA 01/06/23 7846    Arby Barrette, MD 01/06/23 [phone]

## 2024-01-19 ENCOUNTER — Ambulatory Visit: Admission: EM | Admit: 2024-01-19 | Discharge: 2024-01-19 | Disposition: A | Discharge status: Home / Self Care

## 2024-01-19 ENCOUNTER — Encounter: Payer: Self-pay | Admitting: *Deleted

## 2024-01-19 ENCOUNTER — Ambulatory Visit (INDEPENDENT_AMBULATORY_CARE_PROVIDER_SITE_OTHER)

## 2024-01-19 DIAGNOSIS — R6889 Other general symptoms and signs: Secondary | ICD-10-CM | POA: Diagnosis not present

## 2024-01-19 DIAGNOSIS — J4521 Mild intermittent asthma with (acute) exacerbation: Secondary | ICD-10-CM

## 2024-01-19 MED ORDER — PREDNISONE 20 MG PO TABS: 40.0000 mg | ORAL_TABLET | Freq: Every day | ORAL | 0 refills | Status: DC

## 2024-01-19 MED ORDER — PREDNISONE 20 MG PO TABS: 40.0000 mg | ORAL_TABLET | Freq: Every day | ORAL | 0 refills | Status: AC

## 2024-01-19 MED ORDER — ALBUTEROL SULFATE HFA 108 (90 BASE) MCG/ACT IN AERS: 1.0000 | INHALATION_SPRAY | Freq: Four times a day (QID) | RESPIRATORY_TRACT | 0 refills | Status: AC | PRN

## 2024-01-19 MED ORDER — ALBUTEROL SULFATE HFA 108 (90 BASE) MCG/ACT IN AERS: 1.0000 | INHALATION_SPRAY | Freq: Four times a day (QID) | RESPIRATORY_TRACT | 0 refills | Status: DC | PRN

## 2024-01-19 NOTE — ED Provider Notes (Signed)
 " [facility]    CSN: [id number] Arrival date & time: 01/19/24  1539      History   Chief Complaint Chief Complaint  Patient presents with   Cough    HPI [name] is a 53 y.o. female presenting for flu-like illness x1 week.  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 She has had cough, fever, runny nose, body aches x 1 week. States nasal congestion is persistent, but denies thick nasal congestion or facial pain. Concerned for pneumonia given history of this.  History of pneumonia, and she is out of her albuterol  inhaler.  She needs a note for work    HPI  Past Medical History:  Diagnosis Date   Asthma    Chronic back pain    Chronic daily headache    Chronic neck pain    Diabetes mellitus without complication (HCC)    Sciatica    Thyroid  disease     Patient Active Problem List   Diagnosis Date Noted   CAP (community acquired pneumonia) 08/06/2015   Hypertension 08/06/2015   Metabolic syndrome 02/24/2015   Obesity 02/24/2015   Metrorrhagia 02/24/2015   Iron deficiency anemia 02/24/2015   Glucose intolerance (impaired glucose tolerance) 02/21/2015   Hypothyroidism 11/24/2014    Past Surgical History:  Procedure Laterality Date   CESAREAN SECTION     CHOLECYSTECTOMY     TUBAL LIGATION      OB History     Gravida  6   Para  4   Term  3   Preterm  1   AB  2   Living  3      SAB  2   IAB      Ectopic      Multiple      Live Births  4            Home Medications    Prior to Admission medications  Medication Sig Start Date End Date Taking? Authorizing Provider  acetaminophen  (TYLENOL ) 500 MG tablet Take 1 tablet (500 mg total) by mouth every 6 (six) hours as needed for moderate pain. 03/06/19  Yes Landy Honora CROME, PA-C  diclofenac  (VOLTAREN ) 75 MG EC tablet Take 75 mg by mouth 2 (two) times daily as needed. 11/18/23  Yes [provider]  FARXIGA 5 MG TABS tablet Take 5 mg by mouth daily. 01/19/24  Yes [provider]  furosemide (LASIX) 40 MG tablet Take 40 mg by mouth daily. 01/15/24  Yes [provider]  ibuprofen  (ADVIL ,MOTRIN ) 200 MG tablet Take 800 mg by mouth every 6 (six) hours as needed for moderate pain.   Yes [provider]  levothyroxine  (SYNTHROID ) 50 MCG tablet Take 50 mcg by mouth every morning. 12/24/23  Yes [provider]  metFORMIN (GLUCOPHAGE) 500 MG tablet Take 500 mg by mouth 2 (two) times daily. 09/13/23  Yes [provider]  naloxone [facility]) nasal spray 4 mg/0.1 mL SMARTSIG:1-2 Spray(s) Both Nares 12/24/23  Yes [provider]  oxyCODONE  (OXY IR/ROXICODONE ) 5 MG immediate release tablet Take 5 mg by mouth 4 (four) times daily as needed. 12/24/23  Yes [provider]  albuterol  (VENTOLIN  HFA) 108 (90 Base) MCG/ACT inhaler Inhale 1-2 puffs into the lungs every 6 (six) hours as needed for wheezing or shortness of breath. 01/19/24   Eyva Califano E, PA-C  predniSONE  (DELTASONE ) 20 MG tablet Take 2 tablets (40 mg total) by mouth daily for 5 days. Take with breakfast or lunch.  Avoid NSAIDs (ibuprofen , etc) while taking this medication. 01/19/24 01/24/24  Arlyss Leita BRAVO, PA-C    Family History Family History  Problem Relation Age of Onset   Diabetes Mother    Hypertension Mother    Thyroid  disease Mother     Social History Social History[1]   Allergies   Bactrim [sulfamethoxazole-trimethoprim] and Penicillins   Review of Systems Review of Systems  Constitutional:  Positive for chills, fatigue and fever. Negative for appetite change.  HENT:  Positive for congestion. Negative for ear pain, rhinorrhea, sinus pressure, sinus pain and sore throat.   Eyes:  Negative for redness and visual disturbance.  Respiratory:  Positive for cough and wheezing. Negative for chest tightness and shortness of breath.   Cardiovascular:  Negative for chest pain and palpitations.  Gastrointestinal:  Negative for abdominal pain,  constipation, diarrhea, nausea and vomiting.  Genitourinary:  Negative for dysuria, frequency and urgency.  Musculoskeletal:  Positive for myalgias.  Neurological:  Negative for dizziness, weakness and headaches.  Psychiatric/Behavioral:  Negative for confusion.   All other systems reviewed and are negative.    Physical Exam Triage Vital Signs ED Triage Vitals  Encounter Vitals Group     BP 01/19/24 1725 (!) 146/88     Girls Systolic BP Percentile --      Girls Diastolic BP Percentile --      Boys Systolic BP Percentile --      Boys Diastolic BP Percentile --      Pulse Rate 01/19/24 1725 74     Resp 01/19/24 1725 18     Temp 01/19/24 1725 98 F (36.7 C)     Temp Source 01/19/24 1725 Oral     SpO2 01/19/24 1725 98 %     Weight --      Height --      Head Circumference --      Peak Flow --      Pain Score 01/19/24 1721 5     Pain Loc --      Pain Education --      Exclude from Growth Chart --    No data found.  Updated Vital Signs BP (!) 146/88 (BP Location: Left Arm)   Pulse 74   Temp 98 F (36.7 C) (Oral)   Resp 18   SpO2 98%   Visual Acuity Right Eye Distance:   Left Eye Distance:   Bilateral Distance:    Right Eye Near:   Left Eye Near:    Bilateral Near:     Physical Exam Vitals reviewed.  Constitutional:      General: She is not in acute distress.    Appearance: Normal appearance. She is not ill-appearing.  HENT:     Head: Normocephalic and atraumatic.     Right Ear: Tympanic membrane, ear canal and external ear normal. No tenderness. No middle ear effusion. There is no impacted cerumen. Tympanic membrane is not perforated, erythematous, retracted or bulging.     Left Ear: Tympanic membrane, ear canal and external ear normal. No tenderness.  No middle ear effusion. There is no impacted cerumen. Tympanic membrane is not perforated, erythematous, retracted or bulging.     Nose: Nose normal. No congestion.     Mouth/Throat:     Mouth: Mucous membranes  are moist.     Pharynx: Uvula midline. No oropharyngeal exudate or posterior oropharyngeal erythema.     Tonsils: No tonsillar exudate.  Eyes:     Extraocular Movements: Extraocular movements intact.  Pupils: Pupils are equal, round, and reactive to light.  Cardiovascular:     Rate and Rhythm: Normal rate and regular rhythm.     Heart sounds: Normal heart sounds.  Pulmonary:     Effort: Pulmonary effort is normal.     Breath sounds: Normal breath sounds. No decreased breath sounds, wheezing, rhonchi or rales.  Abdominal:     Palpations: Abdomen is soft.     Tenderness: There is no abdominal tenderness. There is no guarding or rebound.  Lymphadenopathy:     Cervical: No cervical adenopathy.     Right cervical: No superficial, deep or posterior cervical adenopathy.    Left cervical: No superficial, deep or posterior cervical adenopathy.  Skin:    Comments: No rash   Neurological:     General: No focal deficit present.     Mental Status: She is alert and oriented to person, place, and time.  Psychiatric:        Mood and Affect: Mood normal.        Behavior: Behavior normal.        Thought Content: Thought content normal.        Judgment: Judgment normal.      UC Treatments / Results  Labs (all labs ordered are listed, but only abnormal results are displayed) Labs Reviewed - No data to display  EKG   Radiology DG Chest 2 View Result Date: 01/19/2024 CLINICAL DATA:  Cough.  Concern for pneumonia. EXAM: CHEST - 2 VIEW COMPARISON:  Chest CT dated 12/20/2021. FINDINGS: No focal consolidation, pleural effusion or pneumothorax. The cardiac silhouette is within limits. No acute osseous pathology. IMPRESSION: No active cardiopulmonary disease. Electronically Signed   By: Vanetta Chou M.D.   On: 01/19/2024 18:10    Procedures Procedures (including critical care time)  Medications Ordered in UC Medications - No data to display  Initial Impression / Assessment and Plan /  UC Course  I have reviewed the triage vital signs and the nursing notes.  Pertinent labs & imaging results that were available during my care of the patient were reviewed by me and considered in my medical decision making (see chart for details).     Patient is a pleasant 53 y.o. female presenting with exacerbation of mild intermittent asthma following flulike illness. The patient is afebrile and nontachycardic.  Antipyretic has not been administered today. Lungs are clear to auscultation.  Did not check a COVID or influenza test due to duration of symptoms. H/o CAP. CXR: No active cardiopulmonary disease.  Albuterol  sent. Prednisone  burst sent. She is not a diabetic.  -Coding Level 4 for acute exacerbation of chronic condition and prescription drug management.   Final Clinical Impressions(s) / UC Diagnoses   Final diagnoses:  Flu-like symptoms  Mild intermittent asthma with (acute) exacerbation     Discharge Instructions      - Your lung x-ray looks normal to me.  I will call in 1 to 2 hours if the radiologist sees something that I did not.  If you do not get a call, the x-ray was normal.    -Albuterol  inhaler as needed for cough, wheezing, shortness of breath, 1 to 2 puffs every 6 hours as needed. -Prednisone , 2 pills taken at the same time for 5 days in a row.  Try taking this earlier in the day as it can give you energy. Avoid NSAIDs like ibuprofen  and alleve while taking this medication as they can increase your risk of stomach upset and even  GI bleeding when in combination with a steroid. You can continue tylenol  (acetaminophen ) up to 1000mg  3x daily.      ED Prescriptions     Medication Sig Dispense Auth. Provider   albuterol  (VENTOLIN  HFA) 108 (90 Base) MCG/ACT inhaler  (Status: Discontinued) Inhale 1-2 puffs into the lungs every 6 (six) hours as needed for wheezing or shortness of breath. 1 each Selah Klang E, PA-C   predniSONE  (DELTASONE ) 20 MG tablet  (Status:  Discontinued) Take 2 tablets (40 mg total) by mouth daily for 5 days. Take with breakfast or lunch. Avoid NSAIDs (ibuprofen , etc) while taking this medication. 10 tablet Elmarie Devlin E, PA-C   albuterol  (VENTOLIN  HFA) 108 (90 Base) MCG/ACT inhaler Inhale 1-2 puffs into the lungs every 6 (six) hours as needed for wheezing or shortness of breath. 1 each Arlyss Leita BRAVO, PA-C   predniSONE  (DELTASONE ) 20 MG tablet Take 2 tablets (40 mg total) by mouth daily for 5 days. Take with breakfast or lunch. Avoid NSAIDs (ibuprofen , etc) while taking this medication. 10 tablet Dugan Vanhoesen E, PA-C      PDMP not reviewed this encounter.     [1]  Social History Tobacco Use   Smoking status: Never   Smokeless tobacco: Never  Vaping Use   Vaping status: Never Used  Substance Use Topics   Alcohol use: No   Drug use: No     Arlyss Leita BRAVO, PA-C 01/19/24 1815  "

## 2024-01-19 NOTE — Discharge Instructions (Addendum)
-   Your lung x-ray looks normal to me.  I will call in 1 to 2 hours if the radiologist sees something that I did not.  If you do not get a call, the x-ray was normal.    -Albuterol  inhaler as needed for cough, wheezing, shortness of breath, 1 to 2 puffs every 6 hours as needed. -Prednisone , 2 pills taken at the same time for 5 days in a row.  Try taking this earlier in the day as it can give you energy. Avoid NSAIDs like ibuprofen  and alleve while taking this medication as they can increase your risk of stomach upset and even GI bleeding when in combination with a steroid. You can continue tylenol  (acetaminophen ) up to 1000mg  3x daily.

## 2024-01-19 NOTE — ED Triage Notes (Signed)
 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 She has had cough, fever, runny nose, body aches x 1 week. She needs a note for work
